# Patient Record
Sex: Female | Born: 1995 | Race: White | Hispanic: No | Marital: Single | Smoking: Current every day smoker
Health system: Southern US, Community
[De-identification: ages and names within clinical notes are randomized; demographics above are authoritative.]

## PROBLEM LIST (undated history)

## (undated) ENCOUNTER — Emergency Department (HOSPITAL_COMMUNITY): Payer: Medicaid Other | Source: Home / Self Care

## (undated) DIAGNOSIS — A749 Chlamydial infection, unspecified: Secondary | ICD-10-CM

## (undated) DIAGNOSIS — B1920 Unspecified viral hepatitis C without hepatic coma: Secondary | ICD-10-CM

## (undated) DIAGNOSIS — F99 Mental disorder, not otherwise specified: Secondary | ICD-10-CM

## (undated) DIAGNOSIS — F419 Anxiety disorder, unspecified: Secondary | ICD-10-CM

## (undated) DIAGNOSIS — M1990 Unspecified osteoarthritis, unspecified site: Secondary | ICD-10-CM

## (undated) HISTORY — PX: BLADDER SURGERY: SHX569

## (undated) HISTORY — DX: Mental disorder, not otherwise specified: F99

## (undated) HISTORY — DX: Unspecified viral hepatitis C without hepatic coma: B19.20

## (undated) HISTORY — PX: HERNIA REPAIR: SHX51

---
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
## 2013-05-06 ENCOUNTER — Encounter (HOSPITAL_COMMUNITY): Payer: Self-pay | Admitting: *Deleted

## 2013-05-06 ENCOUNTER — Emergency Department (HOSPITAL_COMMUNITY)
Admission: EM | Admit: 2013-05-06 | Discharge: 2013-05-06 | Disposition: A | Discharge status: Home / Self Care | Payer: Medicaid Other | Attending: Emergency Medicine | Admitting: Emergency Medicine

## 2013-05-06 DIAGNOSIS — R1013 Epigastric pain: Secondary | ICD-10-CM | POA: Insufficient documentation

## 2013-05-06 DIAGNOSIS — F17200 Nicotine dependence, unspecified, uncomplicated: Secondary | ICD-10-CM | POA: Insufficient documentation

## 2013-05-06 DIAGNOSIS — Z8739 Personal history of other diseases of the musculoskeletal system and connective tissue: Secondary | ICD-10-CM | POA: Insufficient documentation

## 2013-05-06 DIAGNOSIS — Z3202 Encounter for pregnancy test, result negative: Secondary | ICD-10-CM | POA: Insufficient documentation

## 2013-05-06 DIAGNOSIS — R6883 Chills (without fever): Secondary | ICD-10-CM | POA: Insufficient documentation

## 2013-05-06 DIAGNOSIS — Z8639 Personal history of other endocrine, nutritional and metabolic disease: Secondary | ICD-10-CM | POA: Insufficient documentation

## 2013-05-06 DIAGNOSIS — F411 Generalized anxiety disorder: Secondary | ICD-10-CM | POA: Insufficient documentation

## 2013-05-06 DIAGNOSIS — R1110 Vomiting, unspecified: Secondary | ICD-10-CM

## 2013-05-06 DIAGNOSIS — Z862 Personal history of diseases of the blood and blood-forming organs and certain disorders involving the immune mechanism: Secondary | ICD-10-CM | POA: Insufficient documentation

## 2013-05-06 LAB — CBC WITH DIFFERENTIAL/PLATELET
Eosinophils Absolute: 0.2 10*3/uL (ref 0.0–0.7)
Eosinophils Relative: 2 % (ref 0–5)
Hemoglobin: 15.7 g/dL — ABNORMAL HIGH (ref 12.0–15.0)
Lymphs Abs: 1.7 10*3/uL (ref 0.7–4.0)
MCH: 29.6 pg (ref 26.0–34.0)
MCHC: 35 g/dL (ref 30.0–36.0)
MCV: 84.5 fL (ref 78.0–100.0)
Monocytes Relative: 6 % (ref 3–12)
RBC: 5.3 MIL/uL — ABNORMAL HIGH (ref 3.87–5.11)

## 2013-05-06 LAB — URINALYSIS, ROUTINE W REFLEX MICROSCOPIC
Protein, ur: NEGATIVE mg/dL
Urobilinogen, UA: 0.2 mg/dL (ref 0.0–1.0)

## 2013-05-06 LAB — URINE MICROSCOPIC-ADD ON

## 2013-05-06 LAB — COMPREHENSIVE METABOLIC PANEL
BUN: 11 mg/dL (ref 6–23)
Calcium: 9.6 mg/dL (ref 8.4–10.5)
Creatinine, Ser: 0.75 mg/dL (ref 0.50–1.10)
GFR calc Af Amer: >90 mL/min (ref >90)
Glucose, Bld: 84 mg/dL (ref 70–99)
Total Protein: 8 g/dL (ref 6.0–8.3)

## 2013-05-06 LAB — LIPASE, BLOOD: Lipase: 20 U/L (ref 11–59)

## 2013-05-06 LAB — PREGNANCY, URINE: Preg Test, Ur: NEGATIVE

## 2013-05-06 MED ORDER — SODIUM CHLORIDE 0.9 % IV BOLUS (SEPSIS)
1000.0000 mL | Freq: Once | INTRAVENOUS | Status: AC
  Administered 2013-05-06: 1000 mL via INTRAVENOUS

## 2013-05-06 MED ORDER — RANITIDINE HCL 150 MG PO CAPS: 150.0000 mg | ORAL_CAPSULE | Freq: Two times a day (BID) | ORAL | Status: DC

## 2013-05-06 MED ORDER — ONDANSETRON HCL 4 MG/2ML IJ SOLN
4.0000 mg | Freq: Once | INTRAMUSCULAR | Status: AC
  Administered 2013-05-06: 4 mg via INTRAVENOUS
  Filled 2013-05-06: qty 2

## 2013-05-06 MED ORDER — PROMETHAZINE HCL 25 MG PO TABS: 25.0000 mg | ORAL_TABLET | Freq: Four times a day (QID) | ORAL | Status: DC | PRN

## 2013-05-06 MED ORDER — PANTOPRAZOLE SODIUM 40 MG IV SOLR
40.0000 mg | Freq: Once | INTRAVENOUS | Status: AC
  Administered 2013-05-06: 40 mg via INTRAVENOUS
  Filled 2013-05-06: qty 40

## 2013-05-06 NOTE — ED Provider Notes (Signed)
CSN: [id number]     Arrival date & time 05/06/13  1411 History    This chart was scribed for [name], MD by Blanchard Kelch, ED Scribe. The patient was seen in room APA12/APA12. Patient's care was started at 2:30 PM.     Chief Complaint  Patient presents with  . Emesis   Patient is a 17 y.o. female presenting with vomiting.  Emesis Severity:  Moderate Duration:  1 day Quality:  Stomach contents Progression:  Unchanged Chronicity:  New Context comment:  Began this morning after eating steak last night Associated symptoms: abdominal pain and chills   Associated symptoms: no diarrhea and no fever     HPI Comments: Jacqueline Ellison is a 17 y.o. female who presents to the Emergency Department complaining of constant, moderate emesis with associated mild epigastric abdominal pain and chills that began this morning. Patient ate a steak last night and believes that is the cause for emesis. She denies any other contacts eating the same steak. Patient denies diarrhea or fever. Patient has not had a regular menstrual period in four years.    Past Medical History  Diagnosis Date  . Anxiety   . Arthritis   . Thyroid disease    Past Surgical History  Procedure Laterality Date  . Hernia repair     Family History  Problem Relation Age of Onset  . Hypertension Mother   . Diabetes Mother    History  Substance Use Topics  . Smoking status: Current Every Day Smoker  . Smokeless tobacco: Not on file  . Alcohol Use: No   OB History   Grav Para Term Preterm Abortions TAB SAB Ect Mult Living                 Review of Systems  Constitutional: Positive for chills. Negative for fever, appetite change and fatigue.  HENT: Negative for congestion, sinus pressure and ear discharge.   Eyes: Negative for discharge.  Respiratory: Negative for cough.   Cardiovascular: Negative for chest pain.  Gastrointestinal: Positive for vomiting and abdominal pain. Negative for diarrhea.   Genitourinary: Negative for frequency and hematuria.  Musculoskeletal: Negative for back pain.  Skin: Negative for rash.  Neurological: Negative for seizures.  Psychiatric/Behavioral: Negative for hallucinations.    Allergies  Review of patient's allergies indicates no known allergies.  Home Medications   Current Outpatient Rx  Name  Route  Sig  Dispense  Refill  . ALPRAZolam (XANAX) 0.25 MG tablet   Oral   Take 0.25 mg by mouth daily as needed. Nerves/anxiety          Triage Vitals: BP 119/71  Pulse 68  Temp(Src) 97.4 F (36.3 C) (Oral)  Resp 18  Ht 5\' 1"  (1.549 m)  Wt 118 lb (53.524 kg)  BMI 22.31 kg/m2  SpO2 99%  Physical Exam  Nursing note and vitals reviewed. Constitutional: She is oriented to person, place, and time. She appears well-developed.  HENT:  Head: Normocephalic.  Eyes: Conjunctivae and EOM are normal. No scleral icterus.  Neck: Neck supple. No thyromegaly present.  Cardiovascular: Normal rate and regular rhythm.  Exam reveals no gallop and no friction rub.   No murmur heard. Pulmonary/Chest: No stridor. She has no wheezes. She has no rales. She exhibits no tenderness.  Abdominal: She exhibits no distension. There is tenderness. There is no rebound.  Minimal epigastric tenderness  Musculoskeletal: Normal range of motion. She exhibits no edema.  Lymphadenopathy:    She has no  cervical adenopathy.  Neurological: She is oriented to person, place, and time. Coordination normal.  Skin: No rash noted. No erythema.  Psychiatric: She has a normal mood and affect. Her behavior is normal.    ED Course   DIAGNOSTIC STUDIES:  Oxygen Saturation is 99% on room air, normal by my interpretation.    COORDINATION OF CARE:  2:32 PM - Patient verbalizes understanding and agrees with treatment plan.   Procedures (including critical care time)  Labs Reviewed - No data to display No results found. No diagnosis found.  MDM   The chart was scribed for me  under my direct supervision.  I personally performed the history, physical, and medical decision making and all procedures in the evaluation of this patient.[name], MD 05/06/13 [phone]

## 2013-05-06 NOTE — ED Notes (Signed)
Pt's family member at desk stating pt is feeling better and wants to go home. EDP aware.

## 2013-05-06 NOTE — ED Notes (Signed)
Epigastric pain with vomiting starting this morning.  Denies diarrhea.

## 2013-05-08 LAB — URINE CULTURE: Colony Count: 75000

## 2013-05-28 ENCOUNTER — Encounter (HOSPITAL_COMMUNITY): Payer: Self-pay | Admitting: Emergency Medicine

## 2013-05-28 ENCOUNTER — Emergency Department (HOSPITAL_COMMUNITY)
Admission: EM | Admit: 2013-05-28 | Discharge: 2013-05-28 | Disposition: A | Discharge status: Home / Self Care | Payer: Medicaid Other | Attending: Emergency Medicine | Admitting: Emergency Medicine

## 2013-05-28 DIAGNOSIS — Z8639 Personal history of other endocrine, nutritional and metabolic disease: Secondary | ICD-10-CM | POA: Insufficient documentation

## 2013-05-28 DIAGNOSIS — N390 Urinary tract infection, site not specified: Secondary | ICD-10-CM

## 2013-05-28 DIAGNOSIS — N898 Other specified noninflammatory disorders of vagina: Secondary | ICD-10-CM | POA: Insufficient documentation

## 2013-05-28 DIAGNOSIS — R350 Frequency of micturition: Secondary | ICD-10-CM | POA: Insufficient documentation

## 2013-05-28 DIAGNOSIS — F17200 Nicotine dependence, unspecified, uncomplicated: Secondary | ICD-10-CM | POA: Insufficient documentation

## 2013-05-28 DIAGNOSIS — R3915 Urgency of urination: Secondary | ICD-10-CM | POA: Insufficient documentation

## 2013-05-28 DIAGNOSIS — Z8659 Personal history of other mental and behavioral disorders: Secondary | ICD-10-CM | POA: Insufficient documentation

## 2013-05-28 DIAGNOSIS — Z8739 Personal history of other diseases of the musculoskeletal system and connective tissue: Secondary | ICD-10-CM | POA: Insufficient documentation

## 2013-05-28 DIAGNOSIS — Z9889 Other specified postprocedural states: Secondary | ICD-10-CM | POA: Insufficient documentation

## 2013-05-28 DIAGNOSIS — Z79899 Other long term (current) drug therapy: Secondary | ICD-10-CM | POA: Insufficient documentation

## 2013-05-28 DIAGNOSIS — Z3202 Encounter for pregnancy test, result negative: Secondary | ICD-10-CM | POA: Insufficient documentation

## 2013-05-28 DIAGNOSIS — Z862 Personal history of diseases of the blood and blood-forming organs and certain disorders involving the immune mechanism: Secondary | ICD-10-CM | POA: Insufficient documentation

## 2013-05-28 LAB — URINALYSIS, ROUTINE W REFLEX MICROSCOPIC
Glucose, UA: NEGATIVE mg/dL
Specific Gravity, Urine: 1.025 (ref 1.005–1.030)
pH: 7 (ref 5.0–8.0)

## 2013-05-28 LAB — URINE MICROSCOPIC-ADD ON

## 2013-05-28 LAB — POCT PREGNANCY, URINE: Preg Test, Ur: NEGATIVE

## 2013-05-28 MED ORDER — HYDROCODONE-ACETAMINOPHEN 5-325 MG PO TABS
1.0000 | ORAL_TABLET | Freq: Once | ORAL | Status: AC
  Administered 2013-05-28: 1 via ORAL
  Filled 2013-05-28: qty 1

## 2013-05-28 MED ORDER — PHENAZOPYRIDINE HCL 100 MG PO TABS
200.0000 mg | ORAL_TABLET | Freq: Once | ORAL | Status: AC
  Administered 2013-05-28: 200 mg via ORAL
  Filled 2013-05-28: qty 2

## 2013-05-28 MED ORDER — CIPROFLOXACIN HCL 500 MG PO TABS: 500.0000 mg | ORAL_TABLET | Freq: Two times a day (BID) | ORAL | Status: DC

## 2013-05-28 MED ORDER — PHENAZOPYRIDINE HCL 200 MG PO TABS: 200.0000 mg | ORAL_TABLET | Freq: Three times a day (TID) | ORAL | Status: DC

## 2013-05-28 NOTE — ED Provider Notes (Signed)
CSN: [id number]     Arrival date & time 05/28/13  1116 History   First MD Initiated Contact with Patient 05/28/13 1128     Chief Complaint  Patient presents with  . Dysuria   (Consider location/radiation/quality/duration/timing/severity/associated sxs/prior Treatment) Patient is a 17 y.o. female presenting with dysuria. The history is provided by the patient.  Dysuria Pain quality:  Burning Pain severity:  Moderate Onset quality:  Gradual Duration:  6 hours Timing:  Intermittent Progression:  Worsening Chronicity:  New Recent urinary tract infections: no   Relieved by:  Nothing Worsened by:  Nothing tried Ineffective treatments:  None tried Urinary symptoms: frequent urination and hesitancy   Associated symptoms: abdominal pain   Associated symptoms: no fever, no nausea and no vomiting   Risk factors: sexually active   Risk factors: not pregnant, no recurrent urinary tract infections, no renal disease, not single kidney and no sexually transmitted infections    Jacqueline Ellison is a 17 y.o. female who presents to the ED with UTI symptoms that started this morning. Patient with burning, urgency, frequency but when she goes can only do small amount. Patient states she has not been sexually active in over a year.   Past Medical History  Diagnosis Date  . Anxiety   . Arthritis   . Thyroid disease    Past Surgical History  Procedure Laterality Date  . Hernia repair     Family History  Problem Relation Age of Onset  . Hypertension Mother   . Diabetes Mother    History  Substance Use Topics  . Smoking status: Current Every Day Smoker  . Smokeless tobacco: Not on file  . Alcohol Use: No   OB History   Grav Para Term Preterm Abortions TAB SAB Ect Mult Living                 Review of Systems  Constitutional: Negative for fever and chills.  HENT: Negative for congestion and neck pain.   Eyes: Negative for visual disturbance.  Respiratory: Negative for shortness of  breath.   Cardiovascular: Negative for chest pain.  Gastrointestinal: Positive for abdominal pain. Negative for nausea and vomiting.  Genitourinary: Positive for dysuria, urgency, frequency and vaginal bleeding.  Skin: Negative for rash.  Neurological: Negative for dizziness, seizures and headaches.  Psychiatric/Behavioral: The patient is not nervous/anxious.     Allergies  Review of patient's allergies indicates no known allergies.  Home Medications   Current Outpatient Rx  Name  Route  Sig  Dispense  Refill  . medroxyPROGESTERone (DEPO-PROVERA) 150 MG/ML injection   Intramuscular   Inject 150 mg into the muscle every 3 (three) months.         . promethazine (PHENERGAN) 25 MG tablet   Oral   Take 1 tablet (25 mg total) by mouth every 6 (six) hours as needed for nausea.   15 tablet   0   . ranitidine (ZANTAC) 150 MG capsule   Oral   Take 1 capsule (150 mg total) by mouth 2 (two) times daily.   20 capsule   0    BP 110/85  Pulse 90  Temp(Src) 98.6 F (37 C) (Oral)  Resp 19  Ht 5\' 1"  (1.549 m)  Wt 118 lb (53.524 kg)  BMI 22.31 kg/m2  LMP 05/28/2013 Physical Exam  Nursing note and vitals reviewed. Constitutional: She is oriented to person, place, and time. She appears well-developed and well-nourished. No distress.  HENT:  Head: Normocephalic and atraumatic.  Eyes: EOM are normal.  Neck: Neck supple.  Cardiovascular: Normal rate, regular rhythm and normal heart sounds.   Pulmonary/Chest: Effort normal and breath sounds normal.  Abdominal: Soft. Bowel sounds are normal. There is tenderness in the suprapubic area. There is no rigidity, no rebound, no guarding and no CVA tenderness.  Musculoskeletal: Normal range of motion.  Neurological: She is alert and oriented to person, place, and time. No cranial nerve deficit.  Skin: Skin is warm and dry.  Psychiatric: She has a normal mood and affect. Her behavior is normal.    ED Course  Procedures Imaging  Review Results for orders placed during the hospital encounter of 05/28/13 (from the past 24 hour(s))  URINALYSIS, ROUTINE W REFLEX MICROSCOPIC     Status: Abnormal   Collection Time    05/28/13 11:37 AM      Result Value Range   Color, Urine BROWN (*) YELLOW   APPearance CLOUDY (*) CLEAR   Specific Gravity, Urine 1.025  1.005 - 1.030   pH 7.0  5.0 - 8.0   Glucose, UA NEGATIVE  NEGATIVE mg/dL   Hgb urine dipstick LARGE (*) NEGATIVE   Bilirubin Urine SMALL (*) NEGATIVE   Ketones, ur NEGATIVE  NEGATIVE mg/dL   Protein, ur >161 (*) NEGATIVE mg/dL   Urobilinogen, UA 1.0  0.0 - 1.0 mg/dL   Nitrite NEGATIVE  NEGATIVE   Leukocytes, UA MODERATE (*) NEGATIVE  URINE MICROSCOPIC-ADD ON     Status: Abnormal   Collection Time    05/28/13 11:37 AM      Result Value Range   Squamous Epithelial / LPF FEW (*) RARE   WBC, UA TOO NUMEROUS TO COUNT  <3 WBC/hpf   RBC / HPF 21-50  <3 RBC/hpf   Bacteria, UA MANY (*) RARE  POCT PREGNANCY, URINE     Status: None   Collection Time    05/28/13 11:45 AM      Result Value Range   Preg Test, Ur NEGATIVE  NEGATIVE     MDM  17 y.o. female with dysuria, frequency and urgency that started this morning. Will treat UTI with antibiotics and urinary analgesic. Urine sent for culture.  Discussed with the patient clinical and lab findings and all questioned fully answered. She will return if any problems arise or symptoms worsen. Patient stable for discharge home without any immediate complications.      Medication List    TAKE these medications       ciprofloxacin 500 MG tablet  Commonly known as:  CIPRO  Take 1 tablet (500 mg total) by mouth every 12 (twelve) hours.     phenazopyridine 200 MG tablet  Commonly known as:  PYRIDIUM  Take 1 tablet (200 mg total) by mouth 3 (three) times daily.      ASK your doctor about these medications       medroxyPROGESTERone 150 MG/ML injection  Commonly known as:  DEPO-PROVERA  Inject 150 mg into the muscle every  3 (three) months.     promethazine 25 MG tablet  Commonly known as:  PHENERGAN  Take 1 tablet (25 mg total) by mouth every 6 (six) hours as needed for nausea.     ranitidine 150 MG capsule  Commonly known as:  ZANTAC  Take 1 capsule (150 mg total) by mouth 2 (two) times daily.         Janne Napoleon, [place] 05/28/13 1227

## 2013-05-28 NOTE — ED Provider Notes (Signed)
Medical screening examination/treatment/procedure(s) were performed by non-physician practitioner and as supervising physician I was immediately available for consultation/collaboration.   Celene Kras, MD 05/28/13 [phone]

## 2013-05-28 NOTE — ED Notes (Signed)
Sudden suprapubic pain with urinary frequency, small amounts and constantly feeling like she has to urinate started this am. Nad. Pain now and with urination

## 2013-05-31 LAB — URINE CULTURE

## 2013-07-13 ENCOUNTER — Emergency Department (HOSPITAL_COMMUNITY)
Admission: EM | Admit: 2013-07-13 | Discharge: 2013-07-13 | Disposition: A | Discharge status: Home / Self Care | Payer: Medicaid Other | Attending: Emergency Medicine | Admitting: Emergency Medicine

## 2013-07-13 ENCOUNTER — Encounter (HOSPITAL_COMMUNITY): Payer: Self-pay | Admitting: Emergency Medicine

## 2013-07-13 DIAGNOSIS — N949 Unspecified condition associated with female genital organs and menstrual cycle: Secondary | ICD-10-CM | POA: Insufficient documentation

## 2013-07-13 DIAGNOSIS — Z8639 Personal history of other endocrine, nutritional and metabolic disease: Secondary | ICD-10-CM | POA: Insufficient documentation

## 2013-07-13 DIAGNOSIS — Z79899 Other long term (current) drug therapy: Secondary | ICD-10-CM | POA: Insufficient documentation

## 2013-07-13 DIAGNOSIS — F17200 Nicotine dependence, unspecified, uncomplicated: Secondary | ICD-10-CM | POA: Insufficient documentation

## 2013-07-13 DIAGNOSIS — Z8739 Personal history of other diseases of the musculoskeletal system and connective tissue: Secondary | ICD-10-CM | POA: Insufficient documentation

## 2013-07-13 DIAGNOSIS — N938 Other specified abnormal uterine and vaginal bleeding: Secondary | ICD-10-CM | POA: Insufficient documentation

## 2013-07-13 DIAGNOSIS — Z862 Personal history of diseases of the blood and blood-forming organs and certain disorders involving the immune mechanism: Secondary | ICD-10-CM | POA: Insufficient documentation

## 2013-07-13 DIAGNOSIS — N939 Abnormal uterine and vaginal bleeding, unspecified: Secondary | ICD-10-CM

## 2013-07-13 DIAGNOSIS — Z8659 Personal history of other mental and behavioral disorders: Secondary | ICD-10-CM | POA: Insufficient documentation

## 2013-07-13 LAB — CBC WITH DIFFERENTIAL/PLATELET
Basophils Relative: 1 % (ref 0–1)
Eosinophils Absolute: 0.3 10*3/uL (ref 0.0–0.7)
Lymphs Abs: 2.3 10*3/uL (ref 0.7–4.0)
MCH: 31 pg (ref 26.0–34.0)
Neutro Abs: 3.1 10*3/uL (ref 1.7–7.7)
Neutrophils Relative %: 51 % (ref 43–77)
Platelets: 232 10*3/uL (ref 150–400)
RBC: 4.71 MIL/uL (ref 3.87–5.11)
WBC: 6.1 10*3/uL (ref 4.0–10.5)

## 2013-07-13 LAB — HCG, QUANTITATIVE, PREGNANCY: hCG, Beta Chain, Quant, S: <1 m[IU]/mL (ref <5)

## 2013-07-13 LAB — WET PREP, GENITAL: Trich, Wet Prep: NONE SEEN

## 2013-07-13 MED ORDER — MEDROXYPROGESTERONE ACETATE 150 MG/ML IM SUSP
150.0000 mg | Freq: Once | INTRAMUSCULAR | Status: AC
  Administered 2013-07-13: 150 mg via INTRAMUSCULAR
  Filled 2013-07-13: qty 1

## 2013-07-13 NOTE — ED Provider Notes (Signed)
Medical screening examination/treatment/procedure(s) were performed by non-physician practitioner and as supervising physician I was immediately available for consultation/collaboration.  EKG Interpretation   None         [name], MD 07/13/13 1547

## 2013-07-13 NOTE — ED Notes (Signed)
Pt reports missed her birth control shot 4months ago and has vaginal bleeding for the past 2 months.  Pt says can't see her gynecologist until Dec 1.   Denies any abd pain.  Pt says went through 18 tampons in 2 days.

## 2013-07-13 NOTE — ED Provider Notes (Signed)
CSN: [id number]     Arrival date & time 07/13/13  1151 History   First MD Initiated Contact with Patient 07/13/13 1248     Chief Complaint  Patient presents with  . Vaginal Bleeding   (Consider location/radiation/quality/duration/timing/severity/associated sxs/prior Treatment) Patient is a 17 y.o. female presenting with vaginal bleeding. The history is provided by the patient.  Vaginal Bleeding Quality:  Bright red Severity:  Severe Onset quality:  Gradual Duration:  2 months Timing:  Constant Progression:  Unchanged Chronicity:  New Number of pads used:  18 tampons in 2 days Possible pregnancy: no   Relieved by:  Nothing Worsened by:  Nothing tried  Jacqueline Ellison is a 17 y.o. female who presents to the ED with vaginal bleeding. She usually get Depo Provera for birth control but missed her last injection that was due 4 months ago. For the past 2 months she has had vaginal bleeding. She has an appointment with the GYN Dec. 1st but decided the bleeding was to heavy to wait that long. She has been getting Depo Provera x 4 years and during that time has had no bleeding.  Past Medical History  Diagnosis Date  . Anxiety   . Arthritis   . Thyroid disease    Past Surgical History  Procedure Laterality Date  . Hernia repair     Family History  Problem Relation Age of Onset  . Hypertension Mother   . Diabetes Mother    History  Substance Use Topics  . Smoking status: Current Every Day Smoker  . Smokeless tobacco: Not on file  . Alcohol Use: No   OB History   Grav Para Term Preterm Abortions TAB SAB Ect Mult Living                 Review of Systems  Genitourinary: Positive for vaginal bleeding.  10 other systems reviewed and all negative  Allergies  Review of patient's allergies indicates no known allergies.  Home Medications   Current Outpatient Rx  Name  Route  Sig  Dispense  Refill  . medroxyPROGESTERone (DEPO-PROVERA) 150 MG/ML injection   Intramuscular  Inject 150 mg into the muscle every 3 (three) months.          BP 108/79  Pulse 73  Temp(Src) 98.7 F (37.1 C) (Oral)  Resp 18  Ht 5\' 1"  (1.549 m)  Wt 123 lb (55.792 kg)  BMI 23.25 kg/m2  SpO2 100%  LMP 04/15/2013 Physical Exam  Nursing note and vitals reviewed. Constitutional: She is oriented to person, place, and time. She appears well-developed and well-nourished. No distress.  HENT:  Head: Normocephalic.  Eyes: EOM are normal.  Neck: Neck supple.  Cardiovascular: Normal rate.   Pulmonary/Chest: Effort normal.  Abdominal: Soft. There is no tenderness.  Genitourinary:  External genitalia without lesions. Small blood vaginal vault. No CMT, no adnexal tenderness. Uterus without palpable enlargement.   Musculoskeletal: Normal range of motion.  Neurological: She is alert and oriented to person, place, and time. No cranial nerve deficit.  Skin: Skin is warm and dry.  Psychiatric: She has a normal mood and affect. Her behavior is normal.    ED Course  Procedures Results for orders placed during the hospital encounter of 07/13/13 (from the past 24 hour(s))  CBC WITH DIFFERENTIAL     Status: None   Collection Time    07/13/13 12:53 PM      Result Value Range   WBC 6.1  4.0 - 10.5 K/uL  RBC 4.71  3.87 - 5.11 MIL/uL   Hemoglobin 14.6  12.0 - 15.0 g/dL   HCT 16.1  09.6 - 04.5 %   MCV 87.5  78.0 - 100.0 fL   MCH 31.0  26.0 - 34.0 pg   MCHC 35.4  30.0 - 36.0 g/dL   RDW 40.9  81.1 - 91.4 %   Platelets 232  150 - 400 K/uL   Neutrophils Relative % 51  43 - 77 %   Neutro Abs 3.1  1.7 - 7.7 K/uL   Lymphocytes Relative 37  12 - 46 %   Lymphs Abs 2.3  0.7 - 4.0 K/uL   Monocytes Relative 7  3 - 12 %   Monocytes Absolute 0.4  0.1 - 1.0 K/uL   Eosinophils Relative 5  0 - 5 %   Eosinophils Absolute 0.3  0.0 - 0.7 K/uL   Basophils Relative 1  0 - 1 %   Basophils Absolute 0.1  0.0 - 0.1 K/uL  HCG, QUANTITATIVE, PREGNANCY     Status: None   Collection Time    07/13/13 12:58 PM       Result Value Range   hCG, Beta Chain, Quant, S <1  <5 mIU/mL  WET PREP, GENITAL     Status: Abnormal   Collection Time    07/13/13  1:39 PM      Result Value Range   Yeast Wet Prep HPF POC NONE SEEN  NONE SEEN   Trich, Wet Prep NONE SEEN  NONE SEEN   Clue Cells Wet Prep HPF POC RARE (*) NONE SEEN   WBC, Wet Prep HPF POC MODERATE (*) NONE SEEN    MDM   1. Abnormal vaginal bleeding    17 y.o. female with vaginal bleeding x 2 months after missing her last Depo Provera injection. Will give her Depo Provera 150 mg. IM today and she will keep her appointment with her GYN for Dec. 1st. Patient stable for discharge home without any immediate complications. Vital signs normal and Hgb 14.6 I have reviewed this patient's vital signs, nurses notes, appropriate labs and imaging.  I have discussed findings and plan of care with the patient and she voices understanding.     [facility] Orlene Och, NP 07/13/13 1400

## 2013-07-14 LAB — GC/CHLAMYDIA PROBE AMP: CT Probe RNA: NEGATIVE

## 2014-03-22 ENCOUNTER — Emergency Department (HOSPITAL_COMMUNITY)
Admission: EM | Admit: 2014-03-22 | Discharge: 2014-03-22 | Disposition: A | Discharge status: Home / Self Care | Payer: Medicaid Other | Attending: Emergency Medicine | Admitting: Emergency Medicine

## 2014-03-22 ENCOUNTER — Encounter (HOSPITAL_COMMUNITY): Payer: Self-pay | Admitting: Emergency Medicine

## 2014-03-22 DIAGNOSIS — F17200 Nicotine dependence, unspecified, uncomplicated: Secondary | ICD-10-CM | POA: Insufficient documentation

## 2014-03-22 DIAGNOSIS — Z8659 Personal history of other mental and behavioral disorders: Secondary | ICD-10-CM | POA: Insufficient documentation

## 2014-03-22 DIAGNOSIS — Z862 Personal history of diseases of the blood and blood-forming organs and certain disorders involving the immune mechanism: Secondary | ICD-10-CM | POA: Insufficient documentation

## 2014-03-22 DIAGNOSIS — Z8639 Personal history of other endocrine, nutritional and metabolic disease: Secondary | ICD-10-CM | POA: Insufficient documentation

## 2014-03-22 DIAGNOSIS — Z3202 Encounter for pregnancy test, result negative: Secondary | ICD-10-CM | POA: Insufficient documentation

## 2014-03-22 DIAGNOSIS — N390 Urinary tract infection, site not specified: Secondary | ICD-10-CM

## 2014-03-22 DIAGNOSIS — Z8739 Personal history of other diseases of the musculoskeletal system and connective tissue: Secondary | ICD-10-CM | POA: Insufficient documentation

## 2014-03-22 DIAGNOSIS — Z79899 Other long term (current) drug therapy: Secondary | ICD-10-CM | POA: Insufficient documentation

## 2014-03-22 LAB — URINALYSIS, ROUTINE W REFLEX MICROSCOPIC
Glucose, UA: NEGATIVE mg/dL
Leukocytes, UA: NEGATIVE
NITRITE: NEGATIVE
PROTEIN: 30 mg/dL — AB
UROBILINOGEN UA: 0.2 mg/dL (ref 0.0–1.0)
pH: 5.5 (ref 5.0–8.0)

## 2014-03-22 LAB — URINE MICROSCOPIC-ADD ON

## 2014-03-22 LAB — POC URINE PREG, ED: Preg Test, Ur: NEGATIVE

## 2014-03-22 MED ORDER — ONDANSETRON 8 MG PO TBDP: 8.0000 mg | ORAL_TABLET | Freq: Three times a day (TID) | ORAL | Status: DC | PRN

## 2014-03-22 MED ORDER — ONDANSETRON 8 MG PO TBDP
8.0000 mg | ORAL_TABLET | Freq: Once | ORAL | Status: AC
  Administered 2014-03-22: 8 mg via ORAL
  Filled 2014-03-22: qty 1

## 2014-03-22 MED ORDER — CEPHALEXIN 500 MG PO CAPS: 500.0000 mg | ORAL_CAPSULE | Freq: Four times a day (QID) | ORAL | Status: DC

## 2014-03-22 NOTE — ED Notes (Signed)
Pt reports she is was at work and felt very hot, began vomiting. Pt is denying any emesis at present but states she is till very nauseous. States she has cooled off. Denies any pain or urinary symptoms. States she was dx with a UTI x 2 weeks ago but did not get her antibiotic filled.

## 2014-03-22 NOTE — ED Notes (Signed)
Pt reports got very hot a work and started vomiting.  Reports left work early and needs a work excuse.  Pt says still nauseated and dry heaving.  Denies pain.  Denies diarrhea.

## 2014-03-22 NOTE — ED Notes (Signed)
MD at bedside. 

## 2014-03-22 NOTE — ED Provider Notes (Signed)
CSN: [id number]     Arrival date & time 03/22/14  1851 History   First MD Initiated Contact with Patient 03/22/14 1927     Chief Complaint  Patient presents with  . Emesis     (Consider location/radiation/quality/duration/timing/severity/associated sxs/prior Treatment) Patient is a 18 y.o. female presenting with vomiting. The history is provided by the patient.  Emesis  is in here complaining of nausea and vomiting after being exposed to heat at work. Patient states that she has not been drinking adequate liquids and also has not had adequate 2 today. Denies any fever or chills. No abdominal pain. No urinary symptoms. Felt better when she went outside. No treatment used prior to arrival. No prior history of same. Denies any dark urine.  Past Medical History  Diagnosis Date  . Anxiety   . Arthritis   . Thyroid disease    Past Surgical History  Procedure Laterality Date  . Hernia repair     Family History  Problem Relation Age of Onset  . Hypertension Mother   . Diabetes Mother    History  Substance Use Topics  . Smoking status: Current Every Day Smoker  . Smokeless tobacco: Not on file  . Alcohol Use: No   OB History   Grav Para Term Preterm Abortions TAB SAB Ect Mult Living                 Review of Systems  Gastrointestinal: Positive for vomiting.  All other systems reviewed and are negative.     Allergies  Review of patient's allergies indicates no known allergies.  Home Medications   Prior to Admission medications   Medication Sig Start Date End Date Taking? Authorizing Provider  medroxyPROGESTERone (DEPO-PROVERA) 150 MG/ML injection Inject 150 mg into the muscle every 3 (three) months.    Historical Provider, MD   BP 120/84  Pulse 99  Temp(Src) 98.1 F (36.7 C) (Oral)  Resp 24  Ht 5\' 1"  (1.549 m)  Wt 120 lb (54.432 kg)  BMI 22.69 kg/m2  SpO2 98%  LMP 03/08/2014 Physical Exam  Nursing note and vitals reviewed. Constitutional: She is oriented to  person, place, and time. She appears well-developed and well-nourished.  Non-toxic appearance. No distress.  HENT:  Head: Normocephalic and atraumatic.  Eyes: Conjunctivae, EOM and lids are normal. Pupils are equal, round, and reactive to light.  Neck: Normal range of motion. Neck supple. No tracheal deviation present. No mass present.  Cardiovascular: Normal rate, regular rhythm and normal heart sounds.  Exam reveals no gallop.   No murmur heard. Pulmonary/Chest: Effort normal and breath sounds normal. No stridor. No respiratory distress. She has no decreased breath sounds. She has no wheezes. She has no rhonchi. She has no rales.  Abdominal: Soft. Normal appearance and bowel sounds are normal. She exhibits no distension. There is no tenderness. There is no rebound and no CVA tenderness.  Musculoskeletal: Normal range of motion. She exhibits no edema and no tenderness.  Neurological: She is alert and oriented to person, place, and time. She has normal strength. No cranial nerve deficit or sensory deficit. GCS eye subscore is 4. GCS verbal subscore is 5. GCS motor subscore is 6.  Skin: Skin is warm and dry. No abrasion and no rash noted.  Psychiatric: She has a normal mood and affect. Her speech is normal and behavior is normal.    ED Course  Procedures (including critical care time) Labs Review Labs Reviewed  URINALYSIS, ROUTINE W REFLEX MICROSCOPIC  POC  URINE PREG, ED    Imaging Review No results found.   EKG Interpretation None      MDM   Final diagnoses:  None    Patient states that she has no urinary tract infection and that she did not get antibiotics filled. The urinary tract infection remains and we'll treat her for this. She was counseled about going to [facility] to use the $4 prescription plan   Toy BakerAnthony T Terrisa Curfman, MD 03/22/14 2036

## 2014-03-22 NOTE — Discharge Instructions (Signed)

## 2014-05-06 ENCOUNTER — Emergency Department (HOSPITAL_COMMUNITY)
Admission: EM | Admit: 2014-05-06 | Discharge: 2014-05-06 | Disposition: A | Discharge status: Home / Self Care | Payer: Medicaid Other | Attending: Emergency Medicine | Admitting: Emergency Medicine

## 2014-05-06 ENCOUNTER — Encounter (HOSPITAL_COMMUNITY): Payer: Self-pay | Admitting: Emergency Medicine

## 2014-05-06 DIAGNOSIS — N760 Acute vaginitis: Secondary | ICD-10-CM | POA: Insufficient documentation

## 2014-05-06 DIAGNOSIS — N939 Abnormal uterine and vaginal bleeding, unspecified: Secondary | ICD-10-CM

## 2014-05-06 DIAGNOSIS — Z9889 Other specified postprocedural states: Secondary | ICD-10-CM | POA: Insufficient documentation

## 2014-05-06 DIAGNOSIS — Z3202 Encounter for pregnancy test, result negative: Secondary | ICD-10-CM | POA: Insufficient documentation

## 2014-05-06 DIAGNOSIS — Z8659 Personal history of other mental and behavioral disorders: Secondary | ICD-10-CM | POA: Insufficient documentation

## 2014-05-06 DIAGNOSIS — N898 Other specified noninflammatory disorders of vagina: Secondary | ICD-10-CM | POA: Insufficient documentation

## 2014-05-06 DIAGNOSIS — A499 Bacterial infection, unspecified: Secondary | ICD-10-CM | POA: Insufficient documentation

## 2014-05-06 DIAGNOSIS — Z8739 Personal history of other diseases of the musculoskeletal system and connective tissue: Secondary | ICD-10-CM | POA: Insufficient documentation

## 2014-05-06 DIAGNOSIS — Z8639 Personal history of other endocrine, nutritional and metabolic disease: Secondary | ICD-10-CM | POA: Insufficient documentation

## 2014-05-06 DIAGNOSIS — B9689 Other specified bacterial agents as the cause of diseases classified elsewhere: Secondary | ICD-10-CM | POA: Insufficient documentation

## 2014-05-06 DIAGNOSIS — Z862 Personal history of diseases of the blood and blood-forming organs and certain disorders involving the immune mechanism: Secondary | ICD-10-CM | POA: Insufficient documentation

## 2014-05-06 LAB — CBC WITH DIFFERENTIAL/PLATELET
BASOS PCT: 1 % (ref 0–1)
Basophils Absolute: 0 10*3/uL (ref 0.0–0.1)
Eosinophils Absolute: 0.2 10*3/uL (ref 0.0–0.7)
Eosinophils Relative: 4 % (ref 0–5)
HEMATOCRIT: 36.5 % (ref 36.0–46.0)
HEMOGLOBIN: 12.4 g/dL (ref 12.0–15.0)
LYMPHS ABS: 2.2 10*3/uL (ref 0.7–4.0)
LYMPHS PCT: 43 % (ref 12–46)
MCH: 29.9 pg (ref 26.0–34.0)
MCHC: 34 g/dL (ref 30.0–36.0)
MCV: 88 fL (ref 78.0–100.0)
MONO ABS: 0.4 10*3/uL (ref 0.1–1.0)
MONOS PCT: 7 % (ref 3–12)
NEUTROS ABS: 2.3 10*3/uL (ref 1.7–7.7)
NEUTROS PCT: 45 % (ref 43–77)
Platelets: 210 10*3/uL (ref 150–400)
RBC: 4.15 MIL/uL (ref 3.87–5.11)
RDW: 12.4 % (ref 11.5–15.5)
WBC: 5.1 10*3/uL (ref 4.0–10.5)

## 2014-05-06 LAB — WET PREP, GENITAL
Clue Cells Wet Prep HPF POC: NONE SEEN
TRICH WET PREP: NONE SEEN
YEAST WET PREP: NONE SEEN

## 2014-05-06 LAB — PREGNANCY, URINE: PREG TEST UR: NEGATIVE

## 2014-05-06 MED ORDER — ONDANSETRON 4 MG PO TBDP
ORAL_TABLET | ORAL | Status: AC
  Filled 2014-05-06: qty 1

## 2014-05-06 MED ORDER — IBUPROFEN 600 MG PO TABS: 600.0000 mg | ORAL_TABLET | Freq: Four times a day (QID) | ORAL | Status: DC | PRN

## 2014-05-06 MED ORDER — METRONIDAZOLE 500 MG PO TABS: 500.0000 mg | ORAL_TABLET | Freq: Two times a day (BID) | ORAL | Status: DC

## 2014-05-06 MED ORDER — HYDROCODONE-ACETAMINOPHEN 5-325 MG PO TABS
1.0000 | ORAL_TABLET | Freq: Once | ORAL | Status: AC
  Administered 2014-05-06: 1 via ORAL
  Filled 2014-05-06: qty 1

## 2014-05-06 MED ORDER — ONDANSETRON 4 MG PO TBDP
4.0000 mg | ORAL_TABLET | Freq: Once | ORAL | Status: AC
  Administered 2014-05-06: 4 mg via ORAL

## 2014-05-06 NOTE — ED Provider Notes (Signed)
Medical screening examination/treatment/procedure(s) were conducted as a shared visit with non-physician practitioner(s) and myself.  I personally evaluated the patient during the encounter.   EKG Interpretation None       Juliet RudeNathan R. Rubin PayorPickering, MD 05/06/14 [phone]

## 2014-05-06 NOTE — ED Provider Notes (Signed)
CSN: [id number]     Arrival date & time 05/06/14  4098 History  This chart was scribed for [facility] R. Rubin Payor, MD by Jarvis Morgan, ED Scribe. This patient was seen in room APA15/APA15 and the patient's care was started at 10:14 AM.    Chief Complaint  Patient presents with  . Vaginal Bleeding     The history is provided by the patient. No language interpreter was used.   HPI Comments: Jacqueline Ellison is a 18 y.o. female with a h/o arthritis, anxiety and thyroid disease who presents to the Emergency Department complaining of increased vaginal bleeding and cramps that started about 4 days ago. She states this is about 3 days earlier than her menstrual period. She states she is noticing the heavy bleeding when she uses the restroom and states that the blood has been filling up the toilet bowl. She notes she has been wearing pads and having to change them every 15 minutes. She reports associated lightheadedness, dizziness, and abdominal pain. Taking BC pills. She denies any bleeding anywhere else.    Past Medical History  Diagnosis Date  . Anxiety   . Arthritis   . Thyroid disease    Past Surgical History  Procedure Laterality Date  . Hernia repair     Family History  Problem Relation Age of Onset  . Hypertension Mother   . Diabetes Mother    History  Substance Use Topics  . Smoking status: Current Every Day Smoker  . Smokeless tobacco: Not on file  . Alcohol Use: No   OB History   Grav Para Term Preterm Abortions TAB SAB Ect Mult Living                 Review of Systems  Gastrointestinal: Positive for abdominal pain.  Genitourinary: Positive for vaginal bleeding.  Neurological: Positive for light-headedness.  All other systems reviewed and are negative.     Allergies  Review of patient's allergies indicates no known allergies.  Home Medications   Prior to Admission medications   Medication Sig Start Date End Date Taking? Authorizing Provider  guaiFENesin  (MUCINEX) 600 MG 12 hr tablet Take 600 mg by mouth 2 (two) times daily as needed for cough or to loosen phlegm.   Yes Historical Provider, MD  ibuprofen (ADVIL,MOTRIN) 200 MG tablet Take 200 mg by mouth every 6 (six) hours as needed for moderate pain or cramping.   Yes Historical Provider, MD  PRESCRIPTION MEDICATION Take 1 tablet by mouth daily. Birth control.   Yes Historical Provider, MD  ibuprofen (ADVIL,MOTRIN) 600 MG tablet Take 1 tablet (600 mg total) by mouth every 6 (six) hours as needed. 05/06/14   Juliet Rude. Akeria Hedstrom, MD  metroNIDAZOLE (FLAGYL) 500 MG tablet Take 1 tablet (500 mg total) by mouth 2 (two) times daily. 05/06/14   Juliet Rude. Derrion Tritz, MD   Triage Vitals: BP 130/75  Pulse 105  Temp(Src) 98.7 F (37.1 C) (Oral)  Resp 18  Ht 5\' 1"  (1.549 m)  Wt 123 lb (55.792 kg)  BMI 23.25 kg/m2  SpO2 100%  LMP 05/02/2014  Physical Exam  Nursing note and vitals reviewed. Constitutional: She is oriented to person, place, and time. She appears well-developed and well-nourished. No distress.  Alert, appropriate. NAD  HENT:  Head: Normocephalic and atraumatic.  Eyes: Conjunctivae and EOM are normal. Pupils are equal, round, and reactive to light.  Neck: Neck supple. No tracheal deviation present.  Cardiovascular: Regular rhythm and normal heart sounds.  Tachycardia present.  Mild Tachycardia  Pulmonary/Chest: Effort normal and breath sounds normal. No respiratory distress.  Abdominal: Soft. Bowel sounds are normal. She exhibits no distension and no mass. There is no tenderness.  Musculoskeletal: Normal range of motion. She exhibits no tenderness.  Lymphadenopathy:    She has no cervical adenopathy.  Neurological: She is alert and oriented to person, place, and time. She has normal reflexes.  Skin: Skin is warm and dry. No ecchymosis and no petechiae noted.  Psychiatric: She has a normal mood and affect. Her behavior is normal.    ED Course  Procedures (including critical care  time)  COORDINATION OF CARE: 10:22 AM- Will order u-preg and CBC w/ diff. Pt advised of plan for treatment and pt agrees.  Results for orders placed during the hospital encounter of 05/06/14  GC/CHLAMYDIA PROBE AMP      Result Value Ref Range   CT Probe RNA POSITIVE (*) NEGATIVE   GC Probe RNA NEGATIVE  NEGATIVE  WET PREP, GENITAL      Result Value Ref Range   Yeast Wet Prep HPF POC NONE SEEN  NONE SEEN   Trich, Wet Prep NONE SEEN  NONE SEEN   Clue Cells Wet Prep HPF POC NONE SEEN  NONE SEEN   WBC, Wet Prep HPF POC FEW (*) NONE SEEN  PREGNANCY, URINE      Result Value Ref Range   Preg Test, Ur NEGATIVE  NEGATIVE  CBC WITH DIFFERENTIAL      Result Value Ref Range   WBC 5.1  4.0 - 10.5 K/uL   RBC 4.15  3.87 - 5.11 MIL/uL   Hemoglobin 12.4  12.0 - 15.0 g/dL   HCT 81.136.5  91.436.0 - 78.246.0 %   MCV 88.0  78.0 - 100.0 fL   MCH 29.9  26.0 - 34.0 pg   MCHC 34.0  30.0 - 36.0 g/dL   RDW 95.612.4  21.311.5 - 08.615.5 %   Platelets 210  150 - 400 K/uL   Neutrophils Relative % 45  43 - 77 %   Neutro Abs 2.3  1.7 - 7.7 K/uL   Lymphocytes Relative 43  12 - 46 %   Lymphs Abs 2.2  0.7 - 4.0 K/uL   Monocytes Relative 7  3 - 12 %   Monocytes Absolute 0.4  0.1 - 1.0 K/uL   Eosinophils Relative 4  0 - 5 %   Eosinophils Absolute 0.2  0.0 - 0.7 K/uL   Basophils Relative 1  0 - 1 %   Basophils Absolute 0.0  0.0 - 0.1 K/uL     Labs Review Labs Reviewed  GC/CHLAMYDIA PROBE AMP - Abnormal; Notable for the following:    CT Probe RNA POSITIVE (*)    All other components within normal limits  WET PREP, GENITAL - Abnormal; Notable for the following:    WBC, Wet Prep HPF POC FEW (*)    All other components within normal limits  PREGNANCY, URINE  CBC WITH DIFFERENTIAL    Imaging Review No results found.   EKG Interpretation None      MDM   Final diagnoses:  Vaginal bleeding  Bacterial vaginosis    Patient with vaginal bleeding. Mild anemia. Pain. Wet prep some white cells.  I personally performed  the services described in this documentation, which was scribed in my presence. The recorded information has been reviewed and is accurate.  Chlamydia not yet positive at time of visit.    Juliet RudeNathan R. Rubin PayorPickering, MD 05/22/14 [phone]

## 2014-05-06 NOTE — ED Notes (Signed)
Pt c/o heavy vaginal bleeding, abd cramps that started 4 days ago, states that this is the normal time for her cycle although it was 3 days early but has never had problems with heavy bleeding, denies any pregnancy,

## 2014-05-06 NOTE — Discharge Instructions (Signed)
Abnormal Uterine Bleeding °Abnormal uterine bleeding can affect women at various stages in life, including teenagers, women in their reproductive years, pregnant women, and women who have reached menopause. Several kinds of uterine bleeding are considered abnormal, including: °· Bleeding or spotting between periods.   °· Bleeding after sexual intercourse.   °· Bleeding that is heavier or more than normal.   °· Periods that last longer than usual. °· Bleeding after menopause.   °Many cases of abnormal uterine bleeding are minor and simple to treat, while others are more serious. Any type of abnormal bleeding should be evaluated by your health care provider. Treatment will depend on the cause of the bleeding. °HOME CARE INSTRUCTIONS °Monitor your condition for any changes. The following actions may help to alleviate any discomfort you are experiencing: °· Avoid the use of tampons and douches as directed by your health care provider. °· Change your pads frequently. °You should get regular pelvic exams and Pap tests. Keep all follow-up appointments for diagnostic tests as directed by your health care provider.  °SEEK MEDICAL CARE IF:  °· Your bleeding lasts more than 1 week.   °· You feel dizzy at times.   °SEEK IMMEDIATE MEDICAL CARE IF:  °· You pass out.   °· You are changing pads every 15 to 30 minutes.   °· You have abdominal pain. °· You have a fever.   °· You become sweaty or weak.   °· You are passing large blood clots from the vagina.   °· You start to feel nauseous and vomit. °MAKE SURE YOU:  °· Understand these instructions. °· Will watch your condition. °· Will get help right away if you are not doing well or get worse. °Document Released: 09/01/2005 Document Revised: 09/06/2013 Document Reviewed: 03/31/2013 °ExitCare® Patient Information ©2015 [employer]. This information is not intended to replace advice given to you by your health care provider. Make sure you discuss any questions you have with your  health care provider. ° °Bacterial Vaginosis °Bacterial vaginosis is a vaginal infection that occurs when the normal balance of bacteria in the vagina is disrupted. It results from an overgrowth of certain bacteria. This is the most common vaginal infection in women of childbearing age. Treatment is important to prevent complications, especially in pregnant women, as it can cause a premature delivery. °CAUSES  °Bacterial vaginosis is caused by an increase in harmful bacteria that are normally present in smaller amounts in the vagina. Several different kinds of bacteria can cause bacterial vaginosis. However, the reason that the condition develops is not fully understood. °RISK FACTORS °Certain activities or behaviors can put you at an increased risk of developing bacterial vaginosis, including: °· Having a new sex partner or multiple sex partners. °· Douching. °· Using an intrauterine device (IUD) for contraception. °Women do not get bacterial vaginosis from toilet seats, bedding, swimming pools, or contact with objects around them. °SIGNS AND SYMPTOMS  °Some women with bacterial vaginosis have no signs or symptoms. Common symptoms include: °· Grey vaginal discharge. °· A fishlike odor with discharge, especially after sexual intercourse. °· Itching or burning of the vagina and vulva. °· Burning or pain with urination. °DIAGNOSIS  °Your health care provider will take a medical history and examine the vagina for signs of bacterial vaginosis. A sample of vaginal fluid may be taken. Your health care provider will look at this sample under a microscope to check for bacteria and abnormal cells. A vaginal pH test may also be done.  °TREATMENT  °Bacterial vaginosis may be treated with antibiotic medicines. These   may be given in the form of a pill or a vaginal cream. A second round of antibiotics may be prescribed if the condition comes back after treatment.  °HOME CARE INSTRUCTIONS  °· Only take over-the-counter or  prescription medicines as directed by your health care provider. °· If antibiotic medicine was prescribed, take it as directed. Make sure you finish it even if you start to feel better. °· Do not have sex until treatment is completed. °· Tell all sexual partners that you have a vaginal infection. They should see their health care provider and be treated if they have problems, such as a mild rash or itching. °· Practice safe sex by using condoms and only having one sex partner. °SEEK MEDICAL CARE IF:  °· Your symptoms are not improving after 3 days of treatment. °· You have increased discharge or pain. °· You have a fever. °MAKE SURE YOU:  °· Understand these instructions. °· Will watch your condition. °· Will get help right away if you are not doing well or get worse. °FOR MORE INFORMATION  °Centers for Disease Control and Prevention, Division of STD Prevention: www.cdc.gov/std °American Sexual Health Association (ASHA): www.ashastd.org  °Document Released: 09/01/2005 Document Revised: 06/22/2013 Document Reviewed: 04/13/2013 °ExitCare® Patient Information ©2015 [employer]. This information is not intended to replace advice given to you by your health care provider. Make sure you discuss any questions you have with your health care provider. ° °

## 2014-05-06 NOTE — ED Provider Notes (Signed)
THIS IS A SHARED VISIT WITH DR. PICKERING. THE PATIENT REQUESTED A FEMALE PROVIDER DO HER PELVIC EXAM.   Jacqueline ReedyOlivia B Flicker is a G0 with a negative pregnancy test today. She is sexually active and last had intercourse without pain 3 weeks ago. She uses OC's for birth control. She has 2 pills left in this pack. She denies having had STI's and had a normal pap smear at the health department less than one year ago.   Pelvic exam: external genitalia without lesions. Scant blood vaginal vault. Cervix friable, mucoid discharge noted from the os. No CMT, no adnexal tenderness, uterus without palpable enlargement. Collected GC and Chlamydia cultures. Patient tolerated the exam without problems.   McGregorHope M Arvon Schreiner, [place]NP 05/06/14 [phone]

## 2014-05-09 LAB — GC/CHLAMYDIA PROBE AMP
CT Probe RNA: POSITIVE — AB
GC PROBE AMP APTIMA: NEGATIVE

## 2014-05-10 ENCOUNTER — Telehealth (HOSPITAL_BASED_OUTPATIENT_CLINIC_OR_DEPARTMENT_OTHER): Payer: Self-pay | Admitting: Emergency Medicine

## 2014-05-10 NOTE — Telephone Encounter (Signed)
Post ED Visit - Positive Culture Follow-up: Successful Patient Follow-Up  Culture assessed and recommendations reviewed by:  Wes Dulaney, Pharm.D., BCPS  Celedonio Miyamoto, Pharm.D., BCPS  Georgina Pillion, [address].D., BCPS  [place], [address].D., BCPS, AAHIVP  Estella Husk, Pharm.D., BCPS, AAHIVP  Red Christians, Pharm.D.  Tennis Must, [place].D.  Positive chlamydia culture   Patient discharged without antimicrobial prescription and treatment is now indicated  Organism is resistant to prescribed ED discharge antimicrobial  Patient with positive blood cultures  Changes discussed with ED provider: sent to EDP 05/10/14 New antibiotic prescription  Called to   [facility] patient, date   , time    Berle Mull 05/10/2014, 8:56 AM

## 2014-05-12 ENCOUNTER — Telehealth (HOSPITAL_BASED_OUTPATIENT_CLINIC_OR_DEPARTMENT_OTHER): Payer: Self-pay | Admitting: Emergency Medicine

## 2014-05-16 ENCOUNTER — Telehealth (HOSPITAL_COMMUNITY): Payer: Self-pay

## 2015-09-13 ENCOUNTER — Other Ambulatory Visit: Payer: Self-pay | Admitting: Obstetrics and Gynecology

## 2015-09-13 DIAGNOSIS — O3680X0 Pregnancy with inconclusive fetal viability, not applicable or unspecified: Secondary | ICD-10-CM

## 2015-09-14 ENCOUNTER — Ambulatory Visit (INDEPENDENT_AMBULATORY_CARE_PROVIDER_SITE_OTHER): Payer: Medicaid Other

## 2015-09-14 DIAGNOSIS — O3680X0 Pregnancy with inconclusive fetal viability, not applicable or unspecified: Secondary | ICD-10-CM | POA: Diagnosis not present

## 2015-09-14 DIAGNOSIS — Z3A01 Less than 8 weeks gestation of pregnancy: Secondary | ICD-10-CM

## 2015-09-14 NOTE — Progress Notes (Signed)
US 5+6wks,single IUP pos fht 102bpm,normal ov's bilat,crl 3.7cm

## 2015-09-25 ENCOUNTER — Encounter: Payer: Self-pay | Admitting: Advanced Practice Midwife

## 2015-09-29 ENCOUNTER — Encounter (HOSPITAL_COMMUNITY): Payer: Self-pay | Admitting: Emergency Medicine

## 2015-09-29 ENCOUNTER — Emergency Department (HOSPITAL_COMMUNITY)
Admission: EM | Admit: 2015-09-29 | Discharge: 2015-09-29 | Disposition: A | Discharge status: Home / Self Care | Payer: Medicaid Other | Attending: Emergency Medicine | Admitting: Emergency Medicine

## 2015-09-29 DIAGNOSIS — O210 Mild hyperemesis gravidarum: Secondary | ICD-10-CM | POA: Insufficient documentation

## 2015-09-29 DIAGNOSIS — R000 Tachycardia, unspecified: Secondary | ICD-10-CM | POA: Insufficient documentation

## 2015-09-29 DIAGNOSIS — O219 Vomiting of pregnancy, unspecified: Secondary | ICD-10-CM

## 2015-09-29 DIAGNOSIS — Z8659 Personal history of other mental and behavioral disorders: Secondary | ICD-10-CM | POA: Insufficient documentation

## 2015-09-29 DIAGNOSIS — Z3A09 9 weeks gestation of pregnancy: Secondary | ICD-10-CM | POA: Insufficient documentation

## 2015-09-29 DIAGNOSIS — O99331 Smoking (tobacco) complicating pregnancy, first trimester: Secondary | ICD-10-CM | POA: Insufficient documentation

## 2015-09-29 DIAGNOSIS — O9989 Other specified diseases and conditions complicating pregnancy, childbirth and the puerperium: Secondary | ICD-10-CM | POA: Insufficient documentation

## 2015-09-29 DIAGNOSIS — Z8639 Personal history of other endocrine, nutritional and metabolic disease: Secondary | ICD-10-CM | POA: Insufficient documentation

## 2015-09-29 DIAGNOSIS — M1990 Unspecified osteoarthritis, unspecified site: Secondary | ICD-10-CM | POA: Insufficient documentation

## 2015-09-29 DIAGNOSIS — F17210 Nicotine dependence, cigarettes, uncomplicated: Secondary | ICD-10-CM | POA: Insufficient documentation

## 2015-09-29 DIAGNOSIS — Z792 Long term (current) use of antibiotics: Secondary | ICD-10-CM | POA: Insufficient documentation

## 2015-09-29 LAB — URINE MICROSCOPIC-ADD ON

## 2015-09-29 LAB — URINALYSIS, ROUTINE W REFLEX MICROSCOPIC
Bilirubin Urine: NEGATIVE
Glucose, UA: NEGATIVE mg/dL
Hgb urine dipstick: NEGATIVE
Ketones, ur: 15 mg/dL — AB
Nitrite: NEGATIVE
PROTEIN: NEGATIVE mg/dL
Specific Gravity, Urine: 1.01 (ref 1.005–1.030)
pH: >9 — ABNORMAL HIGH (ref 5.0–8.0)

## 2015-09-29 MED ORDER — SODIUM CHLORIDE 0.9 % IV SOLN
Freq: Once | INTRAVENOUS | Status: AC
  Administered 2015-09-29: 16:00:00 via INTRAVENOUS

## 2015-09-29 MED ORDER — VITAMIN B-6 100 MG PO TABS: 100.0000 mg | ORAL_TABLET | Freq: Every day | ORAL | Status: DC

## 2015-09-29 MED ORDER — METOCLOPRAMIDE HCL 5 MG/ML IJ SOLN
10.0000 mg | Freq: Once | INTRAMUSCULAR | Status: AC
  Administered 2015-09-29: 10 mg via INTRAVENOUS
  Filled 2015-09-29: qty 2

## 2015-09-29 MED ORDER — DIPHENHYDRAMINE HCL 50 MG/ML IJ SOLN
12.5000 mg | Freq: Once | INTRAMUSCULAR | Status: AC
  Administered 2015-09-29: 12.5 mg via INTRAVENOUS
  Filled 2015-09-29: qty 1

## 2015-09-29 MED ORDER — DOXYLAMINE SUCCINATE (SLEEP) 25 MG PO TABS: 25.0000 mg | ORAL_TABLET | Freq: Every evening | ORAL | Status: DC | PRN

## 2015-09-29 NOTE — Discharge Instructions (Signed)

## 2015-09-29 NOTE — ED Provider Notes (Signed)
CSN: [id number]     Arrival date & time 09/29/15  1516 History   First MD Initiated Contact with Patient 09/29/15 1521     Chief Complaint  Patient presents with  . Emesis During Pregnancy     (Consider location/radiation/quality/duration/timing/severity/associated sxs/prior Treatment) Patient is a 20 y.o. female presenting with vomiting.  Emesis Severity:  Severe Timing:  Constant Number of daily episodes:  Multiple Able to tolerate:  Liquids Progression:  Worsening Chronicity:  New Relieved by:  Nothing Worsened by:  Nothing tried Ineffective treatments:  None tried Associated symptoms: no abdominal pain   Risk factors: pregnant now   Pt complains of vomiting.  Pt is [redacted] weeks pregnant.  Pt is scheduled to see Dr. Emelda FearFerguson on Wednesday  Past Medical History  Diagnosis Date  . Anxiety   . Arthritis   . Thyroid disease    Past Surgical History  Procedure Laterality Date  . Hernia repair     Family History  Problem Relation Age of Onset  . Hypertension Mother   . Diabetes Mother    Social History  Substance Use Topics  . Smoking status: Current Some Day Smoker -- 0.50 packs/day    Types: Cigarettes  . Smokeless tobacco: None  . Alcohol Use: No   OB History    Gravida Para Term Preterm AB TAB SAB Ectopic Multiple Living   1              Review of Systems  Gastrointestinal: Positive for vomiting. Negative for abdominal pain.  All other systems reviewed and are negative.     Allergies  Tylenol  Home Medications   Prior to Admission medications   Medication Sig Start Date End Date Taking? Authorizing Provider  guaiFENesin (MUCINEX) 600 MG 12 hr tablet Take 600 mg by mouth 2 (two) times daily as needed for cough or to loosen phlegm.    Historical Provider, MD  ibuprofen (ADVIL,MOTRIN) 200 MG tablet Take 200 mg by mouth every 6 (six) hours as needed for moderate pain or cramping.    Historical Provider, MD  ibuprofen (ADVIL,MOTRIN) 600 MG tablet Take 1  tablet (600 mg total) by mouth every 6 (six) hours as needed. 05/06/14   Benjiman CoreNathan Pickering, MD  metroNIDAZOLE (FLAGYL) 500 MG tablet Take 1 tablet (500 mg total) by mouth 2 (two) times daily. 05/06/14   Benjiman CoreNathan Pickering, MD  PRESCRIPTION MEDICATION Take 1 tablet by mouth daily. Birth control.    Historical Provider, MD   BP 131/70 mmHg  Pulse 107  Temp(Src) 98.2 F (36.8 C) (Oral)  Resp 16  Ht 5\' 2"  (1.575 m)  Wt 52.164 kg  BMI 21.03 kg/m2  SpO2 100%  LMP 07/27/2015 (Approximate) Physical Exam  Constitutional: She is oriented to person, place, and time. She appears well-developed and well-nourished.  HENT:  Head: Normocephalic.  Right Ear: External ear normal.  Left Ear: External ear normal.  Nose: Nose normal.  Mouth/Throat: Oropharynx is clear and moist.  Eyes: Pupils are equal, round, and reactive to light.  Neck: Normal range of motion.  Cardiovascular: Normal heart sounds.   tachycardia  Pulmonary/Chest: Effort normal and breath sounds normal.  Abdominal: Soft.  Musculoskeletal: Normal range of motion.  Neurological: She is alert and oriented to person, place, and time. She has normal reflexes.  Skin: Skin is warm.  Psychiatric: She has a normal mood and affect.  Vitals reviewed.   ED Course  Procedures (including critical care time) Labs Review Labs Reviewed  URINALYSIS, ROUTINE W  REFLEX MICROSCOPIC (NOT AT [facility]) - Abnormal; Notable for the following:    APPearance CLOUDY (*)    pH >9.0 (*)    Ketones, ur 15 (*)    Leukocytes, UA MODERATE (*)    All other components within normal limits  URINE MICROSCOPIC-ADD ON - Abnormal; Notable for the following:    Squamous Epithelial / LPF 0-5 (*)    Bacteria, UA FEW (*)    All other components within normal limits     Imaging Review No results found. I have personally reviewed and evaluated these images and lab results as part of my medical decision-making.   EKG Interpretation None      MDM Iv fluids, Urine to  evaluate for ketones,  reglan and benadryl Iv.   Pt has 15 ketones, no vomiting.  Pt advised unisom and B6 Final diagnoses:  Vomiting during pregnancy     See Dr. Emelda Fear for recheck on Wednesday as scheduled   Elson Areas, PA-C 09/29/15 1542  Lonia Skinner [place], PA-C 09/29/15 1604  Lonia Skinner [place], PA-C 09/29/15 1652  Samuel Jester, DO 10/03/15 [phone]

## 2015-09-29 NOTE — ED Notes (Signed)
Pt reports n/v that began last night. Pt states she is unable to tolerate anything PO. Denies diarrhea. Denies vaginal bleeding. Pt approx [redacted] weeks pregnant.

## 2015-10-03 ENCOUNTER — Encounter: Payer: Self-pay | Admitting: Advanced Practice Midwife

## 2015-10-03 ENCOUNTER — Ambulatory Visit (INDEPENDENT_AMBULATORY_CARE_PROVIDER_SITE_OTHER): Payer: Medicaid Other | Admitting: Advanced Practice Midwife

## 2015-10-03 VITALS — BP 120/70 | HR 74 | Wt 115.0 lb

## 2015-10-03 DIAGNOSIS — Z3481 Encounter for supervision of other normal pregnancy, first trimester: Secondary | ICD-10-CM | POA: Diagnosis not present

## 2015-10-03 DIAGNOSIS — O99331 Smoking (tobacco) complicating pregnancy, first trimester: Secondary | ICD-10-CM | POA: Diagnosis not present

## 2015-10-03 DIAGNOSIS — F17200 Nicotine dependence, unspecified, uncomplicated: Secondary | ICD-10-CM | POA: Diagnosis not present

## 2015-10-03 DIAGNOSIS — E039 Hypothyroidism, unspecified: Secondary | ICD-10-CM

## 2015-10-03 DIAGNOSIS — Z369 Encounter for antenatal screening, unspecified: Secondary | ICD-10-CM

## 2015-10-03 DIAGNOSIS — Z3682 Encounter for antenatal screening for nuchal translucency: Secondary | ICD-10-CM

## 2015-10-03 DIAGNOSIS — M1990 Unspecified osteoarthritis, unspecified site: Secondary | ICD-10-CM | POA: Insufficient documentation

## 2015-10-03 DIAGNOSIS — Z331 Pregnant state, incidental: Secondary | ICD-10-CM

## 2015-10-03 DIAGNOSIS — Z3400 Encounter for supervision of normal first pregnancy, unspecified trimester: Secondary | ICD-10-CM | POA: Insufficient documentation

## 2015-10-03 DIAGNOSIS — F419 Anxiety disorder, unspecified: Secondary | ICD-10-CM | POA: Insufficient documentation

## 2015-10-03 DIAGNOSIS — E079 Disorder of thyroid, unspecified: Secondary | ICD-10-CM | POA: Insufficient documentation

## 2015-10-03 DIAGNOSIS — Z1389 Encounter for screening for other disorder: Secondary | ICD-10-CM

## 2015-10-03 DIAGNOSIS — Z0283 Encounter for blood-alcohol and blood-drug test: Secondary | ICD-10-CM

## 2015-10-03 LAB — POCT URINALYSIS DIPSTICK
Blood, UA: NEGATIVE
Glucose, UA: NEGATIVE
Ketones, UA: NEGATIVE
LEUKOCYTES UA: NEGATIVE
NITRITE UA: NEGATIVE
PROTEIN UA: NEGATIVE

## 2015-10-03 MED ORDER — PROMETHAZINE HCL 12.5 MG PO TABS: 12.5000 mg | ORAL_TABLET | Freq: Four times a day (QID) | ORAL | Status: DC | PRN

## 2015-10-03 MED ORDER — DOXYLAMINE-PYRIDOXINE 10-10 MG PO TBEC: DELAYED_RELEASE_TABLET | ORAL | Status: DC

## 2015-10-03 NOTE — Patient Instructions (Signed)
Safe Medications in Pregnancy   Acne: Benzoyl Peroxide Salicylic Acid  Backache/Headache: Tylenol: 2 regular strength every 4 hours OR              2 Extra strength every 6 hours  Colds/Coughs/Allergies: Benadryl (alcohol free) 25 mg every 6 hours as needed Breath right strips Claritin Cepacol throat lozenges Chloraseptic throat spray Cold-Eeze- up to three times per day Cough drops, alcohol free Flonase (by prescription only) Guaifenesin Mucinex Robitussin DM (plain only, alcohol free) Saline nasal spray/drops Sudafed (pseudoephedrine) & Actifed ** use only after [redacted] weeks gestation and if you do not have high blood pressure Tylenol Vicks Vaporub Zinc lozenges Zyrtec   Constipation: Colace Ducolax suppositories Fleet enema Glycerin suppositories Metamucil Milk of magnesia Miralax Senokot Smooth move tea  Diarrhea: Kaopectate Imodium A-D  *NO pepto Bismol  Hemorrhoids: Anusol Anusol HC Preparation H Tucks  Indigestion: Tums Maalox Mylanta Zantac  Pepcid  Insomnia: Benadryl (alcohol free) 25mg every 6 hours as needed Tylenol PM Unisom, no Gelcaps  Leg Cramps: Tums MagGel  Nausea/Vomiting:  Bonine Dramamine Emetrol Ginger extract Sea bands Meclizine  Nausea medication to take during pregnancy:  Unisom (doxylamine succinate 25 mg tablets) Take one tablet daily at bedtime. If symptoms are not adequately controlled, the dose can be increased to a maximum recommended dose of two tablets daily (1/2 tablet in the morning, 1/2 tablet mid-afternoon and one at bedtime). Vitamin B6 100mg tablets. Take one tablet twice a day (up to 200 mg per day).  Skin Rashes: Aveeno products Benadryl cream or 25mg every 6 hours as needed Calamine Lotion 1% cortisone cream  Yeast infection: Gyne-lotrimin 7 Monistat 7   **If taking multiple medications, please check labels to avoid duplicating the same active ingredients **take medication as directed on  the label ** Do not exceed 4000 mg of tylenol in 24 hours **Do not take medications that contain aspirin or ibuprofen    First Trimester of Pregnancy The first trimester of pregnancy is from week 1 until the end of week 12 (months 1 through 3). A week after a sperm fertilizes an egg, the egg will implant on the wall of the uterus. This embryo will begin to develop into a baby. Genes from you and your partner are forming the baby. The female genes determine whether the baby is a boy or a girl. At 6-8 weeks, the eyes and face are formed, and the heartbeat can be seen on ultrasound. At the end of 12 weeks, all the baby's organs are formed.  Now that you are pregnant, you will want to do everything you can to have a healthy baby. Two of the most important things are to get good prenatal care and to follow your health care provider's instructions. Prenatal care is all the medical care you receive before the baby's birth. This care will help prevent, find, and treat any problems during the pregnancy and childbirth. BODY CHANGES Your body goes through many changes during pregnancy. The changes vary from woman to woman.   You may gain or lose a couple of pounds at first.  You may feel sick to your stomach (nauseous) and throw up (vomit). If the vomiting is uncontrollable, call your health care provider.  You may tire easily.  You may develop headaches that can be relieved by medicines approved by your health care provider.  You may urinate more often. Painful urination may mean you have a bladder infection.  You may develop heartburn as a result of your   pregnancy.  You may develop constipation because certain hormones are causing the muscles that push waste through your intestines to slow down.  You may develop hemorrhoids or swollen, bulging veins (varicose veins).  Your breasts may begin to grow larger and become tender. Your nipples may stick out more, and the tissue that surrounds them (areola)  may become darker.  Your gums may bleed and may be sensitive to brushing and flossing.  Dark spots or blotches (chloasma, mask of pregnancy) may develop on your face. This will likely fade after the baby is born.  Your menstrual periods will stop.  You may have a loss of appetite.  You may develop cravings for certain kinds of food.  You may have changes in your emotions from day to day, such as being excited to be pregnant or being concerned that something may go wrong with the pregnancy and baby.  You may have more vivid and strange dreams.  You may have changes in your hair. These can include thickening of your hair, rapid growth, and changes in texture. Some women also have hair loss during or after pregnancy, or hair that feels dry or thin. Your hair will most likely return to normal after your baby is born. WHAT TO EXPECT AT YOUR PRENATAL VISITS During a routine prenatal visit:  You will be weighed to make sure you and the baby are growing normally.  Your blood pressure will be taken.  Your abdomen will be measured to track your baby's growth.  The fetal heartbeat will be listened to starting around week 10 or 12 of your pregnancy.  Test results from any previous visits will be discussed. Your health care provider may ask you:  How you are feeling.  If you are feeling the baby move.  If you have had any abnormal symptoms, such as leaking fluid, bleeding, severe headaches, or abdominal cramping.  If you are using any tobacco products, including cigarettes, chewing tobacco, and electronic cigarettes.  If you have any questions. Other tests that may be performed during your first trimester include:  Blood tests to find your blood type and to check for the presence of any previous infections. They will also be used to check for low iron levels (anemia) and Rh antibodies. Later in the pregnancy, blood tests for diabetes will be done along with other tests if problems  develop.  Urine tests to check for infections, diabetes, or protein in the urine.  An ultrasound to confirm the proper growth and development of the baby.  An amniocentesis to check for possible genetic problems.  Fetal screens for spina bifida and Down syndrome.  You may need other tests to make sure you and the baby are doing well.  HIV (human immunodeficiency virus) testing. Routine prenatal testing includes screening for HIV, unless you choose not to have this test. HOME CARE INSTRUCTIONS  Medicines  Follow your health care provider's instructions regarding medicine use. Specific medicines may be either safe or unsafe to take during pregnancy.  Take your prenatal vitamins as directed.  If you develop constipation, try taking a stool softener if your health care provider approves. Diet  Eat regular, well-balanced meals. Choose a variety of foods, such as meat or vegetable-based protein, fish, milk and low-fat dairy products, vegetables, fruits, and whole grain breads and cereals. Your health care provider will help you determine the amount of weight gain that is right for you.  Avoid raw meat and uncooked cheese. These carry germs that can cause   birth defects in the baby.  Eating four or five small meals rather than three large meals a day may help relieve nausea and vomiting. If you start to feel nauseous, eating a few soda crackers can be helpful. Drinking liquids between meals instead of during meals also seems to help nausea and vomiting.  If you develop constipation, eat more high-fiber foods, such as fresh vegetables or fruit and whole grains. Drink enough fluids to keep your urine clear or pale yellow. Activity and Exercise  Exercise only as directed by your health care provider. Exercising will help you:  Control your weight.  Stay in shape.  Be prepared for labor and delivery.  Experiencing pain or cramping in the lower abdomen or low back is a good sign that you  should stop exercising. Check with your health care provider before continuing normal exercises.  Try to avoid standing for long periods of time. Move your legs often if you must stand in one place for a long time.  Avoid heavy lifting.  Wear low-heeled shoes, and practice good posture.  You may continue to have sex unless your health care provider directs you otherwise. Relief of Pain or Discomfort  Wear a good support bra for breast tenderness.   Take warm sitz baths to soothe any pain or discomfort caused by hemorrhoids. Use hemorrhoid cream if your health care provider approves.   Rest with your legs elevated if you have leg cramps or low back pain.  If you develop varicose veins in your legs, wear support hose. Elevate your feet for 15 minutes, 3-4 times a day. Limit salt in your diet. Prenatal Care  Schedule your prenatal visits by the twelfth week of pregnancy. They are usually scheduled monthly at first, then more often in the last 2 months before delivery.  Write down your questions. Take them to your prenatal visits.  Keep all your prenatal visits as directed by your health care provider. Safety  Wear your seat belt at all times when driving.  Make a list of emergency phone numbers, including numbers for family, friends, the hospital, and police and fire departments. General Tips  Ask your health care provider for a referral to a local prenatal education class. Begin classes no later than at the beginning of month 6 of your pregnancy.  Ask for help if you have counseling or nutritional needs during pregnancy. Your health care provider can offer advice or refer you to specialists for help with various needs.  Do not use hot tubs, steam rooms, or saunas.  Do not douche or use tampons or scented sanitary pads.  Do not cross your legs for long periods of time.  Avoid cat litter boxes and soil used by cats. These carry germs that can cause birth defects in the baby  and possibly loss of the fetus by miscarriage or stillbirth.  Avoid all smoking, herbs, alcohol, and medicines not prescribed by your health care provider. Chemicals in these affect the formation and growth of the baby.  Do not use any tobacco products, including cigarettes, chewing tobacco, and electronic cigarettes. If you need help quitting, ask your health care provider. You may receive counseling support and other resources to help you quit.  Schedule a dentist appointment. At home, brush your teeth with a soft toothbrush and be gentle when you floss. SEEK MEDICAL CARE IF:   You have dizziness.  You have mild pelvic cramps, pelvic pressure, or nagging pain in the abdominal area.  You have persistent   nausea, vomiting, or diarrhea.  You have a bad smelling vaginal discharge.  You have pain with urination.  You notice increased swelling in your face, hands, legs, or ankles. SEEK IMMEDIATE MEDICAL CARE IF:   You have a fever.  You are leaking fluid from your vagina.  You have spotting or bleeding from your vagina.  You have severe abdominal cramping or pain.  You have rapid weight gain or loss.  You vomit blood or material that looks like coffee grounds.  You are exposed to German measles and have never had them.  You are exposed to fifth disease or chickenpox.  You develop a severe headache.  You have shortness of breath.  You have any kind of trauma, such as from a fall or a car accident.   This information is not intended to replace advice given to you by your health care provider. Make sure you discuss any questions you have with your health care provider.   Document Released: 08/26/2001 Document Revised: 09/22/2014 Document Reviewed: 07/12/2013 Elsevier Interactive Patient Education 2016 [employer] Inc.  

## 2015-10-03 NOTE — Progress Notes (Signed)
  Subjective:    Jacqueline Ellison is a G1P0 [redacted]w[redacted]d being seen today for her first obstetrical visit.  Her obstetrical history is significant for smoker.  She is so sick, it's hard to smoke. Wants to quit completely Pregnancy history fully reviewed.  Shetchy details regarding "thyroid disease".  Can't remember why or when she was given that dx. Never took meds.  Thinks is was underactive.   Smoking cessation strategies:  [place] quitline, gum, patches, e cigs, etc discussed.  Patient reports nausea.  Unisom makes her too sleepy to take. Out of diclegis samples.  Medicaid number on card won't go through with prior auth attempt.   Filed Vitals:   10/03/15 0850  BP: 120/70  Pulse: 74  Weight: 115 lb (52.164 kg)    HISTORY: OB History  Gravida Para Term Preterm AB SAB TAB Ectopic Multiple Living  1             # Outcome Date GA Lbr Len/2nd Weight Sex Delivery Anes PTL Lv  1 Current              Past Medical History  Diagnosis Date  . Thyroid disease   . Arthritis   . Anxiety    Past Surgical History  Procedure Laterality Date  . Hernia repair     Family History  Problem Relation Age of Onset  . Hypertension Mother   . Diabetes Mother      Exam                                      System:     Skin: normal coloration and turgor, no rashes    Neurologic: oriented, normal, normal mood   Extremities: normal strength, tone, and muscle mass   HEENT PERRLA   Mouth/Teeth mucous membranes moist, normal dentition   Neck supple and no masses   Cardiovascular: regular rate and rhythm   Respiratory:  appears well, vitals normal, no respiratory distress, acyanotic   Abdomen: soft, non-tender;  FHR: 160US          Assessment:    Pregnancy: G1P0 Patient Active Problem List   Diagnosis Date Noted  . Supervision of normal first pregnancy 10/03/2015  . Thyroid disease   . Arthritis   . Anxiety         Plan:     Initial labs drawn. Continue prenatal vitamins   Problem list reviewed and updated  Call back next week and ask [place] to try prior auth again.  In the meantime, Rx phenergan CCNC done Reviewed n/v relief measures and warning s/s to report  Reviewed recommended weight gain based on pre-gravid BMI  Encouraged well-balanced diet Genetic Screening discussed Integrated Screen: requested.  Ultrasound discussed; fetal survey: requested.  Return in about 4 weeks (around 10/31/2015) for LROB, US:NT+1st IT.  CRESENZO-DISHMAN,Maxmilian Trostel 10/03/2015

## 2015-10-04 LAB — URINE CULTURE

## 2015-10-05 LAB — GC/CHLAMYDIA PROBE AMP
Chlamydia trachomatis, NAA: POSITIVE — AB
NEISSERIA GONORRHOEAE BY PCR: NEGATIVE

## 2015-10-05 LAB — SPECIMEN STATUS REPORT

## 2015-10-11 LAB — CBC
HEMATOCRIT: 39.6 % (ref 34.0–46.6)
Hemoglobin: 13.3 g/dL (ref 11.1–15.9)
MCH: 29.2 pg (ref 26.6–33.0)
MCHC: 33.6 g/dL (ref 31.5–35.7)
MCV: 87 fL (ref 79–97)
Platelets: 240 10*3/uL (ref 150–379)
RBC: 4.56 x10E6/uL (ref 3.77–5.28)
RDW: 13 % (ref 12.3–15.4)
WBC: 7.5 10*3/uL (ref 3.4–10.8)

## 2015-10-11 LAB — PMP SCREEN PROFILE (10S), URINE
Amphetamine Screen, Ur: NEGATIVE ng/mL
Barbiturate Screen, Ur: NEGATIVE ng/mL
Benzodiazepine Screen, Urine: NEGATIVE ng/mL
CANNABINOIDS UR QL SCN: POSITIVE ng/mL
Cocaine(Metab.)Screen, Urine: NEGATIVE ng/mL
Creatinine(Crt), U: 125.9 mg/dL (ref 20.0–300.0)
Methadone Scn, Ur: NEGATIVE ng/mL
Opiate Scrn, Ur: NEGATIVE ng/mL
Oxycodone+Oxymorphone Ur Ql Scn: NEGATIVE ng/mL
PCP SCRN UR: NEGATIVE ng/mL
Ph of Urine: 7.1 (ref 4.5–8.9)
Propoxyphene, Screen: NEGATIVE ng/mL

## 2015-10-11 LAB — URINALYSIS, ROUTINE W REFLEX MICROSCOPIC
BILIRUBIN UA: NEGATIVE
Glucose, UA: NEGATIVE
KETONES UA: NEGATIVE
Leukocytes, UA: NEGATIVE
NITRITE UA: NEGATIVE
Protein, UA: NEGATIVE
RBC, UA: NEGATIVE
SPEC GRAV UA: 1.014 (ref 1.005–1.030)
Urobilinogen, Ur: 0.2 mg/dL (ref 0.2–1.0)
pH, UA: 7 (ref 5.0–7.5)

## 2015-10-11 LAB — ANTIBODY SCREEN: Antibody Screen: NEGATIVE

## 2015-10-11 LAB — ABO/RH: Rh Factor: POSITIVE

## 2015-10-11 LAB — VARICELLA ZOSTER ANTIBODY, IGG: Varicella zoster IgG: 770 index (ref >165)

## 2015-10-11 LAB — TSH: TSH: 0.351 u[IU]/mL — ABNORMAL LOW (ref 0.450–4.500)

## 2015-10-11 LAB — RUBELLA SCREEN: Rubella Antibodies, IGG: 1 index (ref >0.99)

## 2015-10-11 LAB — CYSTIC FIBROSIS MUTATION 97: Interpretation: NOT DETECTED

## 2015-10-11 LAB — HIV ANTIBODY (ROUTINE TESTING W REFLEX): HIV Screen 4th Generation wRfx: NONREACTIVE

## 2015-10-11 LAB — SPECIMEN STATUS REPORT

## 2015-10-11 LAB — HEPATITIS B SURFACE ANTIGEN: Hepatitis B Surface Ag: NEGATIVE

## 2015-10-11 LAB — RPR: RPR Ser Ql: NONREACTIVE

## 2015-10-19 ENCOUNTER — Telehealth: Payer: Self-pay | Admitting: Advanced Practice Midwife

## 2015-10-19 MED ORDER — DOXYLAMINE-PYRIDOXINE 10-10 MG PO TBEC: 10.0000 mg | DELAYED_RELEASE_TABLET | ORAL | Status: DC

## 2015-10-19 NOTE — Telephone Encounter (Signed)
Diclegis samples left at front desk for pt to pick up.

## 2015-10-22 ENCOUNTER — Other Ambulatory Visit: Payer: Self-pay | Admitting: Women's Health

## 2015-10-22 ENCOUNTER — Encounter: Payer: Self-pay | Admitting: Women's Health

## 2015-10-22 ENCOUNTER — Telehealth: Payer: Self-pay | Admitting: *Deleted

## 2015-10-22 DIAGNOSIS — A749 Chlamydial infection, unspecified: Secondary | ICD-10-CM | POA: Insufficient documentation

## 2015-10-22 DIAGNOSIS — O98811 Other maternal infectious and parasitic diseases complicating pregnancy, first trimester: Secondary | ICD-10-CM

## 2015-10-22 MED ORDER — AZITHROMYCIN 500 MG PO TABS: 1000.0000 mg | ORAL_TABLET | Freq: Once | ORAL | Status: DC

## 2015-10-31 ENCOUNTER — Ambulatory Visit (INDEPENDENT_AMBULATORY_CARE_PROVIDER_SITE_OTHER): Payer: Medicaid Other

## 2015-10-31 ENCOUNTER — Encounter: Payer: Self-pay | Admitting: Advanced Practice Midwife

## 2015-10-31 ENCOUNTER — Ambulatory Visit (INDEPENDENT_AMBULATORY_CARE_PROVIDER_SITE_OTHER): Payer: Self-pay | Admitting: Advanced Practice Midwife

## 2015-10-31 VITALS — BP 108/68 | HR 80 | Wt 114.0 lb

## 2015-10-31 DIAGNOSIS — Z36 Encounter for antenatal screening of mother: Secondary | ICD-10-CM | POA: Diagnosis not present

## 2015-10-31 DIAGNOSIS — Z3682 Encounter for antenatal screening for nuchal translucency: Secondary | ICD-10-CM

## 2015-10-31 DIAGNOSIS — Z1389 Encounter for screening for other disorder: Secondary | ICD-10-CM

## 2015-10-31 DIAGNOSIS — Z3401 Encounter for supervision of normal first pregnancy, first trimester: Secondary | ICD-10-CM

## 2015-10-31 DIAGNOSIS — Z331 Pregnant state, incidental: Secondary | ICD-10-CM

## 2015-10-31 DIAGNOSIS — Z3A13 13 weeks gestation of pregnancy: Secondary | ICD-10-CM

## 2015-10-31 LAB — POCT URINALYSIS DIPSTICK
Glucose, UA: NEGATIVE
Ketones, UA: NEGATIVE
Nitrite, UA: NEGATIVE
PROTEIN UA: NEGATIVE
RBC UA: NEGATIVE

## 2015-10-31 NOTE — Progress Notes (Signed)
G1P0 [redacted]w[redacted]d Estimated Date of Delivery: 05/10/16  Blood pressure 108/68, pulse 80, weight 114 lb (51.71 kg), last menstrual period 07/27/2015.   BP weight and urine results all reviewed and noted.  Please refer to the obstetrical flow sheet for the fundal height and fetal heart rate documentation: [place] 12+4WKS,measurements c/w dates,normal ov's bilat,NB present,NT 2.32mm,crl 64.8mm,fhr 159 bpm   Patient denies any bleeding and no rupture of membranes symptoms or regular contractions. Patient had a hernia repair in her left groin when she was 12.  After vomiting, felt like she strained in that area.    Pt informed of + CHL.  Boyfriend in jail (pt will make sure he was treated before they have sex again)  All questions were answered.  Orders Placed This Encounter  Procedures  . Maternal Screen, Integrated #1  . POCT urinalysis dipstick    Plan:  Continued routine obstetrical care, NT/IT today.  Plan to get a maternity belt to add some support. Return in about 4 weeks (around 11/28/2015) for LROB, 2nd IT.

## 2015-10-31 NOTE — Progress Notes (Signed)
[place] 12+4WKS,measurements c/w dates,normal ov's bilat,NB present,NT 2.53mm,crl 64.20mm,fhr 159 bpm

## 2015-10-31 NOTE — Telephone Encounter (Signed)
LMOM

## 2015-11-28 ENCOUNTER — Ambulatory Visit (INDEPENDENT_AMBULATORY_CARE_PROVIDER_SITE_OTHER): Payer: Self-pay | Admitting: Advanced Practice Midwife

## 2015-11-28 VITALS — BP 112/60 | HR 96 | Wt 116.0 lb

## 2015-11-28 DIAGNOSIS — Z331 Pregnant state, incidental: Secondary | ICD-10-CM

## 2015-11-28 DIAGNOSIS — Z1389 Encounter for screening for other disorder: Secondary | ICD-10-CM

## 2015-11-28 DIAGNOSIS — Z363 Encounter for antenatal screening for malformations: Secondary | ICD-10-CM

## 2015-11-28 DIAGNOSIS — Z3682 Encounter for antenatal screening for nuchal translucency: Secondary | ICD-10-CM

## 2015-11-28 DIAGNOSIS — A749 Chlamydial infection, unspecified: Secondary | ICD-10-CM

## 2015-11-28 LAB — POCT URINALYSIS DIPSTICK
Blood, UA: NEGATIVE
GLUCOSE UA: NEGATIVE
Ketones, UA: NEGATIVE
LEUKOCYTES UA: NEGATIVE
NITRITE UA: NEGATIVE

## 2015-11-28 NOTE — Patient Instructions (Signed)

## 2015-11-28 NOTE — Progress Notes (Signed)
G1P0 826w4d Estimated Date of Delivery: 05/10/16  Blood pressure 112/60, pulse 96, weight 116 lb (52.617 kg), last menstrual period 07/27/2015.   BP weight and urine results all reviewed and noted.  Please refer to the obstetrical flow sheet for the fundal height and fetal heart rate documentation:  Patient reports good fetal movement, denies any bleeding and no rupture of membranes symptoms or regular contractions. Patient is without complaints. All questions were answered.  Orders Placed This Encounter  Procedures  . GC/Chlamydia Probe Amp  . US OB Comp + 14 Wk  . Maternal Screen, Integrated #2  . POCT urinalysis dipstick    Plan:  Continued routine obstetrical care, POC chlamydia, 2nd IT                Return in about 2 weeks (around 12/14/2015) for ZO:XWRUEAVS:Anatomy, LROB.

## 2015-11-29 ENCOUNTER — Other Ambulatory Visit: Payer: Self-pay | Admitting: Advanced Practice Midwife

## 2015-11-29 LAB — GC/CHLAMYDIA PROBE AMP
CHLAMYDIA, DNA PROBE: POSITIVE — AB
NEISSERIA GONORRHOEAE BY PCR: NEGATIVE

## 2015-11-29 MED ORDER — AZITHROMYCIN 500 MG PO TABS: 1000.0000 mg | ORAL_TABLET | Freq: Once | ORAL | Status: DC

## 2015-11-29 NOTE — Progress Notes (Signed)
CHL still +.  FOB in jail (?), so tx failure?  Rx azithromycin 1gm.  Pt left message to call

## 2015-12-05 LAB — MATERNAL SCREEN, INTEGRATED #1
CROWN RUMP LENGTH MAT SCREEN: 64.6 mm
Nuchal Translucency (NT): 2.2 mm
Number of Fetuses: 1
PAPP-A Value: 2336.4 ng/mL
WEIGHT: 114 [lb_av]

## 2015-12-05 LAB — MATERNAL SCREEN, INTEGRATED #2
ADSF: 1.63
AFP MoM: 1.41
Alpha-Fetoprotein: 55.5 ng/mL
Crown Rump Length: 64.6 mm
DIA MOM: 1.4
DIA VALUE: 287.8 pg/mL
Estriol, Unconjugated: 1.63 ng/mL
Gest. Age on Collection Date: 12.7 weeks
Gestational Age: 16.7 weeks
HCG VALUE: 17.6 [IU]/mL
MATERNAL AGE AT EDD: 22.3 a
NUCHAL TRANSLUCENCY (NT): 2.2 mm
NUCHAL TRANSLUCENCY MOM: 1.36
NUMBER OF FETUSES: 1
PAPP-A MOM: 1.57
PAPP-A Value: 2336.4 ng/mL
Test Results:: NEGATIVE
Weight: 114 [lb_av]
Weight: 116 [lb_av]
hCG MoM: 0.5

## 2015-12-12 ENCOUNTER — Encounter: Payer: Self-pay | Admitting: Women's Health

## 2015-12-12 ENCOUNTER — Ambulatory Visit (INDEPENDENT_AMBULATORY_CARE_PROVIDER_SITE_OTHER): Payer: Self-pay | Admitting: Women's Health

## 2015-12-12 ENCOUNTER — Ambulatory Visit (INDEPENDENT_AMBULATORY_CARE_PROVIDER_SITE_OTHER): Payer: Medicaid Other

## 2015-12-12 VITALS — BP 110/60 | HR 72 | Wt 117.0 lb

## 2015-12-12 DIAGNOSIS — Z3A19 19 weeks gestation of pregnancy: Secondary | ICD-10-CM | POA: Diagnosis not present

## 2015-12-12 DIAGNOSIS — Z363 Encounter for antenatal screening for malformations: Secondary | ICD-10-CM

## 2015-12-12 DIAGNOSIS — O98311 Other infections with a predominantly sexual mode of transmission complicating pregnancy, first trimester: Secondary | ICD-10-CM

## 2015-12-12 DIAGNOSIS — Z36 Encounter for antenatal screening of mother: Secondary | ICD-10-CM

## 2015-12-12 DIAGNOSIS — Z3402 Encounter for supervision of normal first pregnancy, second trimester: Secondary | ICD-10-CM

## 2015-12-12 DIAGNOSIS — O98811 Other maternal infectious and parasitic diseases complicating pregnancy, first trimester: Secondary | ICD-10-CM

## 2015-12-12 DIAGNOSIS — Z331 Pregnant state, incidental: Secondary | ICD-10-CM

## 2015-12-12 DIAGNOSIS — A749 Chlamydial infection, unspecified: Secondary | ICD-10-CM

## 2015-12-12 DIAGNOSIS — Z1389 Encounter for screening for other disorder: Secondary | ICD-10-CM

## 2015-12-12 LAB — POCT URINALYSIS DIPSTICK
Glucose, UA: NEGATIVE
Ketones, UA: NEGATIVE
Nitrite, UA: NEGATIVE
PROTEIN UA: NEGATIVE
RBC UA: NEGATIVE

## 2015-12-12 NOTE — Progress Notes (Signed)
Anatomy US at 18+[redacted] weeks GA. Single, active female fetus in a cephalic presentation. Cervix is closed and measures 3.1 cm. Posterior Gr 0 placenta. FHR 144 bpm. Fluid is normal with SVP 4.5 cm. Bilateral ovaries appear normal. Anatomy complete with no abnormalities visualized. EFW today of 248 g which is consistent with dating.

## 2015-12-12 NOTE — Patient Instructions (Signed)

## 2015-12-12 NOTE — Progress Notes (Signed)
Low-risk OB appointment G1P0 6765w4d Estimated Date of Delivery: 05/10/16 BP 110/60 mmHg  Pulse 72  Wt 117 lb (53.071 kg)  LMP 07/27/2015 (Approximate)  BP, weight, and urine reviewed.  Refer to obstetrical flow sheet for FH & FHR.  Reports good fm.  Denies regular uc's, lof, vb, or uti s/s. No complaints. Says she never got message on 3/16 to return call, unaware CT still +, to pick up rx today that fran sent. FOB in jail, hasn't had sex w/ him since 1st treatment.  Reviewed today's normal anatomy u/s, warning s/s to report. Plan:  Continue routine obstetrical care  F/U in 4wks for OB appointment  Declined flu shot

## 2015-12-26 ENCOUNTER — Ambulatory Visit (INDEPENDENT_AMBULATORY_CARE_PROVIDER_SITE_OTHER): Payer: Self-pay | Admitting: Obstetrics and Gynecology

## 2015-12-26 ENCOUNTER — Encounter: Payer: Self-pay | Admitting: Obstetrics and Gynecology

## 2015-12-26 VITALS — BP 116/60 | HR 80 | Wt 115.0 lb

## 2015-12-26 DIAGNOSIS — Z3402 Encounter for supervision of normal first pregnancy, second trimester: Secondary | ICD-10-CM

## 2015-12-26 DIAGNOSIS — Z1389 Encounter for screening for other disorder: Secondary | ICD-10-CM

## 2015-12-26 DIAGNOSIS — Z331 Pregnant state, incidental: Secondary | ICD-10-CM

## 2015-12-26 LAB — POCT URINALYSIS DIPSTICK
GLUCOSE UA: NEGATIVE
Ketones, UA: NEGATIVE
Nitrite, UA: NEGATIVE
RBC UA: NEGATIVE

## 2015-12-26 MED ORDER — FLEET ENEMA 7-19 GM/118ML RE ENEM: 1.0000 | ENEMA | Freq: Once | RECTAL | Status: DC

## 2015-12-26 MED ORDER — TERCONAZOLE 0.4 % VA CREA: 1.0000 | TOPICAL_CREAM | Freq: Every day | VAGINAL | Status: DC

## 2015-12-26 NOTE — Progress Notes (Signed)
Patient ID: Jacqueline Ellison, female   DOB: 07/09/1996, 20 y.o.   MRN: [id number]  G1P0 5941w4d Estimated Date of Delivery: 05/10/16  Blood pressure 116/60, pulse 80, weight 115 lb (52.164 kg), last menstrual period 07/27/2015.   refer to the ob flow sheet for FH and FHR, also BP, Wt, Urine results:notable for trace protein, 2+ leukocytes  Patient reports  + good fetal movement, denies any bleeding and no rupture of membranes symptoms or regular contractions. Patient complaints: Patient WORKED IN for intermittent 10-minute episodes of left groin pain every 45 minutes that began this morning, about 4-6 hours ago. Patient states she last had a BM this morning although it did not affect her pain. She denies fever, chills, dysuria, hematuria, or vaginal bleeding.  Patient states she does feel fetal movement.   Physical Exam Pelvic - Normal external genitalia Vagina: heavy, thick, yellow discharge consistent with yeast  FHR: 142 bpm  Wet Prep: Positive for yeast , wbc, neg for trich  Questions were answered. Assessment: LROB G1P0 @ 5041w4d    Monilial vaginitis.   LLQ, Left groin pain secondary to constipation.   Plan:  Continued routine obstetrical care,  Rx terazol 7 Advised Fleets enema.   F/u as scheduled for pnx care    By signing my name below, I, Ronney LionSuzanne Le, attest that this documentation has been prepared under the direction and in the presence of Tilda BurrowJohn Bernardina Cacho V, MD. Electronically Signed: Ronney LionSuzanne Le, ED Scribe. 12/26/2015. 3:36 PM.  I personally performed the services described in this documentation, which was SCRIBED in my presence. The recorded information has been reviewed and considered accurate. It has been edited as necessary during review. Tilda BurrowFERGUSON,Dustee Bottenfield V, MD

## 2016-01-09 ENCOUNTER — Encounter: Payer: Self-pay | Admitting: Women's Health

## 2016-01-14 ENCOUNTER — Encounter: Payer: Self-pay | Admitting: Women's Health

## 2016-01-14 ENCOUNTER — Ambulatory Visit (INDEPENDENT_AMBULATORY_CARE_PROVIDER_SITE_OTHER): Payer: Self-pay | Admitting: Women's Health

## 2016-01-14 VITALS — BP 104/54 | HR 64 | Wt 115.0 lb

## 2016-01-14 DIAGNOSIS — Z3402 Encounter for supervision of normal first pregnancy, second trimester: Secondary | ICD-10-CM

## 2016-01-14 DIAGNOSIS — F1290 Cannabis use, unspecified, uncomplicated: Secondary | ICD-10-CM | POA: Insufficient documentation

## 2016-01-14 DIAGNOSIS — Z331 Pregnant state, incidental: Secondary | ICD-10-CM

## 2016-01-14 DIAGNOSIS — F1210 Cannabis abuse, uncomplicated: Secondary | ICD-10-CM

## 2016-01-14 DIAGNOSIS — F329 Major depressive disorder, single episode, unspecified: Secondary | ICD-10-CM | POA: Insufficient documentation

## 2016-01-14 DIAGNOSIS — O98312 Other infections with a predominantly sexual mode of transmission complicating pregnancy, second trimester: Secondary | ICD-10-CM

## 2016-01-14 DIAGNOSIS — F32A Depression, unspecified: Secondary | ICD-10-CM | POA: Insufficient documentation

## 2016-01-14 DIAGNOSIS — O98812 Other maternal infectious and parasitic diseases complicating pregnancy, second trimester: Secondary | ICD-10-CM

## 2016-01-14 DIAGNOSIS — A749 Chlamydial infection, unspecified: Secondary | ICD-10-CM

## 2016-01-14 DIAGNOSIS — Z1389 Encounter for screening for other disorder: Secondary | ICD-10-CM

## 2016-01-14 HISTORY — DX: Cannabis use, unspecified, uncomplicated: F12.90

## 2016-01-14 LAB — POCT URINALYSIS DIPSTICK
Blood, UA: NEGATIVE
GLUCOSE UA: NEGATIVE
KETONES UA: NEGATIVE
Nitrite, UA: NEGATIVE
Protein, UA: NEGATIVE

## 2016-01-14 NOTE — Patient Instructions (Signed)
You will have your sugar test next visit.  Please do not eat or drink anything after midnight the night before you come, not even water.  You will be here for at least two hours.     Call the office ([phone]) or go to [facility] if:  You begin to have strong, frequent contractions  Your water breaks.  Sometimes it is a big gush of fluid, sometimes it is just a trickle that keeps getting your panties wet or running down your legs  You have vaginal bleeding.  It is normal to have a small amount of spotting if your cervix was checked.   You don't feel your baby moving like normal.  If you don't, get you something to eat and drink and lay down and focus on feeling your baby move.   If your baby is still not moving like normal, you should call the office or go to [facility].  Second Trimester of Pregnancy The second trimester is from week 13 through week 28, months 4 through 6. The second trimester is often a time when you feel your best. Your body has also adjusted to being pregnant, and you begin to feel better physically. Usually, morning sickness has lessened or quit completely, you may have more energy, and you may have an increase in appetite. The second trimester is also a time when the fetus is growing rapidly. At the end of the sixth month, the fetus is about 9 inches long and weighs about 1 pounds. You will likely begin to feel the baby move (quickening) between 18 and 20 weeks of the pregnancy. BODY CHANGES Your body goes through many changes during pregnancy. The changes vary from woman to woman.   Your weight will continue to increase. You will notice your lower abdomen bulging out.  You may begin to get stretch marks on your hips, abdomen, and breasts.  You may develop headaches that can be relieved by medicines approved by your health care provider.  You may urinate more often because the fetus is pressing on your bladder.  You may develop or continue to have  heartburn as a result of your pregnancy.  You may develop constipation because certain hormones are causing the muscles that push waste through your intestines to slow down.  You may develop hemorrhoids or swollen, bulging veins (varicose veins).  You may have back pain because of the weight gain and pregnancy hormones relaxing your joints between the bones in your pelvis and as a result of a shift in weight and the muscles that support your balance.  Your breasts will continue to grow and be tender.  Your gums may bleed and may be sensitive to brushing and flossing.  Dark spots or blotches (chloasma, mask of pregnancy) may develop on your face. This will likely fade after the baby is born.  A dark line from your belly button to the pubic area (linea nigra) may appear. This will likely fade after the baby is born.  You may have changes in your hair. These can include thickening of your hair, rapid growth, and changes in texture. Some women also have hair loss during or after pregnancy, or hair that feels dry or thin. Your hair will most likely return to normal after your baby is born. WHAT TO EXPECT AT YOUR PRENATAL VISITS During a routine prenatal visit:  You will be weighed to make sure you and the fetus are growing normally.  Your blood pressure will be taken.    Your abdomen will be measured to track your baby's growth.  The fetal heartbeat will be listened to.  Any test results from the previous visit will be discussed. Your health care provider may ask you:  How you are feeling.  If you are feeling the baby move.  If you have had any abnormal symptoms, such as leaking fluid, bleeding, severe headaches, or abdominal cramping.  If you have any questions. Other tests that may be performed during your second trimester include:  Blood tests that check for:  Low iron levels (anemia).  Gestational diabetes (between 24 and 28 weeks).  Rh antibodies.  Urine tests to check  for infections, diabetes, or protein in the urine.  An ultrasound to confirm the proper growth and development of the baby.  An amniocentesis to check for possible genetic problems.  Fetal screens for spina bifida and Down syndrome. HOME CARE INSTRUCTIONS   Avoid all smoking, herbs, alcohol, and unprescribed drugs. These chemicals affect the formation and growth of the baby.  Follow your health care provider's instructions regarding medicine use. There are medicines that are either safe or unsafe to take during pregnancy.  Exercise only as directed by your health care provider. Experiencing uterine cramps is a good sign to stop exercising.  Continue to eat regular, healthy meals.  Wear a good support bra for breast tenderness.  Do not use hot tubs, steam rooms, or saunas.  Wear your seat belt at all times when driving.  Avoid raw meat, uncooked cheese, cat litter boxes, and soil used by cats. These carry germs that can cause birth defects in the baby.  Take your prenatal vitamins.  Try taking a stool softener (if your health care provider approves) if you develop constipation. Eat more high-fiber foods, such as fresh vegetables or fruit and whole grains. Drink plenty of fluids to keep your urine clear or pale yellow.  Take warm sitz baths to soothe any pain or discomfort caused by hemorrhoids. Use hemorrhoid cream if your health care provider approves.  If you develop varicose veins, wear support hose. Elevate your feet for 15 minutes, 3-4 times a day. Limit salt in your diet.  Avoid heavy lifting, wear low heel shoes, and practice good posture.  Rest with your legs elevated if you have leg cramps or low back pain.  Visit your dentist if you have not gone yet during your pregnancy. Use a soft toothbrush to brush your teeth and be gentle when you floss.  A sexual relationship may be continued unless your health care provider directs you otherwise.  Continue to go to all your  prenatal visits as directed by your health care provider. SEEK MEDICAL CARE IF:   You have dizziness.  You have mild pelvic cramps, pelvic pressure, or nagging pain in the abdominal area.  You have persistent nausea, vomiting, or diarrhea.  You have a bad smelling vaginal discharge.  You have pain with urination. SEEK IMMEDIATE MEDICAL CARE IF:   You have a fever.  You are leaking fluid from your vagina.  You have spotting or bleeding from your vagina.  You have severe abdominal cramping or pain.  You have rapid weight gain or loss.  You have shortness of breath with chest pain.  You notice sudden or extreme swelling of your face, hands, ankles, feet, or legs.  You have not felt your baby move in over an hour.  You have severe headaches that do not go away with medicine.  You have vision changes.   Document Released: 08/26/2001 Document Revised: 09/06/2013 Document Reviewed: 11/02/2012 Exit[employer] Patient Information 2015 [employer]. This information is not intended to replace advice given to you by your health care provider. Make sure you discuss any questions you have with your health care provider.     

## 2016-01-14 NOTE — Progress Notes (Signed)
Low-risk OB appointment G1P0 2057w2d Estimated Date of Delivery: 05/10/16 BP 104/54 mmHg  Pulse 64  Wt 115 lb (52.164 kg)  LMP 07/27/2015 (Approximate)  BP, weight, and urine reviewed.  Refer to obstetrical flow sheet for FH & FHR.  Reports good fm.  Denies regular uc's, lof, vb, or uti s/s. Started crying, states she feels depressed all the time. Eating ok, not sleeping, not finding joy in things she used to, denies SI/HI. Discussed options of counseling +/- meds- declines meds at this time, [facility] to let us know if she hasn't heard from them w/in 1wk. To call back if changes mind about meds. Currently on probation x 1469yr- doesn't want to disclose reason. FOB is in jail. THC last <30d ago- advised complete cessation +CT in Feb, poc was +, states she took meds again ~4wks ago and has not had sex since, will recheck poc today Reviewed ptl s/s, fm. Plan:  Continue routine obstetrical care  F/U in 4wks for OB appointment and pn2

## 2016-01-15 LAB — GC/CHLAMYDIA PROBE AMP
Chlamydia trachomatis, NAA: POSITIVE — AB
NEISSERIA GONORRHOEAE BY PCR: NEGATIVE

## 2016-01-16 ENCOUNTER — Telehealth: Payer: Self-pay | Admitting: Women's Health

## 2016-01-16 DIAGNOSIS — O98811 Other maternal infectious and parasitic diseases complicating pregnancy, first trimester: Principal | ICD-10-CM

## 2016-01-16 DIAGNOSIS — A749 Chlamydial infection, unspecified: Secondary | ICD-10-CM

## 2016-01-16 MED ORDER — AMOXICILLIN 500 MG PO CAPS: 500.0000 mg | ORAL_CAPSULE | Freq: Three times a day (TID) | ORAL | Status: DC

## 2016-01-16 NOTE — Telephone Encounter (Signed)
Called to notify pt CT still + after tx x 2 w/ azithromycin. Pt not available, left message w/ woman who answered phone to have her return my call. Rx amoxicillin 500mg  TID x 7d sent to pharmacy, NO sex until poc.  Cheral MarkerKimberly R. Tanesha Arambula, CNM, [facility] 01/16/2016 4:02 PM

## 2016-01-17 ENCOUNTER — Telehealth: Payer: Self-pay | Admitting: *Deleted

## 2016-01-17 NOTE — Telephone Encounter (Signed)
Pt informed + Chlamydia results, Amoxicillin e-scribed, Take all the pills as directed, no sex until POC. Appt scheduled for Feb 12, 2016 with Cathie BeamsFran Cresenzo-Dishmon, CNM. Pt verbalized understanding.

## 2016-02-12 ENCOUNTER — Encounter: Payer: Self-pay | Admitting: Advanced Practice Midwife

## 2016-02-12 ENCOUNTER — Other Ambulatory Visit: Payer: Self-pay

## 2016-02-14 ENCOUNTER — Other Ambulatory Visit: Payer: Medicaid Other

## 2016-02-14 ENCOUNTER — Encounter: Payer: Self-pay | Admitting: Advanced Practice Midwife

## 2016-02-14 ENCOUNTER — Ambulatory Visit (INDEPENDENT_AMBULATORY_CARE_PROVIDER_SITE_OTHER): Payer: Medicaid Other | Admitting: Advanced Practice Midwife

## 2016-02-14 VITALS — BP 102/60 | HR 76 | Wt 118.0 lb

## 2016-02-14 DIAGNOSIS — Z3403 Encounter for supervision of normal first pregnancy, third trimester: Secondary | ICD-10-CM

## 2016-02-14 DIAGNOSIS — O98811 Other maternal infectious and parasitic diseases complicating pregnancy, first trimester: Secondary | ICD-10-CM

## 2016-02-14 DIAGNOSIS — A749 Chlamydial infection, unspecified: Secondary | ICD-10-CM

## 2016-02-14 DIAGNOSIS — O98313 Other infections with a predominantly sexual mode of transmission complicating pregnancy, third trimester: Secondary | ICD-10-CM

## 2016-02-14 DIAGNOSIS — Z3A28 28 weeks gestation of pregnancy: Secondary | ICD-10-CM

## 2016-02-14 DIAGNOSIS — Z331 Pregnant state, incidental: Secondary | ICD-10-CM

## 2016-02-14 DIAGNOSIS — Z1389 Encounter for screening for other disorder: Secondary | ICD-10-CM

## 2016-02-14 DIAGNOSIS — Z3402 Encounter for supervision of normal first pregnancy, second trimester: Secondary | ICD-10-CM

## 2016-02-14 LAB — POCT URINALYSIS DIPSTICK
GLUCOSE UA: NEGATIVE
Ketones, UA: NEGATIVE
Nitrite, UA: NEGATIVE
RBC UA: NEGATIVE

## 2016-02-14 MED ORDER — PROMETHAZINE HCL 12.5 MG PO TABS: 12.5000 mg | ORAL_TABLET | Freq: Four times a day (QID) | ORAL | Status: DC | PRN

## 2016-02-14 NOTE — Progress Notes (Signed)
G1P0 7033w5d Estimated Date of Delivery: 05/10/16  Blood pressure 102/60, pulse 76, weight 118 lb (53.524 kg), last menstrual period 07/27/2015.   BP weight and urine results all reviewed and noted.  Please refer to the obstetrical flow sheet for the fundal height and fetal heart rate documentation:  Patient reports good fetal movement, denies any bleeding and no rupture of membranes symptoms or regular contractions. Patient is not enjoying pregnancy at all.  Uses maternity belt.   All questions were answered.  Orders Placed This Encounter  Procedures  . GC/Chlamydia Probe Amp  . POCT urinalysis dipstick    Plan:  Continued routine obstetrical care, PN2 today; POC CHL  Return in about 3 weeks (around 03/06/2016) for LROB.

## 2016-02-14 NOTE — Patient Instructions (Signed)

## 2016-02-15 LAB — CBC
Hematocrit: 34 % (ref 34.0–46.6)
Hemoglobin: 11.8 g/dL (ref 11.1–15.9)
MCH: 30.8 pg (ref 26.6–33.0)
MCHC: 34.7 g/dL (ref 31.5–35.7)
MCV: 89 fL (ref 79–97)
Platelets: 188 10*3/uL (ref 150–379)
RBC: 3.83 x10E6/uL (ref 3.77–5.28)
RDW: 13.1 % (ref 12.3–15.4)
WBC: 7.7 10*3/uL (ref 3.4–10.8)

## 2016-02-15 LAB — GLUCOSE TOLERANCE, 2 HOURS W/ 1HR
GLUCOSE, 1 HOUR: 144 mg/dL (ref 65–179)
GLUCOSE, 2 HOUR: 95 mg/dL (ref 65–152)
Glucose, Fasting: 81 mg/dL (ref 65–91)

## 2016-02-15 LAB — HIV ANTIBODY (ROUTINE TESTING W REFLEX): HIV Screen 4th Generation wRfx: NONREACTIVE

## 2016-02-15 LAB — RPR: RPR: NONREACTIVE

## 2016-02-15 LAB — ANTIBODY SCREEN: ANTIBODY SCREEN: NEGATIVE

## 2016-02-16 LAB — GC/CHLAMYDIA PROBE AMP
Chlamydia trachomatis, NAA: POSITIVE — AB
Neisseria gonorrhoeae by PCR: NEGATIVE

## 2016-02-19 ENCOUNTER — Encounter (HOSPITAL_COMMUNITY): Payer: Self-pay | Admitting: Emergency Medicine

## 2016-02-19 ENCOUNTER — Emergency Department (HOSPITAL_COMMUNITY)
Admission: EM | Admit: 2016-02-19 | Discharge: 2016-02-19 | Disposition: A | Discharge status: Home / Self Care | Payer: Medicaid Other | Attending: Dermatology | Admitting: Dermatology

## 2016-02-19 DIAGNOSIS — O219 Vomiting of pregnancy, unspecified: Secondary | ICD-10-CM | POA: Diagnosis not present

## 2016-02-19 DIAGNOSIS — R109 Unspecified abdominal pain: Secondary | ICD-10-CM | POA: Diagnosis not present

## 2016-02-19 DIAGNOSIS — O26892 Other specified pregnancy related conditions, second trimester: Secondary | ICD-10-CM | POA: Diagnosis present

## 2016-02-19 DIAGNOSIS — O9989 Other specified diseases and conditions complicating pregnancy, childbirth and the puerperium: Secondary | ICD-10-CM | POA: Diagnosis not present

## 2016-02-19 DIAGNOSIS — M1990 Unspecified osteoarthritis, unspecified site: Secondary | ICD-10-CM | POA: Insufficient documentation

## 2016-02-19 DIAGNOSIS — Z791 Long term (current) use of non-steroidal anti-inflammatories (NSAID): Secondary | ICD-10-CM | POA: Insufficient documentation

## 2016-02-19 DIAGNOSIS — M549 Dorsalgia, unspecified: Secondary | ICD-10-CM | POA: Diagnosis not present

## 2016-02-19 DIAGNOSIS — Z5321 Procedure and treatment not carried out due to patient leaving prior to being seen by health care provider: Secondary | ICD-10-CM | POA: Insufficient documentation

## 2016-02-19 DIAGNOSIS — Z3A28 28 weeks gestation of pregnancy: Secondary | ICD-10-CM | POA: Insufficient documentation

## 2016-02-19 DIAGNOSIS — Z87891 Personal history of nicotine dependence: Secondary | ICD-10-CM | POA: Insufficient documentation

## 2016-02-19 LAB — CBC
HCT: 34.1 % — ABNORMAL LOW (ref 36.0–46.0)
Hemoglobin: 11.6 g/dL — ABNORMAL LOW (ref 12.0–15.0)
MCH: 30.1 pg (ref 26.0–34.0)
MCHC: 34 g/dL (ref 30.0–36.0)
MCV: 88.3 fL (ref 78.0–100.0)
PLATELETS: 190 10*3/uL (ref 150–400)
RBC: 3.86 MIL/uL — AB (ref 3.87–5.11)
RDW: 12.6 % (ref 11.5–15.5)
WBC: 7.2 10*3/uL (ref 4.0–10.5)

## 2016-02-19 LAB — COMPREHENSIVE METABOLIC PANEL
ALT: 17 U/L (ref 14–54)
ANION GAP: 8 (ref 5–15)
AST: 19 U/L (ref 15–41)
Albumin: 3.2 g/dL — ABNORMAL LOW (ref 3.5–5.0)
Alkaline Phosphatase: 77 U/L (ref 38–126)
BILIRUBIN TOTAL: 0.2 mg/dL — AB (ref 0.3–1.2)
BUN: 5 mg/dL — ABNORMAL LOW (ref 6–20)
CO2: 23 mmol/L (ref 22–32)
Calcium: 8.2 mg/dL — ABNORMAL LOW (ref 8.9–10.3)
Chloride: 103 mmol/L (ref 101–111)
Creatinine, Ser: 0.6 mg/dL (ref 0.44–1.00)
Glucose, Bld: 95 mg/dL (ref 65–99)
POTASSIUM: 3.9 mmol/L (ref 3.5–5.1)
Sodium: 134 mmol/L — ABNORMAL LOW (ref 135–145)
TOTAL PROTEIN: 6.4 g/dL — AB (ref 6.5–8.1)

## 2016-02-19 LAB — URINALYSIS, ROUTINE W REFLEX MICROSCOPIC
BILIRUBIN URINE: NEGATIVE
GLUCOSE, UA: NEGATIVE mg/dL
HGB URINE DIPSTICK: NEGATIVE
Ketones, ur: NEGATIVE mg/dL
Nitrite: NEGATIVE
PROTEIN: NEGATIVE mg/dL
Specific Gravity, Urine: <1.005 — ABNORMAL LOW (ref 1.005–1.030)
pH: 6 (ref 5.0–8.0)

## 2016-02-19 LAB — URINE MICROSCOPIC-ADD ON: RBC / HPF: NONE SEEN RBC/hpf (ref 0–5)

## 2016-02-19 NOTE — ED Notes (Addendum)
Pt reports is 7 months pregnant and reports abd pain today, vomiting since last night, intermittent back pain today. Pt reports has felt fetal movement.   FHR in triage 150.

## 2016-02-19 NOTE — ED Notes (Signed)
PT called x3 from triage with no answer. 

## 2016-02-19 NOTE — ED Notes (Addendum)
Pt brought urine sample to triage. Pt reported, " [facility]". Pt explained that could not tell her to go to another hospital but reported if anything was related to the pregnancy today that patient could be [facility] to [facility] and that patient was next in line to be seen in this ER. Pt reported would talk with sister and decide whether or not to stay.

## 2016-02-20 ENCOUNTER — Telehealth: Payer: Self-pay | Admitting: Women's Health

## 2016-02-20 DIAGNOSIS — A749 Chlamydial infection, unspecified: Secondary | ICD-10-CM

## 2016-02-20 DIAGNOSIS — O98811 Other maternal infectious and parasitic diseases complicating pregnancy, first trimester: Principal | ICD-10-CM

## 2016-02-20 NOTE — Telephone Encounter (Signed)
Calling to notify pt CT still + after 3 treatments. Need to make sure both she and partner are taking med when rx'd. No answer/no voicemail. Will continue calling.  Cheral MarkerKimberly R. Jia Dottavio, CNM, WHNP-BC 02/20/2016 1:13 PM

## 2016-02-26 ENCOUNTER — Telehealth: Payer: Self-pay | Admitting: Women's Health

## 2016-02-26 NOTE — Telephone Encounter (Signed)
Zollie ScaleOlivia called back and she states she has not been sexually active since she found out she was pregnant.  She states she gets sick when she takes the abx and wants to know if there is an alternative such as an injection.  Her cell phone is turned off right now so she will call us back about 3pm, there is no # we can call her at.

## 2016-02-26 NOTE — Telephone Encounter (Signed)
Spoke w/ someone at listed number who stated pt not available, took message for pt to call us back. Need to notify CT still + after tx x 3.  Cheral MarkerKimberly R. San Lohmeyer, CNM, [facility] 02/26/2016 2:15 PM

## 2016-02-28 ENCOUNTER — Ambulatory Visit (INDEPENDENT_AMBULATORY_CARE_PROVIDER_SITE_OTHER): Payer: Medicaid Other | Admitting: Obstetrics and Gynecology

## 2016-02-28 ENCOUNTER — Encounter: Payer: Self-pay | Admitting: Obstetrics and Gynecology

## 2016-02-28 VITALS — BP 110/62 | HR 78 | Wt 121.0 lb

## 2016-02-28 DIAGNOSIS — A749 Chlamydial infection, unspecified: Secondary | ICD-10-CM

## 2016-02-28 DIAGNOSIS — Z1389 Encounter for screening for other disorder: Secondary | ICD-10-CM

## 2016-02-28 DIAGNOSIS — Z331 Pregnant state, incidental: Secondary | ICD-10-CM

## 2016-02-28 DIAGNOSIS — O98311 Other infections with a predominantly sexual mode of transmission complicating pregnancy, first trimester: Secondary | ICD-10-CM | POA: Diagnosis not present

## 2016-02-28 DIAGNOSIS — Z3403 Encounter for supervision of normal first pregnancy, third trimester: Secondary | ICD-10-CM

## 2016-02-28 DIAGNOSIS — O98811 Other maternal infectious and parasitic diseases complicating pregnancy, first trimester: Secondary | ICD-10-CM

## 2016-02-28 LAB — POCT URINALYSIS DIPSTICK
Blood, UA: NEGATIVE
GLUCOSE UA: NEGATIVE
KETONES UA: NEGATIVE
LEUKOCYTES UA: NEGATIVE
NITRITE UA: NEGATIVE
Protein, UA: NEGATIVE

## 2016-02-28 MED ORDER — MICONAZOLE NITRATE 2 % VA CREA: 1.0000 | TOPICAL_CREAM | Freq: Every day | VAGINAL | Status: DC

## 2016-02-28 MED ORDER — FLUCONAZOLE 150 MG PO TABS: 150.0000 mg | ORAL_TABLET | Freq: Once | ORAL | Status: DC

## 2016-02-28 MED ORDER — AZITHROMYCIN 500 MG PO TABS: 1000.0000 mg | ORAL_TABLET | ORAL | Status: DC

## 2016-02-28 NOTE — Progress Notes (Signed)
Patient ID: Jacqueline Ellison, female   DOB: [birth year], 20 y.o.   MRN: [id number] WORK-IN APPOINTMENT G1P0 3410w5d [facility]: [facility]  Patient reports  + good fetal movement, denies any bleeding and no rupture of membranes symptoms or regular contractions. Patient complaints: Patient is WORKED IN today for 2 intermittent episodes of vaginal bleeding that began 1 week ago. Per chart review, patient has had a positive chlamydia test the last 5 times she has been tested, since 04/2014. Patient states she has been treated twice for chlamydia, with no cure. She states she has not had sexual activity since she became pregnant about 8 months ago.  Blood pressure 110/62, pulse 78, weight 121 lb (54.885 kg), last menstrual period 07/27/2015.  refer to the ob flow sheet for FH and FHR, also BP, Wt, Urine results:notable for negative                     FHR 146 bpm  Physical Exam Vagina: moderate yeast per vagina Cervix: closed  Wet Prep: Positive for yeast and WBC's; otherwise negative   Questions were answered. Assessment: LROB G1P0 @ 3610w5d    Monila cervicitis vaginitis   Positive chlamydia testing will RE-tx again , 1000 mg Azitromycin q wk x 2.    Plan:  Continued routine obstetrical care,   Rx Diflucan, monistat  Rx azithromycin  F/u as scheduled for pnx care     By signing my name below, I, Jacqueline Ellison, attest that this documentation has been prepared under the direction and in the presence of Tilda BurrowJohn V [name], MD. Electronically Signed: Ronney LionSuzanne Ellison, ED Scribe. 02/28/2016. 2:53 PM.  I personally performed the services described in this documentation, which was SCRIBED in my presence. The recorded information has been reviewed and considered accurate. It has been edited as necessary during review. Tilda BurrowFERGUSON,[name], MD

## 2016-02-28 NOTE — Progress Notes (Signed)
Pt worked in today for vaginal bleeding

## 2016-03-06 ENCOUNTER — Encounter: Payer: Medicaid Other | Admitting: Advanced Practice Midwife

## 2016-03-13 ENCOUNTER — Ambulatory Visit (INDEPENDENT_AMBULATORY_CARE_PROVIDER_SITE_OTHER): Payer: Medicaid Other | Admitting: Advanced Practice Midwife

## 2016-03-13 ENCOUNTER — Encounter: Payer: Self-pay | Admitting: Advanced Practice Midwife

## 2016-03-13 VITALS — BP 118/80 | HR 90 | Wt 117.0 lb

## 2016-03-13 DIAGNOSIS — Z331 Pregnant state, incidental: Secondary | ICD-10-CM

## 2016-03-13 DIAGNOSIS — Z3A32 32 weeks gestation of pregnancy: Secondary | ICD-10-CM

## 2016-03-13 DIAGNOSIS — Z3403 Encounter for supervision of normal first pregnancy, third trimester: Secondary | ICD-10-CM

## 2016-03-13 DIAGNOSIS — Z1389 Encounter for screening for other disorder: Secondary | ICD-10-CM

## 2016-03-13 LAB — POCT URINALYSIS DIPSTICK
Blood, UA: NEGATIVE
Glucose, UA: NEGATIVE
Ketones, UA: NEGATIVE
LEUKOCYTES UA: NEGATIVE
NITRITE UA: NEGATIVE
Protein, UA: NEGATIVE

## 2016-03-13 MED ORDER — OMEPRAZOLE 20 MG PO CPDR: 20.0000 mg | DELAYED_RELEASE_CAPSULE | Freq: Every day | ORAL | Status: DC

## 2016-03-13 NOTE — Progress Notes (Signed)
Pt states that she is having a lot of pelvic pain.

## 2016-03-13 NOTE — Progress Notes (Signed)
G1P0 3779w5d Estimated Date of Delivery: 05/10/16  Blood pressure 118/80, pulse 90, weight 117 lb (53.071 kg), last menstrual period 07/27/2015.   BP weight and urine results all reviewed and noted.  Please refer to the obstetrical flow sheet for the fundal height and fetal heart rate documentation:  Patient reports good fetal movement, denies any bleeding and no rupture of membranes symptoms or regular contractions. Patient is without complaints other than normal pregnancy complaints.   All questions were answered.  Orders Placed This Encounter  Procedures  . POCT urinalysis dipstick    Plan:  Continued routine obstetrical care,   Return in about 2 weeks (around 03/27/2016) for LROB and POC CHL.

## 2016-03-27 ENCOUNTER — Encounter: Payer: Medicaid Other | Admitting: Advanced Practice Midwife

## 2016-03-31 ENCOUNTER — Encounter: Payer: Self-pay | Admitting: Obstetrics and Gynecology

## 2016-03-31 ENCOUNTER — Encounter: Payer: Medicaid Other | Admitting: Obstetrics and Gynecology

## 2016-04-11 ENCOUNTER — Encounter: Payer: Self-pay | Admitting: Obstetrics & Gynecology

## 2016-04-11 ENCOUNTER — Ambulatory Visit (INDEPENDENT_AMBULATORY_CARE_PROVIDER_SITE_OTHER): Payer: Medicaid Other | Admitting: Obstetrics & Gynecology

## 2016-04-11 VITALS — BP 120/80 | HR 76 | Wt 124.0 lb

## 2016-04-11 DIAGNOSIS — Z3A36 36 weeks gestation of pregnancy: Secondary | ICD-10-CM

## 2016-04-11 DIAGNOSIS — Z331 Pregnant state, incidental: Secondary | ICD-10-CM

## 2016-04-11 DIAGNOSIS — O365931 Maternal care for other known or suspected poor fetal growth, third trimester, fetus 1: Secondary | ICD-10-CM

## 2016-04-11 DIAGNOSIS — Z1389 Encounter for screening for other disorder: Secondary | ICD-10-CM

## 2016-04-11 DIAGNOSIS — Z3403 Encounter for supervision of normal first pregnancy, third trimester: Secondary | ICD-10-CM

## 2016-04-11 LAB — POCT URINALYSIS DIPSTICK
Glucose, UA: NEGATIVE
Ketones, UA: NEGATIVE
Leukocytes, UA: NEGATIVE
NITRITE UA: NEGATIVE
PROTEIN UA: NEGATIVE
RBC UA: NEGATIVE

## 2016-04-11 NOTE — Progress Notes (Signed)
G1P0 [redacted]w[redacted]d Estimated Date of Delivery: 05/10/16  Blood pressure 120/80, pulse 76, weight 124 lb (56.2 kg), last menstrual period 07/27/2015.   BP weight and urine results all reviewed and noted.  Please refer to the obstetrical flow sheet for the fundal height and fetal heart rate documentation:  Patient reports good fetal movement, denies any bleeding and no rupture of membranes symptoms or regular contractions. Patient is without complaints. All questions were answered.  Orders Placed This Encounter  Procedures  . US OB Follow Up  . POCT urinalysis dipstick    Plan:  Continued routine obstetrical care, FH progression a bit sluggish, will order sonogram for next week to check EFW  Return in about 1 week (around 04/18/2016) for Sonogram for fetal weight, , LROB, with Dr Despina Hidden.

## 2016-04-18 ENCOUNTER — Ambulatory Visit (INDEPENDENT_AMBULATORY_CARE_PROVIDER_SITE_OTHER): Payer: Medicaid Other | Admitting: Obstetrics & Gynecology

## 2016-04-18 ENCOUNTER — Ambulatory Visit (INDEPENDENT_AMBULATORY_CARE_PROVIDER_SITE_OTHER): Payer: Medicaid Other

## 2016-04-18 VITALS — BP 120/70 | HR 84 | Wt 125.0 lb

## 2016-04-18 DIAGNOSIS — O365931 Maternal care for other known or suspected poor fetal growth, third trimester, fetus 1: Secondary | ICD-10-CM | POA: Diagnosis not present

## 2016-04-18 DIAGNOSIS — O321XX1 Maternal care for breech presentation, fetus 1: Secondary | ICD-10-CM

## 2016-04-18 DIAGNOSIS — Z3403 Encounter for supervision of normal first pregnancy, third trimester: Secondary | ICD-10-CM

## 2016-04-18 DIAGNOSIS — Z1389 Encounter for screening for other disorder: Secondary | ICD-10-CM

## 2016-04-18 DIAGNOSIS — Z331 Pregnant state, incidental: Secondary | ICD-10-CM

## 2016-04-18 DIAGNOSIS — Z3A37 37 weeks gestation of pregnancy: Secondary | ICD-10-CM | POA: Diagnosis not present

## 2016-04-18 DIAGNOSIS — Z369 Encounter for antenatal screening, unspecified: Secondary | ICD-10-CM

## 2016-04-18 LAB — POCT URINALYSIS DIPSTICK
Glucose, UA: NEGATIVE
Ketones, UA: NEGATIVE
Leukocytes, UA: NEGATIVE
NITRITE UA: NEGATIVE
Protein, UA: NEGATIVE
RBC UA: NEGATIVE

## 2016-04-18 MED ORDER — FLUCONAZOLE 150 MG PO TABS: 150.0000 mg | ORAL_TABLET | Freq: Once | ORAL | 0 refills | Status: AC

## 2016-04-18 NOTE — Progress Notes (Signed)
[place] 36+6 wks,Frank breech,post pl gr 3,FHR 144 bpm,bilat adnexa's wnl,afi 14 cm,EFW 2878 38%

## 2016-04-18 NOTE — Progress Notes (Signed)
G1P0 [redacted]w[redacted]d Estimated Date of Delivery: 05/10/16  Blood pressure 120/70, pulse 84, weight 125 lb (56.7 kg), last menstrual period 07/27/2015.   BP weight and urine results all reviewed and noted.  Please refer to the obstetrical flow sheet for the fundal height and fetal heart rate documentation:  Patient reports good fetal movement, denies any bleeding and no rupture of membranes symptoms or regular contractions. Patient is without complaints. All questions were answered.  Orders Placed This Encounter  Procedures  . Strep Gp B NAA  . GC/Chlamydia Probe Amp  . POCT urinalysis dipstick    Plan:  Continued routine obstetrical care, breech presentation declines ECV attempt, breech is engaged low in the pelvis Primary Caesarean section 05/05/2016 @0730   LHE  Return in about 1 week (around 04/25/2016) for LROB, with Dr Despina Hidden.

## 2016-04-18 NOTE — Addendum Note (Signed)
Addended by: Lazaro Arms on: 04/18/2016 02:03 PM   Modules accepted: Orders

## 2016-04-20 LAB — STREP GP B NAA: Strep Gp B NAA: NEGATIVE

## 2016-04-22 LAB — GC/CHLAMYDIA PROBE AMP
CHLAMYDIA, DNA PROBE: NEGATIVE
NEISSERIA GONORRHOEAE BY PCR: NEGATIVE

## 2016-04-23 ENCOUNTER — Encounter: Payer: Self-pay | Admitting: Obstetrics & Gynecology

## 2016-04-23 ENCOUNTER — Ambulatory Visit (INDEPENDENT_AMBULATORY_CARE_PROVIDER_SITE_OTHER): Payer: Medicaid Other | Admitting: Obstetrics & Gynecology

## 2016-04-23 VITALS — BP 110/80 | HR 92 | Wt 127.3 lb

## 2016-04-23 DIAGNOSIS — Z3A38 38 weeks gestation of pregnancy: Secondary | ICD-10-CM

## 2016-04-23 DIAGNOSIS — O321XX1 Maternal care for breech presentation, fetus 1: Secondary | ICD-10-CM

## 2016-04-23 DIAGNOSIS — Z3403 Encounter for supervision of normal first pregnancy, third trimester: Secondary | ICD-10-CM

## 2016-04-23 DIAGNOSIS — Z331 Pregnant state, incidental: Secondary | ICD-10-CM

## 2016-04-23 DIAGNOSIS — Z1389 Encounter for screening for other disorder: Secondary | ICD-10-CM

## 2016-04-23 LAB — POCT URINALYSIS DIPSTICK
Blood, UA: NEGATIVE
Glucose, UA: NEGATIVE
Ketones, UA: NEGATIVE
LEUKOCYTES UA: NEGATIVE
Nitrite, UA: NEGATIVE

## 2016-04-23 NOTE — Progress Notes (Signed)
G1P0 5175w4d Estimated Date of Delivery: 05/10/16  Blood pressure 110/80, pulse 92, weight 127 lb 4.8 oz (57.7 kg), last menstrual period 07/27/2015.   BP weight and urine results all reviewed and noted.  Please refer to the obstetrical flow sheet for the fundal height and fetal heart rate documentation:  Patient reports good fetal movement, denies any bleeding and no rupture of membranes symptoms or regular contractions. Patient is without complaints. All questions were answered.  Orders Placed This Encounter  Procedures  . POCT urinalysis dipstick    Plan:  Continued routine obstetrical care, Breech, Caesarean section 8/21@0730   Return in about 9 days (around 05/02/2016) for LROB, with Dr Despina HiddenEure.

## 2016-04-25 ENCOUNTER — Inpatient Hospital Stay (HOSPITAL_COMMUNITY): Payer: Medicaid Other | Admitting: Anesthesiology

## 2016-04-25 ENCOUNTER — Inpatient Hospital Stay (HOSPITAL_COMMUNITY)
Admission: AD | Admit: 2016-04-25 | Discharge: 2016-04-28 | DRG: 766 | Disposition: A | Discharge status: Home / Self Care | Payer: Medicaid Other | Source: Ambulatory Visit | Attending: Obstetrics and Gynecology | Admitting: Obstetrics and Gynecology

## 2016-04-25 ENCOUNTER — Telehealth (HOSPITAL_COMMUNITY): Payer: Self-pay | Admitting: *Deleted

## 2016-04-25 ENCOUNTER — Encounter (HOSPITAL_COMMUNITY)
Admission: AD | Disposition: A | Discharge status: Home / Self Care | Payer: Self-pay | Source: Ambulatory Visit | Attending: Obstetrics and Gynecology

## 2016-04-25 ENCOUNTER — Telehealth: Payer: Self-pay | Admitting: *Deleted

## 2016-04-25 ENCOUNTER — Encounter (HOSPITAL_COMMUNITY): Payer: Self-pay | Admitting: *Deleted

## 2016-04-25 DIAGNOSIS — Z833 Family history of diabetes mellitus: Secondary | ICD-10-CM

## 2016-04-25 DIAGNOSIS — O99334 Smoking (tobacco) complicating childbirth: Secondary | ICD-10-CM | POA: Diagnosis present

## 2016-04-25 DIAGNOSIS — Z3A37 37 weeks gestation of pregnancy: Secondary | ICD-10-CM

## 2016-04-25 DIAGNOSIS — F17210 Nicotine dependence, cigarettes, uncomplicated: Secondary | ICD-10-CM | POA: Diagnosis present

## 2016-04-25 DIAGNOSIS — Z8249 Family history of ischemic heart disease and other diseases of the circulatory system: Secondary | ICD-10-CM

## 2016-04-25 DIAGNOSIS — M549 Dorsalgia, unspecified: Secondary | ICD-10-CM | POA: Diagnosis present

## 2016-04-25 DIAGNOSIS — IMO0001 Reserved for inherently not codable concepts without codable children: Secondary | ICD-10-CM

## 2016-04-25 DIAGNOSIS — Z3403 Encounter for supervision of normal first pregnancy, third trimester: Secondary | ICD-10-CM

## 2016-04-25 DIAGNOSIS — O321XX0 Maternal care for breech presentation, not applicable or unspecified: Secondary | ICD-10-CM | POA: Diagnosis present

## 2016-04-25 HISTORY — DX: Chlamydial infection, unspecified: A74.9

## 2016-04-25 LAB — TYPE AND SCREEN
ABO/RH(D): A POS
ANTIBODY SCREEN: NEGATIVE

## 2016-04-25 LAB — URINALYSIS, ROUTINE W REFLEX MICROSCOPIC
BILIRUBIN URINE: NEGATIVE
Glucose, UA: NEGATIVE mg/dL
Hgb urine dipstick: NEGATIVE
KETONES UR: NEGATIVE mg/dL
LEUKOCYTES UA: NEGATIVE
NITRITE: NEGATIVE
Protein, ur: NEGATIVE mg/dL
SPECIFIC GRAVITY, URINE: 1.015 (ref 1.005–1.030)
pH: 8 (ref 5.0–8.0)

## 2016-04-25 LAB — CBC
HEMATOCRIT: 36.6 % (ref 36.0–46.0)
HEMOGLOBIN: 12.7 g/dL (ref 12.0–15.0)
MCH: 29.4 pg (ref 26.0–34.0)
MCHC: 34.7 g/dL (ref 30.0–36.0)
MCV: 84.7 fL (ref 78.0–100.0)
Platelets: 205 10*3/uL (ref 150–400)
RBC: 4.32 MIL/uL (ref 3.87–5.11)
RDW: 13.1 % (ref 11.5–15.5)
WBC: 12.6 10*3/uL — AB (ref 4.0–10.5)

## 2016-04-25 LAB — ABO/RH: ABO/RH(D): A POS

## 2016-04-25 SURGERY — Surgical Case
Anesthesia: Spinal

## 2016-04-25 MED ORDER — PHENYLEPHRINE 8 MG IN D5W 100 ML (0.08MG/ML) PREMIX OPTIME
INJECTION | INTRAVENOUS | Status: AC
  Filled 2016-04-25: qty 100

## 2016-04-25 MED ORDER — TERBUTALINE SULFATE 1 MG/ML IJ SOLN
INTRAMUSCULAR | Status: AC
  Filled 2016-04-25: qty 1

## 2016-04-25 MED ORDER — FAMOTIDINE IN NACL 20-0.9 MG/50ML-% IV SOLN: 20.0000 mg | Freq: Once | INTRAVENOUS | Status: DC

## 2016-04-25 MED ORDER — ZOLPIDEM TARTRATE 5 MG PO TABS: 5.0000 mg | ORAL_TABLET | Freq: Every evening | ORAL | Status: DC | PRN

## 2016-04-25 MED ORDER — LACTATED RINGERS IV SOLN
INTRAVENOUS | Status: DC | PRN
  Administered 2016-04-25: 16:00:00 via INTRAVENOUS

## 2016-04-25 MED ORDER — NALBUPHINE HCL 10 MG/ML IJ SOLN: 5.0000 mg | Freq: Once | INTRAMUSCULAR | Status: DC | PRN

## 2016-04-25 MED ORDER — DIPHENHYDRAMINE HCL 25 MG PO CAPS: 25.0000 mg | ORAL_CAPSULE | ORAL | Status: DC | PRN

## 2016-04-25 MED ORDER — LACTATED RINGERS IV SOLN
INTRAVENOUS | Status: DC
  Administered 2016-04-26: 01:00:00 via INTRAVENOUS

## 2016-04-25 MED ORDER — FENTANYL CITRATE (PF) 100 MCG/2ML IJ SOLN
INTRAMUSCULAR | Status: AC
  Filled 2016-04-25: qty 2

## 2016-04-25 MED ORDER — SIMETHICONE 80 MG PO CHEW
80.0000 mg | CHEWABLE_TABLET | ORAL | Status: DC
  Administered 2016-04-26 – 2016-04-27 (×2): 80 mg via ORAL
  Filled 2016-04-25 (×3): qty 1

## 2016-04-25 MED ORDER — FLEET ENEMA 7-19 GM/118ML RE ENEM: 1.0000 | ENEMA | Freq: Once | RECTAL | Status: DC

## 2016-04-25 MED ORDER — SIMETHICONE 80 MG PO CHEW
80.0000 mg | CHEWABLE_TABLET | ORAL | Status: DC | PRN
  Filled 2016-04-25: qty 1

## 2016-04-25 MED ORDER — METOCLOPRAMIDE HCL 5 MG/ML IJ SOLN
INTRAMUSCULAR | Status: DC | PRN
  Administered 2016-04-25: 10 mg via INTRAVENOUS

## 2016-04-25 MED ORDER — MORPHINE SULFATE-NACL 0.5-0.9 MG/ML-% IV SOSY
PREFILLED_SYRINGE | INTRAVENOUS | Status: DC | PRN
  Administered 2016-04-25: .2 mg via INTRATHECAL

## 2016-04-25 MED ORDER — LACTATED RINGERS IV SOLN
INTRAVENOUS | Status: DC | PRN
  Administered 2016-04-25 (×2): via INTRAVENOUS

## 2016-04-25 MED ORDER — OXYTOCIN 40 UNITS IN LACTATED RINGERS INFUSION - SIMPLE MED: 2.5000 [IU]/h | INTRAVENOUS | Status: AC

## 2016-04-25 MED ORDER — KETOROLAC TROMETHAMINE 30 MG/ML IJ SOLN
INTRAMUSCULAR | Status: AC
  Filled 2016-04-25: qty 1

## 2016-04-25 MED ORDER — DEXAMETHASONE SODIUM PHOSPHATE 4 MG/ML IJ SOLN
INTRAMUSCULAR | Status: AC
  Filled 2016-04-25: qty 1

## 2016-04-25 MED ORDER — OXYCODONE HCL 5 MG PO TABS
5.0000 mg | ORAL_TABLET | ORAL | Status: DC | PRN
  Administered 2016-04-26: 5 mg via ORAL
  Filled 2016-04-25: qty 1

## 2016-04-25 MED ORDER — PHENYLEPHRINE 40 MCG/ML (10ML) SYRINGE FOR IV PUSH (FOR BLOOD PRESSURE SUPPORT)
PREFILLED_SYRINGE | INTRAVENOUS | Status: AC
  Filled 2016-04-25: qty 10

## 2016-04-25 MED ORDER — LACTATED RINGERS IV SOLN: INTRAVENOUS | Status: DC

## 2016-04-25 MED ORDER — ONDANSETRON HCL 4 MG/2ML IJ SOLN
INTRAMUSCULAR | Status: AC
  Filled 2016-04-25: qty 2

## 2016-04-25 MED ORDER — DIBUCAINE 1 % RE OINT: 1.0000 "application " | TOPICAL_OINTMENT | RECTAL | Status: DC | PRN

## 2016-04-25 MED ORDER — OXYTOCIN 40 UNITS IN LACTATED RINGERS INFUSION - SIMPLE MED
INTRAVENOUS | Status: DC | PRN
  Administered 2016-04-25: 40 [IU] via INTRAVENOUS

## 2016-04-25 MED ORDER — MEPERIDINE HCL 25 MG/ML IJ SOLN: 6.2500 mg | INTRAMUSCULAR | Status: DC | PRN

## 2016-04-25 MED ORDER — SOD CITRATE-CITRIC ACID 500-334 MG/5ML PO SOLN
30.0000 mL | Freq: Once | ORAL | Status: AC
  Administered 2016-04-25: 30 mL via ORAL
  Filled 2016-04-25: qty 15

## 2016-04-25 MED ORDER — TERBUTALINE SULFATE 1 MG/ML IJ SOLN
0.2500 mg | Freq: Once | INTRAMUSCULAR | Status: AC
  Administered 2016-04-25: 0.25 mg via SUBCUTANEOUS

## 2016-04-25 MED ORDER — DEXAMETHASONE SODIUM PHOSPHATE 4 MG/ML IJ SOLN
INTRAMUSCULAR | Status: DC | PRN
  Administered 2016-04-25: 4 mg via INTRAVENOUS

## 2016-04-25 MED ORDER — SENNOSIDES-DOCUSATE SODIUM 8.6-50 MG PO TABS
2.0000 | ORAL_TABLET | ORAL | Status: DC
  Administered 2016-04-26 – 2016-04-27 (×3): 2 via ORAL
  Filled 2016-04-25 (×3): qty 2

## 2016-04-25 MED ORDER — NALOXONE HCL 2 MG/2ML IJ SOSY: 1.0000 ug/kg/h | PREFILLED_SYRINGE | INTRAVENOUS | Status: DC | PRN

## 2016-04-25 MED ORDER — DIPHENHYDRAMINE HCL 25 MG PO CAPS: 25.0000 mg | ORAL_CAPSULE | Freq: Four times a day (QID) | ORAL | Status: DC | PRN

## 2016-04-25 MED ORDER — PHENYLEPHRINE 8 MG IN D5W 100 ML (0.08MG/ML) PREMIX OPTIME
INJECTION | INTRAVENOUS | Status: AC
Start: 2016-04-25 — End: 2016-04-25
  Filled 2016-04-25: qty 100

## 2016-04-25 MED ORDER — SCOPOLAMINE 1 MG/3DAYS TD PT72
MEDICATED_PATCH | TRANSDERMAL | Status: AC
  Filled 2016-04-25: qty 1

## 2016-04-25 MED ORDER — PROMETHAZINE HCL 25 MG/ML IJ SOLN: 6.2500 mg | INTRAMUSCULAR | Status: DC | PRN

## 2016-04-25 MED ORDER — OXYTOCIN BOLUS FROM INFUSION: 500.0000 mL | Freq: Once | INTRAVENOUS | Status: DC

## 2016-04-25 MED ORDER — NALOXONE HCL 0.4 MG/ML IJ SOLN: 0.4000 mg | INTRAMUSCULAR | Status: DC | PRN

## 2016-04-25 MED ORDER — KETOROLAC TROMETHAMINE 30 MG/ML IJ SOLN
30.0000 mg | Freq: Four times a day (QID) | INTRAMUSCULAR | Status: AC | PRN
  Administered 2016-04-25: 30 mg via INTRAMUSCULAR

## 2016-04-25 MED ORDER — LACTATED RINGERS IV SOLN: 500.0000 mL | INTRAVENOUS | Status: DC | PRN

## 2016-04-25 MED ORDER — TETANUS-DIPHTH-ACELL PERTUSSIS 5-2.5-18.5 LF-MCG/0.5 IM SUSP: 0.5000 mL | Freq: Once | INTRAMUSCULAR | Status: DC

## 2016-04-25 MED ORDER — PHENYLEPHRINE 8 MG IN D5W 100 ML (0.08MG/ML) PREMIX OPTIME
INJECTION | INTRAVENOUS | Status: DC | PRN
  Administered 2016-04-25: 60 ug/min via INTRAVENOUS

## 2016-04-25 MED ORDER — FENTANYL CITRATE (PF) 100 MCG/2ML IJ SOLN
25.0000 ug | INTRAMUSCULAR | Status: DC | PRN
  Administered 2016-04-25 (×2): 25 ug via INTRAVENOUS

## 2016-04-25 MED ORDER — FAMOTIDINE IN NACL 20-0.9 MG/50ML-% IV SOLN
20.0000 mg | Freq: Once | INTRAVENOUS | Status: AC
  Administered 2016-04-25: 20 mg via INTRAVENOUS
  Filled 2016-04-25: qty 50

## 2016-04-25 MED ORDER — BUPIVACAINE HCL (PF) 0.25 % IJ SOLN
INTRAMUSCULAR | Status: AC
  Filled 2016-04-25: qty 40

## 2016-04-25 MED ORDER — DIPHENHYDRAMINE HCL 50 MG/ML IJ SOLN: 12.5000 mg | INTRAMUSCULAR | Status: DC | PRN

## 2016-04-25 MED ORDER — WITCH HAZEL-GLYCERIN EX PADS: 1.0000 "application " | MEDICATED_PAD | CUTANEOUS | Status: DC | PRN

## 2016-04-25 MED ORDER — OXYCODONE HCL 5 MG PO TABS
10.0000 mg | ORAL_TABLET | ORAL | Status: DC | PRN
  Administered 2016-04-26 – 2016-04-28 (×12): 10 mg via ORAL
  Filled 2016-04-25 (×12): qty 2

## 2016-04-25 MED ORDER — LACTATED RINGERS IV BOLUS (SEPSIS): 1000.0000 mL | Freq: Once | INTRAVENOUS | Status: DC

## 2016-04-25 MED ORDER — MENTHOL 3 MG MT LOZG: 1.0000 | LOZENGE | OROMUCOSAL | Status: DC | PRN

## 2016-04-25 MED ORDER — OXYTOCIN 10 UNIT/ML IJ SOLN
INTRAMUSCULAR | Status: AC
Start: 2016-04-25 — End: 2016-04-25
  Filled 2016-04-25: qty 4

## 2016-04-25 MED ORDER — LIDOCAINE HCL (PF) 1 % IJ SOLN: 30.0000 mL | INTRAMUSCULAR | Status: DC | PRN

## 2016-04-25 MED ORDER — MORPHINE SULFATE-NACL 0.5-0.9 MG/ML-% IV SOSY
PREFILLED_SYRINGE | INTRAVENOUS | Status: AC
  Filled 2016-04-25: qty 1

## 2016-04-25 MED ORDER — PHENYLEPHRINE HCL 10 MG/ML IJ SOLN
INTRAMUSCULAR | Status: DC | PRN
Start: 2016-04-25 — End: 2016-04-25
  Administered 2016-04-25: 80 ug via INTRAVENOUS

## 2016-04-25 MED ORDER — CEFAZOLIN SODIUM-DEXTROSE 2-4 GM/100ML-% IV SOLN
2.0000 g | INTRAVENOUS | Status: AC
  Administered 2016-04-25: 2 g via INTRAVENOUS
  Filled 2016-04-25: qty 100

## 2016-04-25 MED ORDER — ONDANSETRON HCL 4 MG/2ML IJ SOLN: 4.0000 mg | Freq: Three times a day (TID) | INTRAMUSCULAR | Status: DC | PRN

## 2016-04-25 MED ORDER — OXYTOCIN 40 UNITS IN LACTATED RINGERS INFUSION - SIMPLE MED: 2.5000 [IU]/h | INTRAVENOUS | Status: DC

## 2016-04-25 MED ORDER — SOD CITRATE-CITRIC ACID 500-334 MG/5ML PO SOLN: 30.0000 mL | ORAL | Status: DC | PRN

## 2016-04-25 MED ORDER — SODIUM CHLORIDE 0.9% FLUSH: 3.0000 mL | INTRAVENOUS | Status: DC | PRN

## 2016-04-25 MED ORDER — PRENATAL MULTIVITAMIN CH
1.0000 | ORAL_TABLET | Freq: Every day | ORAL | Status: DC
  Administered 2016-04-26 – 2016-04-27 (×2): 1 via ORAL
  Filled 2016-04-25 (×2): qty 1

## 2016-04-25 MED ORDER — IBUPROFEN 600 MG PO TABS
600.0000 mg | ORAL_TABLET | Freq: Four times a day (QID) | ORAL | Status: DC
Start: 2016-04-26 — End: 2016-04-28
  Administered 2016-04-26 – 2016-04-28 (×9): 600 mg via ORAL
  Filled 2016-04-25 (×10): qty 1

## 2016-04-25 MED ORDER — NALBUPHINE HCL 10 MG/ML IJ SOLN: 5.0000 mg | INTRAMUSCULAR | Status: DC | PRN

## 2016-04-25 MED ORDER — FENTANYL CITRATE (PF) 100 MCG/2ML IJ SOLN
INTRAMUSCULAR | Status: DC | PRN
  Administered 2016-04-25: 10 ug via INTRATHECAL

## 2016-04-25 MED ORDER — ONDANSETRON HCL 4 MG/2ML IJ SOLN
INTRAMUSCULAR | Status: DC | PRN
  Administered 2016-04-25: 4 mg via INTRAVENOUS

## 2016-04-25 MED ORDER — COCONUT OIL OIL: 1.0000 "application " | TOPICAL_OIL | Status: DC | PRN

## 2016-04-25 MED ORDER — METOCLOPRAMIDE HCL 5 MG/ML IJ SOLN
INTRAMUSCULAR | Status: AC
Start: 2016-04-25 — End: 2016-04-25
  Filled 2016-04-25: qty 2

## 2016-04-25 MED ORDER — ONDANSETRON HCL 4 MG/2ML IJ SOLN
4.0000 mg | Freq: Four times a day (QID) | INTRAMUSCULAR | Status: DC | PRN
Start: 2016-04-25 — End: 2016-04-25

## 2016-04-25 MED ORDER — BUPIVACAINE IN DEXTROSE 0.75-8.25 % IT SOLN
INTRATHECAL | Status: DC | PRN
  Administered 2016-04-25: 1.6 mL via INTRATHECAL

## 2016-04-25 MED ORDER — SCOPOLAMINE 1 MG/3DAYS TD PT72
MEDICATED_PATCH | TRANSDERMAL | Status: DC | PRN
  Administered 2016-04-25: 1 via TRANSDERMAL

## 2016-04-25 MED ORDER — MORPHINE SULFATE (PF) 0.5 MG/ML IJ SOLN: INTRAMUSCULAR | Status: DC | PRN

## 2016-04-25 MED ORDER — KETOROLAC TROMETHAMINE 30 MG/ML IJ SOLN: 30.0000 mg | Freq: Four times a day (QID) | INTRAMUSCULAR | Status: AC | PRN

## 2016-04-25 MED ORDER — SIMETHICONE 80 MG PO CHEW
80.0000 mg | CHEWABLE_TABLET | Freq: Three times a day (TID) | ORAL | Status: DC
  Administered 2016-04-26 – 2016-04-28 (×4): 80 mg via ORAL
  Filled 2016-04-25 (×6): qty 1

## 2016-04-25 MED ORDER — SODIUM CHLORIDE 0.9 % IR SOLN
Status: DC | PRN
  Administered 2016-04-25: 1000 mL

## 2016-04-25 SURGICAL SUPPLY — 32 items
BENZOIN TINCTURE PRP APPL 2/3 (GAUZE/BANDAGES/DRESSINGS) ×3 IMPLANT
CHLORAPREP W/TINT 26ML (MISCELLANEOUS) ×3 IMPLANT
CLAMP CORD UMBIL (MISCELLANEOUS) IMPLANT
CLOSURE WOUND 1/2 X4 (GAUZE/BANDAGES/DRESSINGS) ×1
CLOTH BEACON ORANGE TIMEOUT ST (SAFETY) ×3 IMPLANT
DRSG OPSITE POSTOP 4X10 (GAUZE/BANDAGES/DRESSINGS) ×3 IMPLANT
ELECT REM PT RETURN 9FT ADLT (ELECTROSURGICAL) ×3
ELECTRODE REM PT RTRN 9FT ADLT (ELECTROSURGICAL) ×1 IMPLANT
EXTRACTOR VACUUM M CUP 4 TUBE (SUCTIONS) IMPLANT
EXTRACTOR VACUUM M CUP 4' TUBE (SUCTIONS)
GLOVE BIOGEL PI IND STRL 7.0 (GLOVE) ×2 IMPLANT
GLOVE BIOGEL PI INDICATOR 7.0 (GLOVE) ×4
GLOVE ECLIPSE 7.0 STRL STRAW (GLOVE) ×6 IMPLANT
GOWN STRL REUS W/TWL LRG LVL3 (GOWN DISPOSABLE) ×6 IMPLANT
KIT ABG SYR 3ML LUER SLIP (SYRINGE) IMPLANT
NEEDLE HYPO 22GX1.5 SAFETY (NEEDLE) ×3 IMPLANT
NEEDLE HYPO 25X5/8 SAFETYGLIDE (NEEDLE) IMPLANT
NS IRRIG 1000ML POUR BTL (IV SOLUTION) ×3 IMPLANT
PACK C SECTION WH (CUSTOM PROCEDURE TRAY) ×3 IMPLANT
PAD ABD 8X7 1/2 STERILE (GAUZE/BANDAGES/DRESSINGS) ×3 IMPLANT
PAD OB MATERNITY 4.3X12.25 (PERSONAL CARE ITEMS) ×3 IMPLANT
PENCIL SMOKE EVAC W/HOLSTER (ELECTROSURGICAL) ×3 IMPLANT
RTRCTR C-SECT PINK 25CM LRG (MISCELLANEOUS) ×3 IMPLANT
SPONGE GAUZE 4X4 12PLY (GAUZE/BANDAGES/DRESSINGS) ×6 IMPLANT
STRIP CLOSURE SKIN 1/2X4 (GAUZE/BANDAGES/DRESSINGS) ×2 IMPLANT
SUT VIC AB 0 CTX 36 (SUTURE) ×8
SUT VIC AB 0 CTX36XBRD ANBCTRL (SUTURE) ×4 IMPLANT
SUT VIC AB 2-0 CT1 (SUTURE) ×6 IMPLANT
SUT VIC AB 4-0 KS 27 (SUTURE) ×3 IMPLANT
SYR 30ML LL (SYRINGE) ×3 IMPLANT
TOWEL OR 17X24 6PK STRL BLUE (TOWEL DISPOSABLE) ×3 IMPLANT
TRAY FOLEY CATH SILVER 14FR (SET/KITS/TRAYS/PACK) ×3 IMPLANT

## 2016-04-25 NOTE — Anesthesia Procedure Notes (Signed)
Spinal  Patient location during procedure: OR Staffing Anesthesiologist: TURK, STEPHEN EDWARD Performed: anesthesiologist  Preanesthetic Checklist Completed: patient identified, surgical consent, pre-op evaluation, timeout performed, IV checked, risks and benefits discussed and monitors and equipment checked Spinal Block Patient position: sitting Prep: site prepped and draped and DuraPrep Patient monitoring: continuous pulse ox and blood pressure Approach: midline Location: L3-4 Injection technique: single-shot Needle Needle type: Pencan  Needle gauge: 24 G Needle length: 9 cm Assessment Sensory level: T4 Additional Notes Functioning IV was confirmed and monitors were applied. Sterile prep and drape, including hand hygiene, mask and sterile gloves were used. The patient was positioned and the spine was prepped. The skin was anesthetized with lidocaine.  Free flow of clear CSF was obtained prior to injecting local anesthetic into the CSF.  The spinal needle aspirated freely following injection.  The needle was carefully withdrawn.  The patient tolerated the procedure well. Consent was obtained prior to procedure with all questions answered and concerns addressed. Risks including but not limited to bleeding, infection, nerve damage, paralysis, failed block, inadequate analgesia, allergic reaction, high spinal, itching and headache were discussed and the patient wished to proceed.   Stephen Turk, MD     

## 2016-04-25 NOTE — MAU Note (Addendum)
Patient presents with back pain and lost mucus plug since yesterday. Denies vaginal bleeding. Fetus is breech scheduled C/S 05/05/16. PNC Dr. Despina HiddenEure.

## 2016-04-25 NOTE — Telephone Encounter (Signed)
Preadmission screen  

## 2016-04-25 NOTE — H&P (Signed)
Jacqueline ReedyOlivia B Ellison is a 20 y.o. female G1 @ 37.6wks by 6wk scan presenting for eval of intermittent back pain. Denies leaking or bldg. Has had pain since last night. [facility] and has been remarkable for 1) multiple + chlam; neg on 04/18/16 2) GBS neg. She has had a known breech presentation.  OB History    Gravida Para Term Preterm AB Living   1         0   SAB TAB Ectopic Multiple Live Births                 Past Medical History:  Diagnosis Date  . Anxiety   . Arthritis   . Chlamydia infection    Past Surgical History:  Procedure Laterality Date  . HERNIA REPAIR     Family History: family history includes Aneurysm in her maternal grandfather; Diabetes in her maternal grandmother and mother; Heart disease in her paternal grandfather; Hypertension in her father, maternal grandfather, maternal grandmother, mother, paternal grandfather, paternal grandmother, and sister. Social History:  reports that she has been smoking Cigarettes.  She has been smoking about 0.25 packs per day. She has never used smokeless tobacco. She reports that she does not drink alcohol or use drugs.     Maternal Diabetes: No Genetic Screening: Normal Maternal Ultrasounds/Referrals: Normal Fetal Ultrasounds or other Referrals:  None Maternal Substance Abuse:  Yes:  Type: Marijuana Significant Maternal Medications:  None Significant Maternal Lab Results:  Lab values include: Group B Strep negative, Other: multiple +CT results in preg; neg on 04/18/16 Other Comments:  None  ROS History Dilation: 8 (bulging membranes) Effacement (%): 80 Exam by:: L. Paschal, RN Blood pressure 124/82, pulse 94, temperature 98.5 F (36.9 C), temperature source Oral, resp. rate 16, height 5\' 1"  (1.549 m), weight 57.2 kg (126 lb), last menstrual period 07/27/2015. Exam Physical Exam  Constitutional: She is oriented to person, place, and time. She appears well-developed.  HENT:  Head:  Normocephalic.  Neck: Normal range of motion.  Cardiovascular: Normal rate.   Respiratory: Effort normal.  GI:  FHR 150s, +accels, no decels Ctx q 2-3 mins (feels as back pain)  Genitourinary: Vagina normal.  Musculoskeletal: Normal range of motion.  Neurological: She is alert and oriented to person, place, and time.  Skin: Skin is warm and dry.  Psychiatric: She has a normal mood and affect. Her behavior is normal. Thought content normal.    Prenatal labs: ABO, Rh: A/Positive/-- (01/18 1002) Antibody: Negative (06/01 0909) Rubella: 1.00 (01/18 1002) RPR: Non Reactive (06/01 0909)  HBsAg: Negative (01/18 1002)  HIV: Non Reactive (06/01 0909)  GBS: Negative (08/04 1400)   Assessment/Plan: IUP@37 .6wks Active labor Breech presentation  Admit and plan for pLTCS   Cam HaiSHAW, Jaidin Ugarte CNM 04/25/2016, 3:06 PM

## 2016-04-25 NOTE — Transfer of Care (Signed)
Immediate Anesthesia Transfer of Care Note  Patient: Jacqueline ReedyOlivia B Ellison  Procedure(s) Performed: Procedure(s): CESAREAN SECTION (N/A)  Patient Location: PACU  Anesthesia Type:Spinal  Level of Consciousness: awake, alert  and oriented  Airway & Oxygen Therapy: Patient Spontanous Breathing  Post-op Assessment: Report given to RN and Post -op Vital signs reviewed and stable  Post vital signs: Reviewed and stable  Last Vitals:  Vitals:   04/25/16 1410  BP: 124/82  Pulse: 94  Resp: 16  Temp: 36.9 C    Last Pain:  Vitals:   04/25/16 1410  TempSrc: Oral  PainSc: 8       Patients Stated Pain Goal: 2 (04/25/16 1410)  Complications: No apparent anesthesia complications

## 2016-04-25 NOTE — Anesthesia Preprocedure Evaluation (Signed)
Anesthesia Evaluation  Patient identified by MRN, date of birth, ID band Patient awake    Reviewed: Allergy & Precautions, NPO status , Patient's Chart, lab work & pertinent test results  History of Anesthesia Complications Negative for: history of anesthetic complications  Airway Mallampati: II  TM Distance: >3 FB Neck ROM: Full    Dental  (+) Teeth Intact, Dental Advisory Given   Pulmonary neg pulmonary ROS, Current Smoker,    Pulmonary exam normal breath sounds clear to auscultation       Cardiovascular Exercise Tolerance: Good negative cardio ROS Normal cardiovascular exam Rhythm:Regular Rate:Normal     Neuro/Psych PSYCHIATRIC DISORDERS Anxiety Depression negative neurological ROS     GI/Hepatic negative GI ROS, Neg liver ROS,   Endo/Other  negative endocrine ROS  Renal/GU negative Renal ROS     Musculoskeletal negative musculoskeletal ROS (+)   Abdominal   Peds  Hematology negative hematology ROS (+)   Anesthesia Other Findings Day of surgery medications reviewed with the patient.  Reproductive/Obstetrics (+) Pregnancy                             Anesthesia Physical Anesthesia Plan  ASA: II  Anesthesia Plan: Spinal   Post-op Pain Management:    Induction:   Airway Management Planned:   Additional Equipment:   Intra-op Plan:   Post-operative Plan:   Informed Consent: I have reviewed the patients History and Physical, chart, labs and discussed the procedure including the risks, benefits and alternatives for the proposed anesthesia with the patient or authorized representative who has indicated his/her understanding and acceptance.   Dental advisory given  Plan Discussed with: CRNA, Anesthesiologist and Surgeon  Anesthesia Plan Comments: (Discussed risks and benefits of and differences between spinal and general. Discussed risks of spinal including headache, backache,  failure, bleeding, infection, and nerve damage. Patient consents to spinal. Questions answered. Coagulation studies and platelet count acceptable.)        Anesthesia Quick Evaluation

## 2016-04-25 NOTE — Op Note (Signed)
Jacqueline Ellison PROCEDURE DATE: 04/25/2016  PREOPERATIVE DIAGNOSES: Intrauterine pregnancy at 3248w6d weeks gestation; malpresentation: breech  POSTOPERATIVE DIAGNOSES: The same  PROCEDURE: Primary Low Transverse Cesarean Section  SURGEON:  Hermina StaggersMichael L Massiel Stipp   ASSISTANT:  Tinnie Gensanya Pratt  ANESTHESIOLOGIST: Dr. Leilani AbleFranklin Hatchett, MD  INDICATIONS: Jacqueline Ellison is a 20 y.o. G1P1000 at 5648w6d here for cesarean section secondary to the indications listed under preoperative diagnoses; please see preoperative note for further details.  The risks of cesarean section were discussed with the patient including but were not limited to: bleeding which may require transfusion or reoperation; infection which may require antibiotics; injury to bowel, bladder, ureters or other surrounding organs; injury to the fetus; need for additional procedures including hysterectomy in the event of a life-threatening hemorrhage; placental abnormalities wth subsequent pregnancies, incisional problems, thromboembolic phenomenon and other postoperative/anesthesia complications.   The patient concurred with the proposed plan, giving informed written consent for the procedure.    FINDINGS:  Viable female infant in breech presentation.  Apgars pending and pending2  Clear amniotic fluid.  Intact placenta, three vessel cord.  Normal uterus, fallopian tubes and ovaries bilaterally.  ANESTHESIA: Spinal INTRAVENOUS FLUIDS: ESTIMATED BLOOD LOSS: 500 cc URINE OUTPUT:   SPECIMENS: Placenta sent to L&D COMPLICATIONS: None immediate  PROCEDURE IN DETAIL:  The patient preoperatively received intravenous antibiotics and had sequential compression devices applied to her lower extremities.  She was then taken to the operating room where spinal anesthesia was administered and dosed up adequate level. She was then placed in a dorsal supine position with a leftward tilt, and prepped and draped in a sterile manner.  A foley catheter was placed  into her bladder and attached to constant gravity.  After an adequate timeout was performed, a Pfannenstiel skin incision was made with scalpel and carried through to the underlying layer of fascia. The fascia was incised in the midline, and this incision was extended bilaterally using the Mayo scissors.  Kocher clamps were applied to the superior aspect of the fascial incision and the underlying rectus muscles were dissected off bluntly.  A similar process was carried out on the inferior aspect of the fascial incision. The rectus muscles were separated in the midline bluntly and the peritoneum was entered bluntly.Alex self retaining retractor was placed. Bladder flap was created. Attention was turned to the lower uterine segment where a low transverse hysterotomy was made with a scalpel and extended cephalic and cephlead bluntly.  The infant was successfully delivered, the cord was clamped and cut after one minute and the infant was handed over to the awaiting neonatology team. Uterine massage was then administered, and the placenta delivered intact with a three-vessel cord. The uterus was then cleared of clot and debris.  The hysterotomy was closed with 0 Vicryl in a running locked fashion, and an imbricating layer was also placed with 0 Vicryl.   The pelvis was cleared of all clot and debris. Hemostasis was confirmed on all surfaces. The bladder flap was reapproximated with 2/0 Vicryl.  The peritoneum and the muscles were reapproximated using 0 Vicryl. The fascia was then closed using 0 Vicryl in a running fashion.  The subcutaneous layer was irrigated, andthen reapproximated with 2-0  Vicryl interrupted stitches, and 30 ml of 0.5% Marcaine was injected subcutaneously around the incision.  The skin was closed with a 4-0 Vicryl subcuticular stitch.Steri strips and honey comb dressing was applied The patient tolerated the procedure well. Sponge, lap, instrument and needle counts were correct x 3.  She was taken to  the recovery room in stable condition.    Jacqueline Ellison L. Jacqueline Penna, MD Attending Obstetrician & Gynecologist Faculty Practice, [facility]

## 2016-04-25 NOTE — Telephone Encounter (Signed)
Pt c/o severe back pain,up all night, loss mucus plus, +FM, no gush fluids, no vaginal bleeding.  Pt states "really really bad back pain through out the night till this am."  [facility] Pt verbalized understanding.

## 2016-04-25 NOTE — Anesthesia Postprocedure Evaluation (Signed)
Anesthesia Post Note  Patient: Jacqueline Ellison  Procedure(s) Performed: Procedure(s) (LRB): CESAREAN SECTION (N/A)  Patient location during evaluation: PACU Anesthesia Type: Spinal Level of consciousness: awake Pain management: pain level controlled Vital Signs Assessment: post-procedure vital signs reviewed and stable Respiratory status: spontaneous breathing Cardiovascular status: stable Postop Assessment: no headache, no backache and spinal receding Anesthetic complications: no     Last Vitals:  Vitals:   04/25/16 1410 04/25/16 1647  BP: 124/82 128/65  Pulse: 94 99  Resp: 16 15  Temp: 36.9 C 36.6 C    Last Pain:  Vitals:   04/25/16 1647  TempSrc: Oral  PainSc:    Pain Goal: Patients Stated Pain Goal: 2 (04/25/16 1410)               Audrina Marten JR,JOHN Susann GivensFRANKLIN

## 2016-04-26 ENCOUNTER — Encounter (HOSPITAL_COMMUNITY): Payer: Self-pay | Admitting: Certified Registered Nurse Anesthetist

## 2016-04-26 LAB — RPR: RPR: NONREACTIVE

## 2016-04-26 LAB — CBC
HEMATOCRIT: 25.7 % — AB (ref 36.0–46.0)
Hemoglobin: 9.1 g/dL — ABNORMAL LOW (ref 12.0–15.0)
MCH: 29.8 pg (ref 26.0–34.0)
MCHC: 35.4 g/dL (ref 30.0–36.0)
MCV: 84.3 fL (ref 78.0–100.0)
PLATELETS: 155 10*3/uL (ref 150–400)
RBC: 3.05 MIL/uL — AB (ref 3.87–5.11)
RDW: 13.2 % (ref 11.5–15.5)
WBC: 17.1 10*3/uL — AB (ref 4.0–10.5)

## 2016-04-26 NOTE — Clinical Social Work Maternal (Signed)
  CLINICAL SOCIAL WORK MATERNAL/CHILD NOTE  Patient Details  Name: Jacqueline Ellison MRN: [id number] Date of Birth: Feb 12, 1996  Date:  04/26/2016  Clinical Social Worker Initiating Note:   (Zanaria Morell lcsw) Date/ Time Initiated:  04/26/16/1616     Child's Name:    Kylie  Legal Guardian:  Mother   Need for Interpreter:  None   Date of Referral:  04/25/16     Reason for Referral:  Behavioral Health Issues, including SI , Current Substance Use/Substance Use During Pregnancy    Referral Source:  [employer]   Address:   ([address] [place] [phone])  Phone number:  [phone]   Household Members:  Parents   Natural Supports (not living in the home):  Extended Family, Friends   Chiropodist: None   Employment: Unemployed (pt intends to look for a new job soon)   Type of Work:   unknown  Education:  Other (comment) (1.5 yrs of college for nursing)   Financial Resources:  Medicaid   Other Resources:  [facility]   Cultural/Religious Considerations Which May Impact Care:  none noted  Strengths:  Ability to meet basic needs , Home prepared for child , Compliance with medical plan    Risk Factors/Current Problems:  Substance Use  (pt is on probabtion)   Cognitive State:  Alert , Able to Concentrate , Insightful    Mood/Affect:  Flat , Comfortable , Interested    CSW Assessment: CSW met with pt and MGM to discuss consult for maternal THC use, as well as history of anxiety/depression.  Permission granted to speak with pt in the presence of her mother.  Pt admits to smoking marijuana in January, but none since.  Pt adds that she is on "probation" and drug use is against the terms of her probation.  Hospital drug testing policy explained and pt expressed her understanding of such.  Pt states that "she is not worried" and believes all testing will be negative.  CSW further explained that while her baby's UDS was negative, cord testing would be performed  with any positive results being reported to CPS.  Again, pt understanding of need for testing and is cooperative with plan.  Pt identifies a supportive network of family and friends.  FOB is currently jailed and provides limited support.  Pt has all needed supplies and equipment for the baby.  She has had difficulty with breast feeding and plans on bottle feeding, exclusively. She expressed her frustration with the lactation RN and CSW offered emotional support.   Pt admits to having panic attacks in the past, with the most recent one being a year ago. She denies any current psychotropic medications and psychotherapy.  Pt and her OB have discussed PPD already, and pt is agreeable to f/u with MD for any changes in mood/behavior.  Pt/mom appreciative of social work visit.  No other CSW needs identified.  CSW will continue to follow for cord test results and f/u accordingly.   CSW Plan/Description:  Psychosocial Support and Ongoing Assessment of Needs    Roanna Raider, LCSW 04/26/2016, 4:21 PM

## 2016-04-26 NOTE — Progress Notes (Signed)
Subjective: Postpartum Day 1: Cesarean Delivery Patient reports soreness in her abdomen, she does not think it has changed in severity overnight. She also reports light vaginal bleeding. Patient was walking this morning at 0500 to the bathroom, but has not yet voided/stooled. She will contact the nurse before getting up to try and void. She denies nausea or vomiting, has been tolerating PO.  Objective: Vital signs in last 24 hours: Temp:  [97.3 F (36.3 C)-98.7 F (37.1 C)] 98.2 F (36.8 C) (08/12 0453) Pulse Rate:  [51-99] 64 (08/12 0455) Resp:  [10-20] 18 (08/12 0455) BP: (98-139)/(56-87) 98/64 (08/12 0455) SpO2:  [97 %-100 %] 97 % (08/12 0455) Weight:  [57.2 kg (126 lb)] 57.2 kg (126 lb) (08/11 1410)  Physical Exam:  General: alert, cooperative and no distress Uterine Fundus: Palpated firmness on patient's lower left abdomen. Patient was very uncomfortable and exhibited guarding during abdominal exam. Incision: healing well DVT Evaluation: No evidence of DVT seen on physical exam. Negative Homan's sign, no swelling or erythema.   Recent Labs  04/25/16 1510 04/26/16 0541  HGB 12.7 9.1*  HCT 36.6 25.7*    Assessment/Plan: Status post Cesarean section. Doing well postoperatively.  Continue current care. Discussed breast feeding/bottle feeding, patient prefers bottle feeding and was not interested in seeing lactation. Has spoken with her Ob/Gyn in outpatient about contraception but does not remember what they decided on. Will follow up as outpatient in clinic.  Jacqueline MoralesSharon Ellison 04/26/2016, 7:31 AM  Attestation of Attending Supervision of Resident: Evaluation and management procedures were performed by the [facility] under my supervision.  I have seen and examined the patient, reviewed the resident's note and chart, and I agree with the management and plan.  Anibal Hendersonarolyn L Harraway-Smith, M.D. 04/26/2016 6:26 PM

## 2016-04-26 NOTE — Anesthesia Postprocedure Evaluation (Signed)
Anesthesia Post Note  Patient: Jacqueline ReedyOlivia B Ellison  Procedure(s) Performed: Procedure(s) (LRB): CESAREAN SECTION (N/A)  Patient location during evaluation: Mother Baby Anesthesia Type: Spinal Level of consciousness: awake, awake and alert, oriented and patient cooperative Pain management: pain level controlled Vital Signs Assessment: post-procedure vital signs reviewed and stable Respiratory status: spontaneous breathing, nonlabored ventilation and respiratory function stable Cardiovascular status: stable Postop Assessment: no headache, patient able to bend at knees, no backache and no signs of nausea or vomiting Anesthetic complications: no     Last Vitals:  Vitals:   04/26/16 0454 04/26/16 0455  BP: (!) 105/56 98/64  Pulse: (!) 58 64  Resp: 18 18  Temp:      Last Pain:  Vitals:   04/26/16 0454  TempSrc:   PainSc: 5    Pain Goal: Patients Stated Pain Goal: 2 (04/25/16 1410)               Aston Lawhorn L

## 2016-04-26 NOTE — Addendum Note (Signed)
Addendum  created 04/26/16 [id number] by Yolonda KidaAlison L Jacaden Forbush, CRNA   Sign clinical note

## 2016-04-27 MED ORDER — OXYCODONE HCL 10 MG PO TABS: 10.0000 mg | ORAL_TABLET | ORAL | 0 refills | Status: DC | PRN

## 2016-04-28 MED ORDER — IBUPROFEN 600 MG PO TABS: 600.0000 mg | ORAL_TABLET | Freq: Four times a day (QID) | ORAL | 0 refills | Status: DC | PRN

## 2016-04-28 MED ORDER — DOCUSATE SODIUM 100 MG PO CAPS: 100.0000 mg | ORAL_CAPSULE | Freq: Two times a day (BID) | ORAL | 0 refills | Status: DC

## 2016-04-28 MED ORDER — OXYCODONE HCL 5 MG PO TABS: 5.0000 mg | ORAL_TABLET | ORAL | 0 refills | Status: DC | PRN

## 2016-04-28 NOTE — Progress Notes (Signed)
UR chart review completed.  

## 2016-04-28 NOTE — Discharge Summary (Signed)
OB Discharge Summary     Patient Name: Jacqueline Ellison DOB: 1996/04/25 MRN: [id number]  Date of admission: 04/25/2016 Delivering MD: Hermina StaggersERVIN, MICHAEL L   Date of discharge: 04/28/2016  Admitting diagnosis: 38wks, back pain, lost mucus plug Intrauterine pregnancy: 7865w6d     Secondary diagnosis:  Principal Problem:   Breech presentation delivered Active Problems:   Active labor at term   Postpartum care following cesarean delivery  Additional problems: history of substance abuse     Discharge diagnosis: Term Pregnancy Delivered                                                                                                Post partum procedures:none  Augmentation: none  Complications: None  Hospital course:  Onset of Labor With Unplanned C/S  20 y.o. yo G1P1000 at 5865w6d was admitted in Active Labor on 04/25/2016. Patient had a labor course significant for active labor with breech presentation. Membrane Rupture Time/Date: 3:55 PM ,04/25/2016   The patient went for cesarean section due to Malpresentation, and delivered a Viable infant,04/25/2016  Details of operation can be found in separate operative note. Patient had an uncomplicated postpartum course.  She is ambulating,tolerating a regular diet, passing flatus, and urinating well.  Patient is discharged home in stable condition 04/28/16.   Physical exam Vitals:   04/26/16 1803 04/27/16 0600 04/27/16 1742 04/28/16 0553  BP: 114/62 (!) 117/51 (!) 117/58 127/71  Pulse: 64 75 75 91  Resp: 18 18 18 18   Temp: 98.2 F (36.8 C) 98.7 F (37.1 C) 98.1 F (36.7 C) 100.1 F (37.8 C)  TempSrc: Oral  Oral Axillary  SpO2:      Weight:      Height:       General: alert, cooperative and no distress Lochia: appropriate Uterine Fundus: firm Incision: Dressing is clean, dry, and intact DVT Evaluation: No evidence of DVT seen on physical exam. Labs: Lab Results  Component Value Date   WBC 17.1 (H) 04/26/2016   HGB 9.1 (L) 04/26/2016    HCT 25.7 (L) 04/26/2016   MCV 84.3 04/26/2016   PLT 155 04/26/2016   CMP Latest Ref Rng & Units 02/19/2016  Glucose 65 - 99 mg/dL 95  BUN 6 - 20 mg/dL 5(L)  Creatinine 4.090.44 - 1.00 mg/dL 8.110.60  Sodium 914135 - 782145 mmol/L 134(L)  Potassium 3.5 - 5.1 mmol/L 3.9  Chloride 101 - 111 mmol/L 103  CO2 22 - 32 mmol/L 23  Calcium 8.9 - 10.3 mg/dL 8.2(L)  Total Protein 6.5 - 8.1 g/dL 6.4(L)  Total Bilirubin 0.3 - 1.2 mg/dL 9.5(A0.2(L)  Alkaline Phos 38 - 126 U/L 77  AST 15 - 41 U/L 19  ALT 14 - 54 U/L 17    Discharge instruction: per After Visit Summary and "Baby and Me Booklet".  After visit meds:    Medication List    STOP taking these medications   BENADRYL PO   Doxylamine-Pyridoxine 10-10 MG Tbec Commonly known as:  DICLEGIS   promethazine 12.5 MG tablet Commonly known as:  PHENERGAN     TAKE these medications  docusate sodium 100 MG capsule Commonly known as:  COLACE Take 1 capsule (100 mg total) by mouth 2 (two) times daily.   ibuprofen 600 MG tablet Commonly known as:  ADVIL,MOTRIN Take 1 tablet (600 mg total) by mouth every 6 (six) hours as needed for mild pain or moderate pain.   multivitamin-prenatal 27-0.8 MG Tabs tablet Take 1 tablet by mouth daily at 12 noon.   omeprazole 20 MG capsule Commonly known as:  PRILOSEC Take 1 capsule (20 mg total) by mouth daily.   Oxycodone HCl 10 MG Tabs Take 1 tablet (10 mg total) by mouth every 4 (four) hours as needed for moderate pain or severe pain.   oxyCODONE 5 MG immediate release tablet Commonly known as:  ROXICODONE Take 1 tablet (5 mg total) by mouth every 4 (four) hours as needed for severe pain.       Diet: routine diet  Activity: Advance as tolerated. Pelvic rest for 6 weeks.   Outpatient follow up:6 weeks Follow up Appt:Future Appointments Date Time Provider Department Center  05/02/2016 10:15 AM Lazaro ArmsLuther H Eure, MD [facility]  05/02/2016 11:15 AM [facility] PAT 5 [facility] None   Follow up Visit:No  Follow-up on file.  Postpartum contraception: Depo Provera  Newborn Data: Live born female  Birth Weight: 6 lb 6.1 oz (2895 g) APGAR: 7, 9  Baby Feeding: Bottle Disposition:home with mother, ok per SW   04/28/2016 Loni MuseKate Timberlake, MD  I was present for the exam and agree with above.  [place] Lalanya Rufener, CNM 04/28/2016 10:56 AM

## 2016-05-02 ENCOUNTER — Encounter (HOSPITAL_COMMUNITY): Admission: RE | Admit: 2016-05-02 | Payer: Medicaid Other | Source: Ambulatory Visit

## 2016-05-02 ENCOUNTER — Encounter: Payer: Medicaid Other | Admitting: Obstetrics & Gynecology

## 2016-05-02 ENCOUNTER — Encounter: Payer: Self-pay | Admitting: Obstetrics and Gynecology

## 2016-05-02 ENCOUNTER — Ambulatory Visit (INDEPENDENT_AMBULATORY_CARE_PROVIDER_SITE_OTHER): Payer: Medicaid Other | Admitting: Obstetrics and Gynecology

## 2016-05-02 VITALS — BP 112/70 | HR 79 | Ht 61.0 in | Wt 112.8 lb

## 2016-05-02 DIAGNOSIS — Z5189 Encounter for other specified aftercare: Secondary | ICD-10-CM

## 2016-05-02 DIAGNOSIS — O321XX0 Maternal care for breech presentation, not applicable or unspecified: Secondary | ICD-10-CM

## 2016-05-02 NOTE — Progress Notes (Signed)
   Subjective:  Jacqueline Ellison is a 20 y.o. female now 1 week status post primary low transverse cesarean section. C/s by Dr Alysia PennaErvin in labor    Review of Systems Negative    Diet:   normal   Bowel movements : normal.  The patient is not having any pain.  Objective:  BP 112/70   Pulse 79   Ht 5\' 1"  (1.549 m)   Wt 112 lb 12.8 oz (51.2 kg)   LMP 07/27/2015 (Approximate)   Breastfeeding? No   BMI 21.31 kg/m  General:Well developed, well nourished.  No acute distress. Abdomen: Bowel sounds normal, soft, non-tender. Pelvic Exam: not indicated   Incision(s):   Healing well, no drainage, no erythema, no hernia, no swelling, no dehiscence,     Assessment:  Post-Op 1 weeks s/p primary low transverse cesarean section    Doing well postoperatively.   Plan:  1.Wound care discussed   2. Current medications none at this time 3. Activity restrictions: routine post surgical instructions 4. return to work: not applicable. 5. Follow up in 6 week for post op visit   By signing my name below, I, Sonum Patel, attest that this documentation has been prepared under the direction and in the presence of Tilda BurrowJohn V Jayani Rozman, MD. Electronically Signed: Sonum Patel, Scribe. 05/02/16. 12:57 PM.  I personally performed the services described in this documentation, which was SCRIBED in my presence. The recorded information has been reviewed and considered accurate. It has been edited as necessary during review. Tilda BurrowFERGUSON,Fanny Agan V, MD

## 2016-05-05 ENCOUNTER — Inpatient Hospital Stay (HOSPITAL_COMMUNITY)
Admission: RE | Admit: 2016-05-05 | Payer: Medicaid Other | Source: Ambulatory Visit | Admitting: Obstetrics & Gynecology

## 2016-05-05 ENCOUNTER — Encounter (HOSPITAL_COMMUNITY): Admission: RE | Payer: Self-pay | Source: Ambulatory Visit

## 2016-05-05 SURGERY — Surgical Case
Anesthesia: Regional

## 2016-05-08 ENCOUNTER — Telehealth: Payer: Self-pay | Admitting: Obstetrics & Gynecology

## 2016-05-08 ENCOUNTER — Other Ambulatory Visit: Payer: Self-pay | Admitting: Obstetrics & Gynecology

## 2016-05-08 MED ORDER — IBUPROFEN 600 MG PO TABS: 600.0000 mg | ORAL_TABLET | Freq: Four times a day (QID) | ORAL | 0 refills | Status: DC | PRN

## 2016-05-08 MED ORDER — OXYCODONE HCL 5 MG PO TABS: 5.0000 mg | ORAL_TABLET | ORAL | 0 refills | Status: DC | PRN

## 2016-05-08 NOTE — Telephone Encounter (Signed)
Left message letting pt know Ibuprofen was sent to pharmacy and pt needs to pick up Oxycodone prescription. Can pick up tomorrow after 8:30 am. [facility]

## 2016-05-08 NOTE — Telephone Encounter (Signed)
Pt c/o back pain, c-section on 04/25/2016. Pt requesting refill on Oxycodone and Ibuprofen. Pt states she is completely out of both meds.

## 2016-05-20 ENCOUNTER — Telehealth: Payer: Self-pay | Admitting: *Deleted

## 2016-05-20 ENCOUNTER — Other Ambulatory Visit: Payer: Self-pay | Admitting: Obstetrics & Gynecology

## 2016-05-20 MED ORDER — IBUPROFEN 600 MG PO TABS: 600.0000 mg | ORAL_TABLET | Freq: Four times a day (QID) | ORAL | 0 refills | Status: DC | PRN

## 2016-05-20 MED ORDER — OXYCODONE HCL 5 MG PO TABS: 5.0000 mg | ORAL_TABLET | ORAL | 0 refills | Status: DC | PRN

## 2016-05-20 NOTE — Telephone Encounter (Signed)
Pt c/o back pain since her "emergency c-section" 04/25/2016. Pt requesting refill on Oxycodone and Ibuprofen.  Pt states she has taken all the Oxycodone and Ibuprofen from 05/08/2016.

## 2016-06-04 ENCOUNTER — Encounter: Payer: Self-pay | Admitting: Women's Health

## 2016-06-04 ENCOUNTER — Ambulatory Visit: Payer: Medicaid Other | Admitting: Women's Health

## 2016-06-19 ENCOUNTER — Ambulatory Visit: Payer: Medicaid Other | Admitting: Women's Health

## 2016-06-21 ENCOUNTER — Emergency Department (HOSPITAL_COMMUNITY)
Admission: EM | Admit: 2016-06-21 | Discharge: 2016-06-21 | Disposition: A | Discharge status: Home / Self Care | Payer: Medicaid Other | Attending: Emergency Medicine | Admitting: Emergency Medicine

## 2016-06-21 ENCOUNTER — Encounter (HOSPITAL_COMMUNITY): Payer: Self-pay | Admitting: Emergency Medicine

## 2016-06-21 DIAGNOSIS — H1032 Unspecified acute conjunctivitis, left eye: Secondary | ICD-10-CM | POA: Diagnosis not present

## 2016-06-21 DIAGNOSIS — F17210 Nicotine dependence, cigarettes, uncomplicated: Secondary | ICD-10-CM | POA: Diagnosis not present

## 2016-06-21 DIAGNOSIS — H5712 Ocular pain, left eye: Secondary | ICD-10-CM | POA: Diagnosis present

## 2016-06-21 MED ORDER — ERYTHROMYCIN 5 MG/GM OP OINT
TOPICAL_OINTMENT | Freq: Four times a day (QID) | OPHTHALMIC | Status: DC
  Administered 2016-06-21: 1 via OPHTHALMIC
  Filled 2016-06-21: qty 3.5

## 2016-06-21 MED ORDER — FLUORESCEIN SODIUM 1 MG OP STRP
1.0000 | ORAL_STRIP | Freq: Once | OPHTHALMIC | Status: AC
  Administered 2016-06-21: 1 via OPHTHALMIC
  Filled 2016-06-21: qty 1

## 2016-06-21 MED ORDER — TETRACAINE HCL 0.5 % OP SOLN
2.0000 [drp] | Freq: Once | OPHTHALMIC | Status: DC
  Filled 2016-06-21: qty 4

## 2016-06-21 MED ORDER — ERYTHROMYCIN 5 MG/GM OP OINT: TOPICAL_OINTMENT | OPHTHALMIC | 0 refills | Status: DC

## 2016-06-21 NOTE — Discharge Instructions (Addendum)
Read the information below.  I suspect you may have conjunctivitis. You are being prescribed an antibiotic ointment. Please use as directed.  You can apply cool compresses for relief. You are contagious for the first 48 hours of symptom onset. Practice good hand hygiene. Do not share towels or pillows.  Use the prescribed medication as directed.  Please discuss all new medications with your pharmacist.   If your symptoms persist, follow up with eye doctor, I have provided the contact information above.  You may return to the Emergency Department at any time for worsening condition or any new symptoms that concern you. Return to ED if your symptoms do not improve in the next 24-48 hours or you develop pain with eye movement, changes in vision, difficulty moving your eye.

## 2016-06-21 NOTE — ED Triage Notes (Signed)
Pt reports eye irritation, redness and drainage to her L eye that started today.

## 2016-06-21 NOTE — ED Notes (Signed)
Pt reports she normally wears glasses but does not have them with her.

## 2016-06-22 NOTE — ED Provider Notes (Signed)
[facility] DEPT Provider Note   CSN: [id number] Arrival date & time: 06/21/16  1926     History   Chief Complaint Chief Complaint  Patient presents with  . Eye Pain    HPI Jacqueline Ellison is a 20 y.o. female.  Jacqueline Ellison is a 20 y.o. female with h/o anxiety, arthritis presents to ED with complaint of eye redness and irritation. Patient reports onset of left eye irritation today. Notes pruritis and a gritty sensation in her left eye. She took a nap and woke up with her eyelashes matted, yellow discharge, eyelid swelling, and eye redness. She also notes redness in right eye now.  Endorses slight blurriness that she attributes to her eye wateingy and the discharge. She also notes mild sensitivity to light. Denies pain with EOMs. She tried washing her eye with water with minimal relief, no other treatments tried. No known exposure, although has an infant at home. No trauma to eye. No chemical exposure. Denies fever, URI sxs, abdominal pain, N/V, headache or neck pain. She wears glasses. She does not wear contacts.       Past Medical History:  Diagnosis Date  . Anxiety   . Arthritis   . Chlamydia infection     Patient Active Problem List   Diagnosis Date Noted  . Breech presentation delivered 04/25/2016    Class: Acute  . Active labor at term 04/25/2016  . Postpartum care following cesarean delivery 04/25/2016  . Depression 01/14/2016  . Marijuana use 01/14/2016  . Chlamydia infection affecting pregnancy in first trimester, antepartum 10/22/2015  . Supervision of normal first pregnancy 10/03/2015  . possible history of hypothyroidism as a child   . Arthritis   . Anxiety     Past Surgical History:  Procedure Laterality Date  . CESAREAN SECTION N/A 04/25/2016   Procedure: CESAREAN SECTION;  Surgeon: Reva Boresanya S Pratt, MD;  [facility];  Service: Obstetrics;  Laterality: N/A;  . HERNIA REPAIR      OB History    Gravida Para Term Preterm AB Living     1 1 1     1    SAB TAB Ectopic Multiple Live Births         0         Home Medications    Prior to Admission medications   Medication Sig Start Date End Date Taking? Authorizing Provider  docusate sodium (COLACE) 100 MG capsule Take 1 capsule (100 mg total) by mouth 2 (two) times daily. 04/28/16   Garth BignessKathryn Timberlake, MD  erythromycin ophthalmic ointment Place a 1/2 inch ribbon of ointment into the lower eyelid four times a day for 5 days 06/21/16   Lona KettleAshley Laurel Meyer, PA-C  ibuprofen (ADVIL,MOTRIN) 600 MG tablet Take 1 tablet (600 mg total) by mouth every 6 (six) hours as needed for mild pain or moderate pain. 05/20/16   Lazaro ArmsLuther H Eure, MD  omeprazole (PRILOSEC) 20 MG capsule Take 1 capsule (20 mg total) by mouth daily. 03/13/16 03/13/17  Jacklyn ShellFrances Cresenzo-Dishmon, CNM  oxyCODONE (ROXICODONE) 5 MG immediate release tablet Take 1 tablet (5 mg total) by mouth every 4 (four) hours as needed for severe pain. 05/20/16   Lazaro ArmsLuther H Eure, MD  Prenatal Vit-Fe Fumarate-FA (MULTIVITAMIN-PRENATAL) 27-0.8 MG TABS tablet Take 1 tablet by mouth daily at 12 noon.    Historical Provider, MD    Family History Family History  Problem Relation Age of Onset  . Hypertension Mother   . Diabetes Mother   .  Hypertension Father   . Hypertension Sister   . Hypertension Maternal Grandmother   . Diabetes Maternal Grandmother   . Hypertension Maternal Grandfather   . Aneurysm Maternal Grandfather   . Hypertension Paternal Grandmother   . Hypertension Paternal Grandfather   . Heart disease Paternal Grandfather     Social History Social History  Substance Use Topics  . Smoking status: Current Some Day Smoker    Packs/day: 0.25    Types: Cigarettes  . Smokeless tobacco: Never Used  . Alcohol use No     Allergies   Tylenol [acetaminophen]   Review of Systems Review of Systems  Constitutional: Negative for fever.  HENT: Negative for congestion, rhinorrhea and sinus pressure.   Eyes: Positive for  photophobia, pain ( "irritation, gritty sensation"), discharge, redness, itching and visual disturbance ( "slight blurry from eyes watering").  Gastrointestinal: Negative for abdominal pain, nausea and vomiting.  Skin: Negative for rash.  Neurological: Negative for headaches.     Physical Exam Updated Vital Signs BP 107/70   Pulse 78   Temp 97.8 F (36.6 C) (Oral)   Resp 20   Ht 5\' 1"  (1.549 m)   Wt 48.5 kg   LMP 06/18/2016   SpO2 98%   Breastfeeding? No   BMI 20.22 kg/m   Physical Exam  Constitutional: She appears well-developed and well-nourished. No distress.  HENT:  Head: Normocephalic and atraumatic.  Eyes: EOM are normal. Pupils are equal, round, and reactive to light. Right conjunctiva is injected ( mild). Right conjunctiva has no hemorrhage. Left conjunctiva is injected. Left conjunctiva has no hemorrhage. No scleral icterus.  Slit lamp exam:      The left eye shows no corneal abrasion and no fluorescein uptake.  No proptosis. Lids are soft. No TTP or bony defects of orbital rim. Left conjunctival injection with eyelid redness and mild eyelid swelling; no ciliary flush; cornea clear. Mild right conjunctival injection. PERRL. EOMs intact without pain. On fluorescein stain - no corneal foreign bodies, abrasions, dendritic lesions, or ulcerations.   Neck: Normal range of motion.  Pulmonary/Chest: Effort normal. No respiratory distress.  Lymphadenopathy:    She has no cervical adenopathy.  Neurological: She is alert.  Skin: Skin is warm and dry. She is not diaphoretic.  Psychiatric: She has a normal mood and affect. Her behavior is normal.     ED Treatments / Results  Labs (all labs ordered are listed, but only abnormal results are displayed) Labs Reviewed - No data to display  EKG  EKG Interpretation None       Radiology No results found.  Procedures Procedures (including critical care time)  Medications Ordered in ED Medications  fluorescein  ophthalmic strip 1 strip (1 strip Left Eye Given 06/21/16 2343)     Initial Impression / Assessment and Plan / ED Course  I have reviewed the triage vital signs and the nursing notes.  Pertinent labs & imaging results that were available during my care of the patient were reviewed by me and considered in my medical decision making (see chart for details).  Clinical Course    Patient presents to ED with complaint of left eye redness, discharge, and irritation onset today. Patient is afebrile and non-toxic appearing in NAD. VSS. Patient sitting in room with lights on. V/A 20/40 b/l, 20/70 L, and 20/50 R uncorrected, patient wears glasses that she does not have with her. Left conjunctival injection with mucoid discharge and mild crusting noted. Eyelid redness with mild eyelid swelling. PERRL.  EOMs intact without pain. No ciliary flush. Cornea clear. No corneal abrasions or dendritic staining with fluorescein study; no entrapment or consensual photophobia. Presentation consistent with conjunctivitis  - ?viral vs. ?bacterial. Low suspicion for iritis, corneal abrasions, or HSV. Will treat with ophthalmic ABX ointment. Personal hygiene and frequent handwashing discussed.  Patient advised to followup with ophthalmologist if symptoms persist or worsen in any way including vision change or entrapment.  Return precautions given. Patient verbalizes understanding and is agreeable with discharge.    Final Clinical Impressions(s) / ED Diagnoses   Final diagnoses:  Acute conjunctivitis of left eye, unspecified acute conjunctivitis type    New Prescriptions Discharge Medication List as of 06/21/2016 11:30 PM    START taking these medications   Details  erythromycin ophthalmic ointment Place a 1/2 inch ribbon of ointment into the lower eyelid four times a day for 5 days, Print         Lona Kettle, PA-C 06/22/16 1610    Melene Plan, DO 06/23/16 1714

## 2016-06-24 ENCOUNTER — Ambulatory Visit: Payer: Medicaid Other | Admitting: Adult Health

## 2016-07-02 ENCOUNTER — Encounter: Payer: Self-pay | Admitting: Adult Health

## 2016-07-02 ENCOUNTER — Ambulatory Visit (INDEPENDENT_AMBULATORY_CARE_PROVIDER_SITE_OTHER): Payer: Medicaid Other | Admitting: Adult Health

## 2016-07-02 DIAGNOSIS — Z30011 Encounter for initial prescription of contraceptive pills: Secondary | ICD-10-CM | POA: Insufficient documentation

## 2016-07-02 DIAGNOSIS — O99345 Other mental disorders complicating the puerperium: Secondary | ICD-10-CM

## 2016-07-02 DIAGNOSIS — F53 Postpartum depression: Secondary | ICD-10-CM | POA: Insufficient documentation

## 2016-07-02 MED ORDER — NORETHIN-ETH ESTRAD-FE BIPHAS 1 MG-10 MCG / 10 MCG PO TABS: 1.0000 | ORAL_TABLET | Freq: Every day | ORAL | 11 refills | Status: DC

## 2016-07-02 MED ORDER — ALPRAZOLAM 0.25 MG PO TABS: 0.2500 mg | ORAL_TABLET | Freq: Every day | ORAL | 0 refills | Status: DC | PRN

## 2016-07-02 MED ORDER — ESCITALOPRAM OXALATE 10 MG PO TABS: 10.0000 mg | ORAL_TABLET | Freq: Every day | ORAL | 6 refills | Status: DC

## 2016-07-02 NOTE — Progress Notes (Signed)
Patient ID: [name], female   DOB: 10/31/1995, 20 y.o.   MRN: [id number] HPI:  Jacqueline Ellison is a 20 year old white female,sinlge, boy friend in jail, in for postpartum visit.She delivered 04/25/16 by C section at 36+5 weeks, she was breech.She is teary, says she is not sleeping well and has no help with baby.She went to PCP which is at [facility] in [place] but only has pregnancy medicaid at present and was not seen, she says she has been on xanax since she was 16 and  takes 1, 0.25 mg every day, but stopped during pregnancy, but needs them again.  Delivery Date:04/25/16  Method of Delivery: C section, baby girl 6 lbs 6 ozs  Sexual Activity since delivery: No   Method of Feeding: Bottle  Number of weeks bleeding post delivery: 3 weeks  Review of Systems: Patient denies any headaches, hearing loss, fatigue, blurred vision, shortness of breath, chest pain, abdominal pain, problems with bowel movements, urination, or intercourse. No joint pain or mood swings.Has some back pain at times. Reviewed past medical,surgical, social and family history. Reviewed medications and allergies.   Depression Score: 15  BP 98/62 (BP Location: Left Arm, Patient Position: Sitting, Cuff Size: Normal)   Pulse 98   Ht 5\' 1"  (1.549 m)   Wt 110 lb 8 oz (50.1 kg)   LMP 06/18/2016 (Exact Date)   Breastfeeding? No   BMI 20.88 kg/m UPT negative. Skin warm and dry. Neck: mid line trachea, normal thyroid, good ROM, no lymphadenopathy noted. Lungs: clear to ausculation bilaterally. Cardiovascular: regular rate and rhythm. Pelvic Exam:   External genitalia is normal in appearance, no lesions.  The vagina has good color, moisture and rugae, no lesions.Urethra has no masses or tenderness noted. The cervix is smooth.  Uterus is felt to be normal size, shape, and contour, well involuted.  No adnexal masses or tenderness noted.Bladder is non tender, no masses felt.Abdomen is soft and non tender, well healed C section  scar. Discussed with her she needs meds everyday not xanax that those are for short periods of time.She has never had counseling, will give her rx for lexapro, and only 20 xanax and will refer to [facility] and she agrees.She wants birth control was interested in depo, but wants the pill so can start now.She denies any suicidal ideations.Take time for self.She is aware I will not fill more xanax.   Impression:  Status post delivery, post partum check, depression screening,postpartum depression, contraceptive management.   Plan:   Meds ordered this encounter  Medications  . DISCONTD: ALPRAZolam (XANAX) 0.25 MG tablet    Sig: Take 0.25 mg by mouth daily as needed.    Refill:  0  . Pediatric Multiple Vit-C-FA (FLINSTONES GUMMIES OMEGA-3 DHA PO)    Sig: Take by mouth. Takes 2 daily  . Norethindrone-Ethinyl Estradiol-Fe Biphas (LO LOESTRIN FE) 1 MG-10 MCG / 10 MCG tablet    Sig: Take 1 tablet by mouth daily. Take 1 daily by mouth    Dispense:  1 Package    Refill:  11    BIN [id number], PCN [id number], GRP [id number]    Order Specific Question:   Supervising Provider    Answer:   Duane Lope H [[id number]]  . escitalopram (LEXAPRO) 10 MG tablet    Sig: Take 1 tablet (10 mg total) by mouth daily.    Dispense:  30 tablet    Refill:  6    Order Specific Question:   Supervising Provider  Answer:   EURE, LUTHER H [[id number]]  . ALPRAZolam (XANAX) 0.25 MG tablet    Sig: Take 1 tablet (0.25 mg total) by mouth daily as needed.    Dispense:  20 tablet    Refill:  0    Order Specific Question:   Supervising Provider    Answer:   Lazaro ArmsEURE, LUTHER H [[id number]]  Use condoms Start lo loestrin today Return in 4 weeks  Referral sent to [facility]  Review handout on postpartum depression  Follow up with [facility]

## 2016-07-02 NOTE — Patient Instructions (Signed)
Postpartum Depression and Baby Blues The postpartum period begins right after the birth of a baby. During this time, there is often a great amount of joy and excitement. It is also a time of many changes in the life of the parents. Regardless of how many times a mother gives birth, each child brings new challenges and dynamics to the family. It is not unusual to have feelings of excitement along with confusing shifts in moods, emotions, and thoughts. All mothers are at risk of developing postpartum depression or the "baby blues." These mood changes can occur right after giving birth, or they may occur many months after giving birth. The baby blues or postpartum depression can be mild or severe. Additionally, postpartum depression can go away rather quickly, or it can be a long-term condition.  CAUSES Raised hormone levels and the rapid drop in those levels are thought to be a main cause of postpartum depression and the baby blues. A number of hormones change during and after pregnancy. Estrogen and progesterone usually decrease right after the delivery of your baby. The levels of thyroid hormone and various cortisol steroids also rapidly drop. Other factors that play a role in these mood changes include major life events and genetics.  RISK FACTORS If you have any of the following risks for the baby blues or postpartum depression, know what symptoms to watch out for during the postpartum period. Risk factors that may increase the likelihood of getting the baby blues or postpartum depression include:  Having a personal or family history of depression.   Having depression while being pregnant.   Having premenstrual mood issues or mood issues related to oral contraceptives.  Having a lot of life stress.   Having marital conflict.   Lacking a social support network.   Having a baby with special needs.   Having health problems, such as diabetes.  SIGNS AND SYMPTOMS Symptoms of baby blues  include:  Brief changes in mood, such as going from extreme happiness to sadness.  Decreased concentration.   Difficulty sleeping.   Crying spells, tearfulness.   Irritability.   Anxiety.  Symptoms of postpartum depression typically begin within the first month after giving birth. These symptoms include:  Difficulty sleeping or excessive sleepiness.   Marked weight loss.   Agitation.   Feelings of worthlessness.   Lack of interest in activity or food.  Postpartum psychosis is a very serious condition and can be dangerous. Fortunately, it is rare. Displaying any of the following symptoms is cause for immediate medical attention. Symptoms of postpartum psychosis include:   Hallucinations and delusions.   Bizarre or disorganized behavior.   Confusion or disorientation.  DIAGNOSIS  A diagnosis is made by an evaluation of your symptoms. There are no medical or lab tests that lead to a diagnosis, but there are various questionnaires that a health care provider may use to identify those with the baby blues, postpartum depression, or psychosis. Often, a screening tool called the Edinburgh Postnatal Depression Scale is used to diagnose depression in the postpartum period.  TREATMENT The baby blues usually goes away on its own in 1-2 weeks. Social support is often all that is needed. You will be encouraged to get adequate sleep and rest. Occasionally, you may be given medicines to help you sleep.  Postpartum depression requires treatment because it can last several months or longer if it is not treated. Treatment may include individual or group therapy, medicine, or both to address any social, physiological, and psychological   factors that may play a role in the depression. Regular exercise, a healthy diet, rest, and social support may also be strongly recommended.  Postpartum psychosis is more serious and needs treatment right away. Hospitalization is often needed. HOME CARE  INSTRUCTIONS  Get as much rest as you can. Nap when the baby sleeps.   Exercise regularly. Some women find yoga and walking to be beneficial.   Eat a balanced and nourishing diet.   Do little things that you enjoy. Have a cup of tea, take a bubble bath, read your favorite magazine, or listen to your favorite music.  Avoid alcohol.   Ask for help with household chores, cooking, grocery shopping, or running errands as needed. Do not try to do everything.   Talk to people close to you about how you are feeling. Get support from your partner, family members, friends, or other new moms.  Try to stay positive in how you think. Think about the things you are grateful for.   Do not spend a lot of time alone.   Only take over-the-counter or prescription medicine as directed by your health care provider.  Keep all your postpartum appointments.   Let your health care provider know if you have any concerns.  SEEK MEDICAL CARE IF: You are having a reaction to or problems with your medicine. SEEK IMMEDIATE MEDICAL CARE IF:  You have suicidal feelings.   You think you may harm the baby or someone else. MAKE SURE YOU:  Understand these instructions.  Will watch your condition.  Will get help right away if you are not doing well or get worse.   This information is not intended to replace advice given to you by your health care provider. Make sure you discuss any questions you have with your health care provider.   Document Released: 06/05/2004 Document Revised: 09/06/2013 Document Reviewed: 06/13/2013 Elsevier Interactive Patient Education 2016 [employer] Start lexapro today Start lo loestrin today If has sex use condoms Referred to [facility] Follow [facility] in [facility] weeks [facility]

## 2016-07-30 ENCOUNTER — Ambulatory Visit: Payer: Medicaid Other | Admitting: Adult Health

## 2016-10-02 ENCOUNTER — Ambulatory Visit: Payer: Medicaid Other | Admitting: Adult Health

## 2016-10-08 ENCOUNTER — Ambulatory Visit (INDEPENDENT_AMBULATORY_CARE_PROVIDER_SITE_OTHER): Payer: Medicaid Other | Admitting: Adult Health

## 2016-10-08 ENCOUNTER — Encounter (INDEPENDENT_AMBULATORY_CARE_PROVIDER_SITE_OTHER): Payer: Self-pay

## 2016-10-08 ENCOUNTER — Encounter: Payer: Self-pay | Admitting: Adult Health

## 2016-10-08 VITALS — BP 111/58 | HR 65 | Ht 61.0 in | Wt 102.5 lb

## 2016-10-08 DIAGNOSIS — Z30013 Encounter for initial prescription of injectable contraceptive: Secondary | ICD-10-CM

## 2016-10-08 DIAGNOSIS — Z113 Encounter for screening for infections with a predominantly sexual mode of transmission: Secondary | ICD-10-CM | POA: Diagnosis not present

## 2016-10-08 DIAGNOSIS — Z3202 Encounter for pregnancy test, result negative: Secondary | ICD-10-CM | POA: Diagnosis not present

## 2016-10-08 DIAGNOSIS — N926 Irregular menstruation, unspecified: Secondary | ICD-10-CM

## 2016-10-08 LAB — POCT URINE PREGNANCY: PREG TEST UR: NEGATIVE

## 2016-10-08 MED ORDER — MEDROXYPROGESTERONE ACETATE 150 MG/ML IM SUSP: 150.0000 mg | INTRAMUSCULAR | 4 refills | Status: DC

## 2016-10-08 MED ORDER — MEDROXYPROGESTERONE ACETATE 10 MG PO TABS: 10.0000 mg | ORAL_TABLET | Freq: Every day | ORAL | 0 refills | Status: DC

## 2016-10-08 NOTE — Patient Instructions (Signed)
Call with period for depo

## 2016-10-08 NOTE — Progress Notes (Signed)
Subjective:     Patient ID: Jacqueline Ellison, female   DOB: 05-29-1996, 21 y.o.   MRN: [id number]  HPI Zollie ScaleOlivia is a 21 year old white female in complaining of no period since October, she took OCs 1 month and stopped.She has sex in December x 1 and he told her he put hole in condom. She wants to get on depo.  Review of Systems +missed period Reviewed past medical,surgical, social and family history. Reviewed medications and allergies.     Objective:   Physical Exam BP (!) 111/58 (BP Location: Left Arm, Patient Position: Sitting, Cuff Size: Normal)   Pulse 65   Ht 5\' 1"  (1.549 m)   Wt 102 lb 8 oz (46.5 kg)   LMP 06/21/2016 (Approximate)   Breastfeeding? No   BMI 19.37 kg/m UPT negative.PHQ 2 score 0. Skin warm and dry.Pelvic: external genitalia is normal in appearance no lesions, vagina:scant white discharge without odor,urethra has no lesions or masses noted, cervix:smooth, uterus: normal size, shape and contour, non tender, no masses felt, adnexa: no masses or tenderness noted. Bladder is non tender and no masses felt.  GC/CHL obtained.    Will rx provera to get withdrawal bleed then start depo.  Assessment:     1. Missed periods   2. Pregnancy examination or test, negative result   3. Screening examination for STD (sexually transmitted disease)   4. Encounter for initial prescription of injectable contraceptive       Plan:     GC/CHL sent Rx provera 10 mg #10 take 1 daily for 10 days Rx Depo provera 150 mg, disp.# 1  for IM injection every 3 months in office, with 4 refills Call with period for depo

## 2016-10-11 ENCOUNTER — Encounter (HOSPITAL_COMMUNITY): Payer: Self-pay | Admitting: Emergency Medicine

## 2016-10-11 ENCOUNTER — Emergency Department (HOSPITAL_COMMUNITY)
Admission: EM | Admit: 2016-10-11 | Discharge: 2016-10-11 | Disposition: A | Discharge status: Home / Self Care | Payer: Medicaid Other | Attending: Emergency Medicine | Admitting: Emergency Medicine

## 2016-10-11 DIAGNOSIS — R509 Fever, unspecified: Secondary | ICD-10-CM | POA: Diagnosis present

## 2016-10-11 DIAGNOSIS — Z79899 Other long term (current) drug therapy: Secondary | ICD-10-CM | POA: Diagnosis not present

## 2016-10-11 DIAGNOSIS — B349 Viral infection, unspecified: Secondary | ICD-10-CM

## 2016-10-11 DIAGNOSIS — F17210 Nicotine dependence, cigarettes, uncomplicated: Secondary | ICD-10-CM | POA: Diagnosis not present

## 2016-10-11 LAB — GC/CHLAMYDIA PROBE AMP
Chlamydia trachomatis, NAA: NEGATIVE
NEISSERIA GONORRHOEAE BY PCR: NEGATIVE

## 2016-10-11 MED ORDER — OSELTAMIVIR PHOSPHATE 75 MG PO CAPS: 75.0000 mg | ORAL_CAPSULE | Freq: Two times a day (BID) | ORAL | 0 refills | Status: DC

## 2016-10-11 MED ORDER — ONDANSETRON 8 MG PO TBDP
8.0000 mg | ORAL_TABLET | Freq: Once | ORAL | Status: AC
  Administered 2016-10-11: 8 mg via ORAL
  Filled 2016-10-11: qty 1

## 2016-10-11 MED ORDER — ONDANSETRON HCL 4 MG PO TABS: 4.0000 mg | ORAL_TABLET | Freq: Three times a day (TID) | ORAL | 0 refills | Status: DC | PRN

## 2016-10-11 NOTE — ED Triage Notes (Signed)
Sick with flu like body aches fever since last pm No flu shot, [facility] [place] Reports fever to 101, but no tylenol nor ibuprofen.

## 2016-10-11 NOTE — Discharge Instructions (Signed)
Take the prescriptions as directed.  Take over the counter tylenol and ibuprofen, as directed on packaging, as needed for discomfort or fever. Increase your fluid intake (ie:  Gatoraide) for the next few days.  Eat a bland diet and advance to your regular diet slowly as you can tolerate it. Call your regular medical doctor Monday to schedule a follow up appointment this week.  Return to the Emergency Department immediately sooner if worsening.

## 2016-10-11 NOTE — ED Provider Notes (Signed)
[facility]Y DEPT Provider Note   CSN: [id number] Arrival date & time: 10/11/16  1601     History   Chief Complaint Chief Complaint  Patient presents with  . Influenza    HPI Jacqueline Ellison is a 21 y.o. female.  HPI  Pt was seen at 1805.  Per pt, c/o gradual onset and persistence of constant generalized body aches, fatigue, fever, and several episodes of N/V since last evening. Others in household with similar symptoms. One family member being tx for influenza. Pt has not had her flu shot this year. Denies sore throat, no rash, no CP/SOB, no abd pain.    Past Medical History:  Diagnosis Date  . Anxiety   . Arthritis   . Chlamydia infection   . Mental disorder    depression    Patient Active Problem List   Diagnosis Date Noted  . 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 Encounter for initial prescription of contraceptive pills 07/02/2016  . Postpartum care and examination 07/02/2016  . Breech presentation delivered 04/25/2016    Class: Acute  . Active labor at term 04/25/2016  . Postpartum care following cesarean delivery 04/25/2016  . Depression 01/14/2016  . Marijuana use 01/14/2016  . Chlamydia infection affecting pregnancy in first trimester, antepartum 10/22/2015  . Supervision of normal first pregnancy 10/03/2015  . possible history of hypothyroidism as a child   . Arthritis   . Anxiety     Past Surgical History:  Procedure Laterality Date  . CESAREAN SECTION N/A 04/25/2016   Procedure: CESAREAN SECTION;  Surgeon: Reva Boresanya S Pratt, MD;  [facility];  Service: Obstetrics;  Laterality: N/A;  . HERNIA REPAIR      OB History    Gravida Para Term Preterm AB Living   1 1 1     1    SAB TAB Ectopic Multiple Live Births         0 1       Home Medications    Prior to Admission medications   Medication Sig Start Date End Date Taking? Authorizing Provider  buprenorphine-naloxone (SUBOXONE) 8-2 MG SUBL SL tablet Place 1 tablet under the tongue  daily.   Yes Historical Provider, MD  medroxyPROGESTERone (DEPO-PROVERA) 150 MG/ML injection Inject 1 mL (150 mg total) into the muscle every 3 (three) months. 10/08/16  Yes Adline PotterJennifer A Griffin, NP  medroxyPROGESTERone (PROVERA) 10 MG tablet Take 1 tablet (10 mg total) by mouth daily. 10/08/16  Yes Adline PotterJennifer A Griffin, NP    Family History Family History  Problem Relation Age of Onset  . Hypertension Mother   . Diabetes Mother   . Hypertension Father   . Hypertension Sister   . Hypertension Maternal Grandmother   . Diabetes Maternal Grandmother   . Hypertension Maternal Grandfather   . Aneurysm Maternal Grandfather   . Hypertension Paternal Grandmother   . Hypertension Paternal Grandfather   . Heart disease Paternal Grandfather     Social History Social History  Substance Use Topics  . Smoking status: Current Every Day Smoker    Packs/day: 1.00    Years: 5.00    Types: Cigarettes  . Smokeless tobacco: Never Used  . Alcohol use No     Allergies   Tylenol [acetaminophen]   Review of Systems Review of Systems ROS: Statement: All systems negative except as marked or noted in the HPI; Constitutional: +generalized body aches, fatigue, fever and chills. ; ; Eyes: Negative for eye pain, redness and discharge. ; ; ENMT: Negative for  ear pain, hoarseness, nasal congestion, sinus pressure and sore throat. ; ; Cardiovascular: Negative for chest pain, palpitations, diaphoresis, dyspnea and peripheral edema. ; ; Respiratory: Negative for cough, wheezing and stridor. ; ; Gastrointestinal: +N/V. Negative for diarrhea, abdominal pain, blood in stool, hematemesis, jaundice and rectal bleeding. . ; ; Genitourinary: Negative for dysuria, flank pain and hematuria. ; ; Musculoskeletal: Negative for back pain and neck pain. Negative for swelling and trauma.; ; Skin: Negative for pruritus, rash, abrasions, blisters, bruising and skin lesion.; ; Neuro: Negative for headache, lightheadedness and neck  stiffness. Negative for weakness, altered level of consciousness, altered mental status, extremity weakness, paresthesias, involuntary movement, seizure and syncope.       Physical Exam Updated Vital Signs BP 120/59 (BP Location: Right Arm)   Pulse 66   Temp 98.4 F (36.9 C) (Oral)   Resp 18   Ht 5\' 1"  (1.549 m)   Wt 102 lb (46.3 kg)   LMP 07/11/2016   SpO2 100%   BMI 19.27 kg/m   Physical Exam 1810: Physical examination:  Nursing notes reviewed; Vital signs and O2 SAT reviewed;  Constitutional: Well developed, Well nourished, Well hydrated, In no acute distress; Head:  Normocephalic, atraumatic; Eyes: EOMI, PERRL, No scleral icterus; ENMT: TM's clear bilat. +edemetous nasal turbinates bilat with clear rhinorrhea. Mouth and pharynx without lesions. No tonsillar exudates. No intra-oral edema. No submandibular or sublingual edema. No hoarse voice, no drooling, no stridor. No pain with manipulation of larynx. No trismus.  Mouth and pharynx normal, Mucous membranes moist; Neck: Supple, Full range of motion, No lymphadenopathy; Cardiovascular: Regular rate and rhythm, No murmur, rub, or gallop; Respiratory: Breath sounds clear & equal bilaterally, No rales, rhonchi, wheezes.  Speaking full sentences with ease, Normal respiratory effort/excursion; Chest: Nontender, Movement normal; Abdomen: Soft, Nontender, Nondistended, Normal bowel sounds; Genitourinary: No CVA tenderness; Extremities: Pulses normal, No tenderness, No edema, No calf edema or asymmetry.; Neuro: AA&Ox3, Major CN grossly intact.  Speech clear. No gross focal motor or sensory deficits in extremities.; Skin: Color normal, Warm, Dry.   ED Treatments / Results  Labs (all labs ordered are listed, but only abnormal results are displayed)   EKG  EKG Interpretation None       Radiology   Procedures Procedures (including critical care time)  Medications Ordered in ED Medications  ondansetron (ZOFRAN-ODT) disintegrating  tablet 8 mg (8 mg Oral Given 10/11/16 1825)     Initial Impression / Assessment and Plan / ED Course  I have reviewed the triage vital signs and the nursing notes.  Pertinent labs & imaging results that were available during my care of the patient were reviewed by me and considered in my medical decision making (see chart for details).  MDM Reviewed: previous chart, nursing note and vitals   1920:  Pt has tol PO well while in the ED without N/V.  No stooling while in the ED.  Abd remains benign, VSS. Feels better and wants to go home now. Dx and testing d/w pt and family.  Questions answered.  Verb understanding, agreeable to d/c home with outpt f/u.    Final Clinical Impressions(s) / ED Diagnoses   Final diagnoses:  None    New Prescriptions New Prescriptions   No medications on file     Samuel Jester, DO 10/14/16 1217

## 2016-12-05 ENCOUNTER — Other Ambulatory Visit (HOSPITAL_COMMUNITY): Payer: Self-pay | Admitting: Family Medicine

## 2016-12-05 DIAGNOSIS — R7989 Other specified abnormal findings of blood chemistry: Secondary | ICD-10-CM

## 2016-12-10 ENCOUNTER — Ambulatory Visit (HOSPITAL_COMMUNITY)
Admission: RE | Admit: 2016-12-10 | Discharge: 2016-12-10 | Disposition: A | Discharge status: Home / Self Care | Payer: Medicaid Other | Source: Ambulatory Visit | Attending: Family Medicine | Admitting: Family Medicine

## 2016-12-10 DIAGNOSIS — R7989 Other specified abnormal findings of blood chemistry: Secondary | ICD-10-CM | POA: Diagnosis not present

## 2017-01-08 ENCOUNTER — Encounter (INDEPENDENT_AMBULATORY_CARE_PROVIDER_SITE_OTHER): Payer: Self-pay | Admitting: Internal Medicine

## 2017-01-08 ENCOUNTER — Encounter (INDEPENDENT_AMBULATORY_CARE_PROVIDER_SITE_OTHER): Payer: Self-pay

## 2017-01-21 ENCOUNTER — Ambulatory Visit (INDEPENDENT_AMBULATORY_CARE_PROVIDER_SITE_OTHER): Payer: Medicaid Other | Admitting: Internal Medicine

## 2017-01-21 ENCOUNTER — Encounter (INDEPENDENT_AMBULATORY_CARE_PROVIDER_SITE_OTHER): Payer: Self-pay | Admitting: Internal Medicine

## 2017-02-12 ENCOUNTER — Encounter (INDEPENDENT_AMBULATORY_CARE_PROVIDER_SITE_OTHER): Payer: Self-pay | Admitting: Internal Medicine

## 2017-02-12 ENCOUNTER — Ambulatory Visit (INDEPENDENT_AMBULATORY_CARE_PROVIDER_SITE_OTHER): Payer: Self-pay | Admitting: Internal Medicine

## 2017-02-12 VITALS — BP 90/70 | HR 60 | Temp 98.3°F | Ht 61.0 in | Wt 101.5 lb

## 2017-02-12 DIAGNOSIS — R748 Abnormal levels of other serum enzymes: Secondary | ICD-10-CM

## 2017-02-12 LAB — HEPATIC FUNCTION PANEL
ALT: 92 U/L — AB (ref 6–29)
AST: 82 U/L — ABNORMAL HIGH (ref 10–30)
Albumin: 4.1 g/dL (ref 3.6–5.1)
Alkaline Phosphatase: 88 U/L (ref 33–115)
Bilirubin, Direct: 0.1 mg/dL (ref <=0.2)
Indirect Bilirubin: 0.3 mg/dL (ref 0.2–1.2)
TOTAL PROTEIN: 6.6 g/dL (ref 6.1–8.1)
Total Bilirubin: 0.4 mg/dL (ref 0.2–1.2)

## 2017-02-12 LAB — CBC WITH DIFFERENTIAL/PLATELET
BASOS ABS: 57 {cells}/uL (ref 0–200)
Basophils Relative: 1 %
EOS ABS: 285 {cells}/uL (ref 15–500)
Eosinophils Relative: 5 %
HCT: 40.3 % (ref 35.0–45.0)
Hemoglobin: 13.8 g/dL (ref 11.7–15.5)
LYMPHS PCT: 49 %
Lymphs Abs: 2793 cells/uL (ref 850–3900)
MCH: 28.8 pg (ref 27.0–33.0)
MCHC: 34.2 g/dL (ref 32.0–36.0)
MCV: 84.1 fL (ref 80.0–100.0)
MPV: 11.9 fL (ref 7.5–12.5)
Monocytes Absolute: 513 cells/uL (ref 200–950)
Monocytes Relative: 9 %
NEUTROS PCT: 36 %
Neutro Abs: 2052 cells/uL (ref 1500–7800)
Platelets: 158 10*3/uL (ref 140–400)
RBC: 4.79 MIL/uL (ref 3.80–5.10)
RDW: 13.4 % (ref 11.0–15.0)
WBC: 5.7 10*3/uL (ref 3.8–10.8)

## 2017-02-12 LAB — HEPATITIS PANEL, ACUTE
HCV AB: REACTIVE — AB
HEP B S AG: NEGATIVE
Hep A IgM: NONREACTIVE
Hep B C IgM: NONREACTIVE

## 2017-02-12 NOTE — Patient Instructions (Signed)
Labs today. OV in 6 months.  

## 2017-02-12 NOTE — Progress Notes (Signed)
   Subjective:    Patient ID: Jacqueline Ellison, female    DOB: 1996/04/01, 21 y.o.   MRN: [id number]  HPI Referred by Dr. Dimas AguasHoward for elevated liver enzymes. 12/30/2016 ALP 97, AST 91, ALT 105.  Liver enyzmes normal 02/19/2016. Hx of IV drugs 1 1/2 yrs ago: pain medications Multiple tattoos. Last tattoo 3 yrs ago.  Unemployed. 129 month old child.  Is on Suboxone and is followed by [facility].   CMP Latest Ref Rng & Units 02/19/2016 05/06/2013 07/16/2012  Glucose 65 - 99 mg/dL 95 84 76  BUN 6 - 20 mg/dL 5(L) 11 5(L)  Creatinine 0.44 - 1.00 mg/dL 6.290.60 5.280.75 4.130.90  Sodium 135 - 145 mmol/L 134(L) 141 141  Potassium 3.5 - 5.1 mmol/L 3.9 4.0 4.1  Chloride 101 - 111 mmol/L 103 103 102  CO2 22 - 32 mmol/L 23 28 -  Calcium 8.9 - 10.3 mg/dL 8.2(L) 9.6 -  Total Protein 6.5 - 8.1 g/dL 6.4(L) 8.0 -  Total Bilirubin 0.3 - 1.2 mg/dL 2.4(M0.2(L) 0.4 -  Alkaline Phos 38 - 126 U/L 77 92 -  AST 15 - 41 U/L 19 25 -  ALT 14 - 54 U/L 17 23 -     12/10/2016 US abdomen: Unremarkable US. CBD 3mm.  09/2015 Hep B surface antigen: negative.    Review of Systems Past Medical History:  Diagnosis Date  . Anxiety   . Arthritis   . Chlamydia infection   . Mental disorder    depression    Past Surgical History:  Procedure Laterality Date  . CESAREAN SECTION N/A 04/25/2016   Procedure: CESAREAN SECTION;  Surgeon: Reva Boresanya S Pratt, MD;  [facility];  Service: Obstetrics;  Laterality: N/A;  . HERNIA REPAIR      Allergies  Allergen Reactions  . Tylenol [Acetaminophen] Nausea And Vomiting    Current Outpatient Prescriptions on File Prior to Visit  Medication Sig Dispense Refill  . buprenorphine-naloxone (SUBOXONE) 8-2 MG SUBL SL tablet Place 2 tablets under the tongue daily.      No current facility-administered medications on file prior to visit.         Objective:   Physical Exam Blood pressure 90/70, pulse 60, temperature 98.3 F (36.8 C), height 5\' 1"  (1.549 m), weight 101 lb 8 oz (46  kg), not currently breastfeeding. Alert and oriented. Skin warm and dry. Oral mucosa is moist.   . Sclera anicteric, conjunctivae is pink. Thyroid not enlarged. No cervical lymphadenopathy. Lungs clear. Heart regular rate and rhythm.  Abdomen is soft. Bowel sounds are positive. No hepatomegaly. No abdominal masses felt. No tenderness.  No edema to lower extremities.           Assessment & Plan:  Elevated liver enzymes. Am going to get an Acute Hepatitis panel, Hepatic panel and CBC OV in 6 months.

## 2017-02-13 ENCOUNTER — Encounter (INDEPENDENT_AMBULATORY_CARE_PROVIDER_SITE_OTHER): Payer: Self-pay

## 2017-02-14 LAB — HEPATITIS C RNA QUANTITATIVE
HCV Quantitative Log: 6.05 Log IU/mL — ABNORMAL HIGH
HCV Quantitative: 1120000 IU/mL — ABNORMAL HIGH

## 2017-02-16 ENCOUNTER — Encounter (INDEPENDENT_AMBULATORY_CARE_PROVIDER_SITE_OTHER): Payer: Self-pay

## 2017-02-16 ENCOUNTER — Telehealth (INDEPENDENT_AMBULATORY_CARE_PROVIDER_SITE_OTHER): Payer: Self-pay | Admitting: Internal Medicine

## 2017-02-16 DIAGNOSIS — B1710 Acute hepatitis C without hepatic coma: Secondary | ICD-10-CM

## 2017-02-16 NOTE — Telephone Encounter (Signed)
I have talked with patient  

## 2017-02-16 NOTE — Telephone Encounter (Signed)
Labs for Hepatitis c.

## 2017-02-18 LAB — CBC WITH DIFFERENTIAL/PLATELET
Basophils Absolute: 0 cells/uL (ref 0–200)
Basophils Relative: 0 %
Eosinophils Absolute: 147 cells/uL (ref 15–500)
Eosinophils Relative: 3 %
HEMATOCRIT: 40.4 % (ref 35.0–45.0)
HEMOGLOBIN: 13.8 g/dL (ref 11.7–15.5)
LYMPHS ABS: 2401 {cells}/uL (ref 850–3900)
Lymphocytes Relative: 49 %
MCH: 28.8 pg (ref 27.0–33.0)
MCHC: 34.2 g/dL (ref 32.0–36.0)
MCV: 84.3 fL (ref 80.0–100.0)
MONO ABS: 343 {cells}/uL (ref 200–950)
MPV: 10.8 fL (ref 7.5–12.5)
Monocytes Relative: 7 %
NEUTROS ABS: 2009 {cells}/uL (ref 1500–7800)
Neutrophils Relative %: 41 %
Platelets: 192 10*3/uL (ref 140–400)
RBC: 4.79 MIL/uL (ref 3.80–5.10)
RDW: 13.1 % (ref 11.0–15.0)
WBC: 4.9 10*3/uL (ref 3.8–10.8)

## 2017-02-18 LAB — HEPATIC FUNCTION PANEL
ALK PHOS: 94 U/L (ref 33–115)
ALT: 125 U/L — AB (ref 6–29)
AST: 106 U/L — AB (ref 10–30)
Albumin: 4.3 g/dL (ref 3.6–5.1)
BILIRUBIN DIRECT: 0.2 mg/dL (ref <=0.2)
BILIRUBIN INDIRECT: 0.4 mg/dL (ref 0.2–1.2)
BILIRUBIN TOTAL: 0.6 mg/dL (ref 0.2–1.2)
Total Protein: 7.1 g/dL (ref 6.1–8.1)

## 2017-02-18 LAB — HEPATITIS B SURFACE ANTIGEN: HEP B S AG: NEGATIVE

## 2017-02-19 LAB — PROTIME-INR
INR: 1.1
Prothrombin Time: 11.3 s (ref 9.0–11.5)

## 2017-02-19 LAB — AFP TUMOR MARKER: AFP-Tumor Marker: 2 ng/mL (ref <6.1)

## 2017-02-20 LAB — DRUG ABUSE PANEL 10-50, U
AMPHETAMINES (1000 NG/ML SCRN): NEGATIVE
BARBITURATES: NEGATIVE
BENZODIAZEPINES: NEGATIVE
COCAINE METABOLITES: NEGATIVE
MARIJUANA MET (50 ng/mL SCRN): NEGATIVE
METHADONE: NEGATIVE
METHAQUALONE: NEGATIVE
OPIATES: NEGATIVE
PHENCYCLIDINE: NEGATIVE
PROPOXYPHENE: NEGATIVE

## 2017-02-22 LAB — HEPATITIS C GENOTYPE

## 2017-02-23 ENCOUNTER — Telehealth (INDEPENDENT_AMBULATORY_CARE_PROVIDER_SITE_OTHER): Payer: Self-pay | Admitting: Internal Medicine

## 2017-02-23 ENCOUNTER — Ambulatory Visit (HOSPITAL_COMMUNITY)
Admission: RE | Admit: 2017-02-23 | Discharge: 2017-02-23 | Disposition: A | Discharge status: Home / Self Care | Payer: Medicaid Other | Source: Ambulatory Visit | Attending: Internal Medicine | Admitting: Internal Medicine

## 2017-02-23 DIAGNOSIS — K760 Fatty (change of) liver, not elsewhere classified: Secondary | ICD-10-CM | POA: Insufficient documentation

## 2017-02-23 DIAGNOSIS — B1710 Acute hepatitis C without hepatic coma: Secondary | ICD-10-CM

## 2017-02-23 NOTE — Telephone Encounter (Signed)
I will work on this 02/25/2017 or 02/26/2017. 

## 2017-02-23 NOTE — Telephone Encounter (Signed)
Needs urine pregnancy

## 2017-02-23 NOTE — Telephone Encounter (Signed)
Tammy, Mavyret x 8 weeks.  

## 2017-02-25 LAB — PREGNANCY, URINE: PREG TEST UR: NEGATIVE

## 2017-02-25 NOTE — Telephone Encounter (Signed)
A PA has been completed. Patient may need to come by and sign the paper work that her insurance requires. Patient will be contacted with the outcome as soon as we know.

## 2017-03-03 NOTE — Telephone Encounter (Signed)
err

## 2017-03-05 ENCOUNTER — Telehealth (INDEPENDENT_AMBULATORY_CARE_PROVIDER_SITE_OTHER): Payer: Self-pay | Admitting: *Deleted

## 2017-03-05 NOTE — Telephone Encounter (Signed)
Terr- = Woodroe Chenarolyn Powell [facility] [place] has called and she would like to talk with you about the upcoming treatment for this patient. She may be reached at [phone] ect. [phone]

## 2017-03-09 NOTE — Telephone Encounter (Signed)
I spoke with Jacqueline Ellison. She stated I could talked with Jacqueline Ellison,. No change in her treatment.

## 2017-04-28 ENCOUNTER — Telehealth (INDEPENDENT_AMBULATORY_CARE_PROVIDER_SITE_OTHER): Payer: Self-pay | Admitting: Internal Medicine

## 2017-04-28 NOTE — Telephone Encounter (Signed)
No answer at home.   Hope, when she picks her medication up tomorrow, make an OV in 8 weeks.   Tammy, Hepatic, CBC and Hep C quaint in 4 weeks after she pick her meds up tomorrow.

## 2017-04-28 NOTE — Telephone Encounter (Signed)
Patient will come by Office tomorrow and pick up medications.

## 2017-04-29 ENCOUNTER — Other Ambulatory Visit (INDEPENDENT_AMBULATORY_CARE_PROVIDER_SITE_OTHER): Payer: Self-pay | Admitting: *Deleted

## 2017-04-29 ENCOUNTER — Telehealth (INDEPENDENT_AMBULATORY_CARE_PROVIDER_SITE_OTHER): Payer: Self-pay | Admitting: Internal Medicine

## 2017-04-29 DIAGNOSIS — B182 Chronic viral hepatitis C: Secondary | ICD-10-CM

## 2017-04-29 NOTE — Telephone Encounter (Signed)
Ok. She will not get her second round of medications till we see her again

## 2017-04-29 NOTE — Telephone Encounter (Signed)
Patient presented to the office today for her medication.  Jacqueline Ellison went over this with her and she was given the medication.  I attempted to make her an eight week follow up appointment, she stated she can only come in on Friday afternoons or Thursday's at 4:30pm because she is in classes from 8-4 every day.  She left without making an appointment.  She stated that she would call when she could work out something to come.

## 2017-04-29 NOTE — Telephone Encounter (Signed)
Labs are notes for 05/27/2017.

## 2017-05-01 ENCOUNTER — Other Ambulatory Visit (INDEPENDENT_AMBULATORY_CARE_PROVIDER_SITE_OTHER): Payer: Self-pay | Admitting: *Deleted

## 2017-05-01 ENCOUNTER — Encounter (INDEPENDENT_AMBULATORY_CARE_PROVIDER_SITE_OTHER): Payer: Self-pay | Admitting: *Deleted

## 2017-05-01 DIAGNOSIS — B182 Chronic viral hepatitis C: Secondary | ICD-10-CM

## 2017-05-18 ENCOUNTER — Encounter (HOSPITAL_COMMUNITY): Payer: Self-pay

## 2017-05-18 ENCOUNTER — Emergency Department (HOSPITAL_COMMUNITY)
Admission: EM | Admit: 2017-05-18 | Discharge: 2017-05-18 | Disposition: A | Discharge status: Home / Self Care | Payer: Medicaid Other | Attending: Emergency Medicine | Admitting: Emergency Medicine

## 2017-05-18 DIAGNOSIS — F17210 Nicotine dependence, cigarettes, uncomplicated: Secondary | ICD-10-CM | POA: Diagnosis not present

## 2017-05-18 DIAGNOSIS — R05 Cough: Secondary | ICD-10-CM | POA: Diagnosis not present

## 2017-05-18 DIAGNOSIS — J069 Acute upper respiratory infection, unspecified: Secondary | ICD-10-CM | POA: Diagnosis not present

## 2017-05-18 DIAGNOSIS — Z79899 Other long term (current) drug therapy: Secondary | ICD-10-CM | POA: Diagnosis not present

## 2017-05-18 DIAGNOSIS — B9789 Other viral agents as the cause of diseases classified elsewhere: Secondary | ICD-10-CM | POA: Diagnosis not present

## 2017-05-18 MED ORDER — MAGIC MOUTHWASH W/LIDOCAINE: 5.0000 mL | Freq: Three times a day (TID) | ORAL | 0 refills | Status: DC | PRN

## 2017-05-18 MED ORDER — PSEUDOEPHEDRINE HCL 60 MG PO TABS: 60.0000 mg | ORAL_TABLET | Freq: Four times a day (QID) | ORAL | 0 refills | Status: DC | PRN

## 2017-05-18 NOTE — Discharge Instructions (Signed)
Drink plenty of fluids.  Tylenol or ibuprofen if needed for fever.  Follow-up with your doctor for recheck if needed

## 2017-05-18 NOTE — ED Triage Notes (Signed)
Pt reports that has had congestion and coughing for 3 days. Reports that cough is finally productive. Also, complaining of HA and sorethroat

## 2017-05-19 NOTE — ED Provider Notes (Signed)
[facility] DE[facility] Provider Note   CSN: [id number] Arrival date & time: 05/18/17  1831     History   Chief Complaint Chief Complaint  Patient presents with  . Cough    HPI Jacqueline Ellison is a 21 y.o. female.  HPI   Jacqueline Ellison is a 21 y.o. female who presents to the Emergency Department complaining of cough, sore throat, and nasal congestion. Symptoms began 3 days ago. Cough is occasionally productive. She states that her daughter has similar symptoms. She believes that she has a sinus infection.She has not been taking any over-the-counter medications for her symptoms. She denies any chest pain, shortness of breath, fever, chills, vomiting or diarrhea.   Past Medical History:  Diagnosis Date  . Anxiety   . Arthritis   . Chlamydia infection   . Mental disorder    depression    Patient Active Problem List   Diagnosis Date Noted  . 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 Encounter for initial prescription of contraceptive pills 07/02/2016  . Postpartum care and examination 07/02/2016  . Breech presentation delivered 04/25/2016    Class: Acute  . Active labor at term 04/25/2016  . Postpartum care following cesarean delivery 04/25/2016  . Depression 01/14/2016  . Marijuana use 01/14/2016  . Chlamydia infection affecting pregnancy in first trimester, antepartum 10/22/2015  . Supervision of normal first pregnancy 10/03/2015  . possible history of hypothyroidism as a child   . Arthritis   . Anxiety     Past Surgical History:  Procedure Laterality Date  . CESAREAN SECTION N/A 04/25/2016   Procedure: CESAREAN SECTION;  Surgeon: Reva Boresanya S Pratt, MD;  [facility];  Service: Obstetrics;  Laterality: N/A;  . HERNIA REPAIR      OB History    Gravida Para Term Preterm AB Living   1 1 1     1    SAB TAB Ectopic Multiple Live Births         0 1       Home Medications    Prior to Admission medications   Medication Sig Start Date End Date Taking?  Authorizing Provider  buprenorphine-naloxone (SUBOXONE) 8-2 MG SUBL SL tablet Place 2 tablets under the tongue daily.     [provider]  magic mouthwash w/lidocaine SOLN Take 5 mLs by mouth 3 (three) times daily as needed for mouth pain. Swish and spit, do not swallow 05/18/17   Miasia Crabtree, PA-C  pseudoephedrine (SUDAFED) 60 MG tablet Take 1 tablet (60 mg total) by mouth every 6 (six) hours as needed for congestion. 05/18/17   Darcel Frane, PA-C  traZODone (DESYREL) 25 mg TABS tablet Take 50 mg by mouth at bedtime.    [provider]    Family History Family History  Problem Relation Age of Onset  . Hypertension Mother   . Diabetes Mother   . Hypertension Father   . Hypertension Sister   . Hypertension Maternal Grandmother   . Diabetes Maternal Grandmother   . Hypertension Maternal Grandfather   . Aneurysm Maternal Grandfather   . Hypertension Paternal Grandmother   . Hypertension Paternal Grandfather   . Heart disease Paternal Grandfather     Social History Social History  Substance Use Topics  . Smoking status: Current Every Day Smoker    Packs/day: 1.00    Years: 5.00    Types: Cigarettes  . Smokeless tobacco: Never Used  . Alcohol use No     Allergies   Tylenol [acetaminophen]  Review of Systems Review of Systems  Constitutional: Negative for activity change, appetite change, chills and fever.  HENT: Positive for congestion, sinus pain and sore throat. Negative for facial swelling, rhinorrhea, trouble swallowing and voice change.   Eyes: Negative for visual disturbance.  Respiratory: Positive for cough. Negative for chest tightness, shortness of breath, wheezing and stridor.   Cardiovascular: Negative for chest pain.  Gastrointestinal: Negative for abdominal pain, nausea and vomiting.  Musculoskeletal: Negative for neck pain and neck stiffness.  Skin: Negative for rash.  Neurological: Negative for dizziness, weakness, numbness and  headaches.  Hematological: Negative for adenopathy.  Psychiatric/Behavioral: Negative for confusion.  All other systems reviewed and are negative.    Physical Exam Updated Vital Signs BP 112/69 (BP Location: Left Arm)   Temp 99.1 F (37.3 C) (Oral)   Resp 14   Ht 5\' 1"  (1.549 m)   Wt 45.4 kg (100 lb)   SpO2 97%   BMI 18.89 kg/m   Physical Exam  Constitutional: She is oriented to person, place, and time. She appears well-developed and well-nourished. No distress.  HENT:  Head: Normocephalic and atraumatic.  Right Ear: Tympanic membrane and ear canal normal.  Left Ear: Tympanic membrane and ear canal normal.  Nose: Mucosal edema and rhinorrhea present.  Mouth/Throat: Uvula is midline and mucous membranes are normal. No trismus in the jaw. No uvula swelling. Posterior oropharyngeal erythema present. No oropharyngeal exudate, posterior oropharyngeal edema or tonsillar abscesses.  Eyes: Conjunctivae are normal.  Neck: Normal range of motion and phonation normal. Neck supple. No Brudzinski's sign and no Kernig's sign noted.  Cardiovascular: Normal rate, regular rhythm, normal heart sounds and intact distal pulses.   No murmur heard. Pulmonary/Chest: Effort normal and breath sounds normal. No respiratory distress. She has no wheezes. She has no rales.  Abdominal: Soft. She exhibits no distension. There is no tenderness. There is no rebound and no guarding.  Musculoskeletal: She exhibits no edema.  Lymphadenopathy:    She has no cervical adenopathy.  Neurological: She is alert and oriented to person, place, and time. No sensory deficit. She exhibits normal muscle tone. Coordination normal.  Skin: Skin is warm and dry. Capillary refill takes less than 2 seconds.  Nursing note and vitals reviewed.    ED Treatments / Results  Labs (all labs ordered are listed, but only abnormal results are displayed) Labs Reviewed - No data to display  EKG  EKG Interpretation None        Radiology No results found.  Procedures Procedures (including critical care time)  Medications Ordered in ED Medications - No data to display   Initial Impression / Assessment and Plan / ED Course  I have reviewed the triage vital signs and the nursing notes.  Pertinent labs & imaging results that were available during my care of the patient were reviewed by me and considered in my medical decision making (see chart for details).     Patient well-appearing. Vitals within normal limits. nontoxic. Symptoms are likely viral.  She agrees to treatment plan with prescription for Sudafed and Magic mouthwash.   Final Clinical Impressions(s) / ED Diagnoses   Final diagnoses:  Viral URI with cough    New Prescriptions Discharge Medication List as of 05/18/2017  7:45 PM    START taking these medications   Details  magic mouthwash w/lidocaine SOLN Take 5 mLs by mouth 3 (three) times daily as needed for mouth pain. Swish and spit, do not swallow, Starting Mon 05/18/2017, Print  pseudoephedrine (SUDAFED) 60 MG tablet Take 1 tablet (60 mg total) by mouth every 6 (six) hours as needed for congestion., Starting Mon 05/18/2017, [address] [place], PA-C 05/19/17 4098    Benjiman Core, MD 05/20/17 [phone]

## 2017-06-03 ENCOUNTER — Telehealth (INDEPENDENT_AMBULATORY_CARE_PROVIDER_SITE_OTHER): Payer: Self-pay | Admitting: Internal Medicine

## 2017-06-03 NOTE — Telephone Encounter (Signed)
I called this patient per your request.  She stated that she didn't have her labs done last week because her and her baby were really sick and in the hospital.  She is going to try to go do her labs tomorrow on her lunch break.  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  However, she could come in on a Thursday at 3:30pm, but would have to be seen right at 3:30pm because she has to go directly to work.  You do not have anything on a Thursday at 3:30pm until July 16, 2017 and she has an appointment on 08/14/17/ already.  Please advise what you want to do.  [phone]

## 2017-06-03 NOTE — Telephone Encounter (Signed)
Thursday November 1 is okay

## 2017-06-04 NOTE — Telephone Encounter (Signed)
Spoke to the patient, she scheduled for 07/16/17.  She is also going for her labs today.

## 2017-06-06 LAB — HEPATIC FUNCTION PANEL
AG Ratio: 1.3 (calc) (ref 1.0–2.5)
ALKALINE PHOSPHATASE (APISO): 91 U/L (ref 33–115)
ALT: 10 U/L (ref 6–29)
AST: 15 U/L (ref 10–30)
Albumin: 4 g/dL (ref 3.6–5.1)
BILIRUBIN INDIRECT: 0.4 mg/dL (ref 0.2–1.2)
Bilirubin, Direct: 0.1 mg/dL (ref 0.0–0.2)
Globulin: 3 g/dL (calc) (ref 1.9–3.7)
TOTAL PROTEIN: 7 g/dL (ref 6.1–8.1)
Total Bilirubin: 0.5 mg/dL (ref 0.2–1.2)

## 2017-06-06 LAB — CBC
HCT: 39 % (ref 35.0–45.0)
Hemoglobin: 13.1 g/dL (ref 11.7–15.5)
MCH: 28.4 pg (ref 27.0–33.0)
MCHC: 33.6 g/dL (ref 32.0–36.0)
MCV: 84.6 fL (ref 80.0–100.0)
MPV: 11.8 fL (ref 7.5–12.5)
PLATELETS: 256 10*3/uL (ref 140–400)
RBC: 4.61 10*6/uL (ref 3.80–5.10)
RDW: 12 % (ref 11.0–15.0)
WBC: 6.5 10*3/uL (ref 3.8–10.8)

## 2017-06-06 LAB — HEPATITIS C RNA QUANTITATIVE
HCV QUANT LOG: NOT DETECTED {Log_IU}/mL
HCV RNA, PCR, QN: <15 IU/mL

## 2017-07-16 ENCOUNTER — Ambulatory Visit (INDEPENDENT_AMBULATORY_CARE_PROVIDER_SITE_OTHER): Payer: Medicaid Other | Admitting: Internal Medicine

## 2017-07-16 ENCOUNTER — Encounter (INDEPENDENT_AMBULATORY_CARE_PROVIDER_SITE_OTHER): Payer: Self-pay

## 2017-07-16 ENCOUNTER — Encounter (INDEPENDENT_AMBULATORY_CARE_PROVIDER_SITE_OTHER): Payer: Self-pay | Admitting: Internal Medicine

## 2017-07-16 DIAGNOSIS — B182 Chronic viral hepatitis C: Secondary | ICD-10-CM | POA: Diagnosis not present

## 2017-07-16 DIAGNOSIS — B1920 Unspecified viral hepatitis C without hepatic coma: Secondary | ICD-10-CM

## 2017-07-16 HISTORY — DX: Unspecified viral hepatitis C without hepatic coma: B19.20

## 2017-07-16 NOTE — Progress Notes (Signed)
   Subjective:    Patient ID: Jacqueline Ellison, female    DOB: 07/07/1996, 21 y.o.   MRN: [id number]  HPI Here today for f/u of her Hep C. Treated with Mayvret x 8 weeks. She started treatment in August.  She has cleared the virus.  Genotype 1A She tells me she is doing good.  Continues to work full time at [employer]. 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 Feels good.  Appetite is good.   06/04/2017 Hep C RNA not detected  Hepatic Function Latest Ref Rng & Units 06/04/2017 02/16/2017 02/12/2017  Total Protein 6.1 - 8.1 g/dL 7.0 7.1 6.6  Albumin 3.6 - 5.1 g/dL - 4.3 4.1  AST 10 - 30 U/L 15 106(H) 82(H)  ALT 6 - 29 U/L 10 125(H) 92(H)  Alk Phosphatase 33 - 115 U/L - 94 88  Total Bilirubin 0.2 - 1.2 mg/dL 0.5 0.6 0.4  Bilirubin, Direct 0.0 - 0.2 mg/dL 0.1 0.2 0.1   CBC    Component Value Date/Time   WBC 6.5 06/04/2017 1154   RBC 4.61 06/04/2017 1154   HGB 13.1 06/04/2017 1154   HGB 11.8 02/14/2016 0909   HCT 39.0 06/04/2017 1154   HCT 34.0 02/14/2016 0909   PLT 256 06/04/2017 1154   PLT 188 02/14/2016 0909   MCV 84.6 06/04/2017 1154   MCV 89 02/14/2016 0909   MCH 28.4 06/04/2017 1154   MCHC 33.6 06/04/2017 1154   RDW 12.0 06/04/2017 1154   RDW 13.1 02/14/2016 0909   LYMPHSABS 2,401 02/16/2017 1045   MONOABS 343 02/16/2017 1045   EOSABS 147 02/16/2017 1045   BASOSABS 0 02/16/2017 1045   02/23/2017 US elast F2 -F3  Review of Systems Past Medical History:  Diagnosis Date  . Anxiety   . Arthritis   . Chlamydia infection   . Hepatitis C 07/16/2017  . Mental disorder    depression    Past Surgical History:  Procedure Laterality Date  . CESAREAN SECTION N/A 04/25/2016   Procedure: CESAREAN SECTION;  Surgeon: Tanya S Pratt, MD;  [facility];  Service: Obstetrics;  Laterality: N/A;  . HERNIA REPAIR      Allergies  Allergen Reactions  . Tylenol [Acetaminophen] Nausea And Vomiting    Current Outpatient Prescriptions on File Prior to Visit  Medication Sig Dispense Refill   . buprenorphine-naloxone (SUBOXONE) 8-2 MG SUBL SL tablet Place 2 tablets under the tongue daily.      No current facility-administered medications on file prior to visit.         Objective:   Physical Exam Blood pressure (!) 104/50, pulse 72, temperature 98.6 F (37 C), height 5' 1" (1.549 m), weight 95 lb 6.4 oz (43.3 kg), not currently breastfeeding. Alert and oriented. Skin warm and dry. Oral mucosa is moist.   . Sclera anicteric, conjunctivae is pink. Thyroid not enlarged. No cervical lymphadenopathy. Lungs clear. Heart regular rate and rhythm.  Abdomen is soft. Bowel sounds are positive. No hepatomegaly. No abdominal masses felt. No tenderness.  No edema to lower extremities.          Assessment & Plan:  Hepatitis C. She has cleared the virus. Will schedule and US abdomen for December. OV in 1 year. 

## 2017-07-16 NOTE — Patient Instructions (Signed)
OV in 1 year.  

## 2017-08-14 ENCOUNTER — Ambulatory Visit (INDEPENDENT_AMBULATORY_CARE_PROVIDER_SITE_OTHER): Payer: Medicaid Other | Admitting: Internal Medicine

## 2017-08-18 ENCOUNTER — Encounter (INDEPENDENT_AMBULATORY_CARE_PROVIDER_SITE_OTHER): Payer: Self-pay | Admitting: *Deleted

## 2017-09-17 ENCOUNTER — Emergency Department (HOSPITAL_COMMUNITY): Payer: No Typology Code available for payment source

## 2017-09-17 ENCOUNTER — Emergency Department (HOSPITAL_COMMUNITY)
Admission: EM | Admit: 2017-09-17 | Discharge: 2017-09-17 | Disposition: A | Discharge status: Home / Self Care | Payer: No Typology Code available for payment source | Attending: Emergency Medicine | Admitting: Emergency Medicine

## 2017-09-17 ENCOUNTER — Encounter (HOSPITAL_COMMUNITY): Payer: Self-pay | Admitting: Emergency Medicine

## 2017-09-17 DIAGNOSIS — S39012A Strain of muscle, fascia and tendon of lower back, initial encounter: Secondary | ICD-10-CM | POA: Diagnosis not present

## 2017-09-17 DIAGNOSIS — Y999 Unspecified external cause status: Secondary | ICD-10-CM | POA: Diagnosis not present

## 2017-09-17 DIAGNOSIS — F17210 Nicotine dependence, cigarettes, uncomplicated: Secondary | ICD-10-CM | POA: Diagnosis not present

## 2017-09-17 DIAGNOSIS — Z79899 Other long term (current) drug therapy: Secondary | ICD-10-CM | POA: Insufficient documentation

## 2017-09-17 DIAGNOSIS — Y9389 Activity, other specified: Secondary | ICD-10-CM | POA: Insufficient documentation

## 2017-09-17 DIAGNOSIS — Y929 Unspecified place or not applicable: Secondary | ICD-10-CM | POA: Diagnosis not present

## 2017-09-17 DIAGNOSIS — R51 Headache: Secondary | ICD-10-CM | POA: Diagnosis present

## 2017-09-17 LAB — POC URINE PREG, ED: Preg Test, Ur: NEGATIVE

## 2017-09-17 MED ORDER — IBUPROFEN 600 MG PO TABS: 600.0000 mg | ORAL_TABLET | Freq: Four times a day (QID) | ORAL | 0 refills | Status: DC | PRN

## 2017-09-17 MED ORDER — CYCLOBENZAPRINE HCL 10 MG PO TABS: 10.0000 mg | ORAL_TABLET | Freq: Three times a day (TID) | ORAL | 0 refills | Status: DC | PRN

## 2017-09-17 MED ORDER — CYCLOBENZAPRINE HCL 10 MG PO TABS
10.0000 mg | ORAL_TABLET | Freq: Once | ORAL | Status: AC
  Administered 2017-09-17: 10 mg via ORAL
  Filled 2017-09-17: qty 1

## 2017-09-17 MED ORDER — IBUPROFEN 400 MG PO TABS
400.0000 mg | ORAL_TABLET | Freq: Once | ORAL | Status: AC
  Administered 2017-09-17: 400 mg via ORAL
  Filled 2017-09-17: qty 1
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
## 2017-09-17 NOTE — Discharge Instructions (Signed)
Apply ice packs on and off to your neck and back.  Follow-up with your primary doctor in a few days for recheck.  Return to the ER for any worsening symptoms.
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
## 2017-12-03 ENCOUNTER — Ambulatory Visit (INDEPENDENT_AMBULATORY_CARE_PROVIDER_SITE_OTHER): Payer: Medicaid Other | Admitting: Orthopaedic Surgery

## 2018-03-28 IMAGING — US US ABDOMEN COMPLETE W/ ELASTOGRAPHY
2 series · 13 of 25 positions shown · non-contrast
Comparison: Abdominal ultrasound 12/10/2016.

CLINICAL DATA: Patient with hepatitis-C.



[Series 1: us abdomen complete w/ elastography · 0.15mm/px · 11 of 113 slices shown (1 of 2)]
[im 1/113]
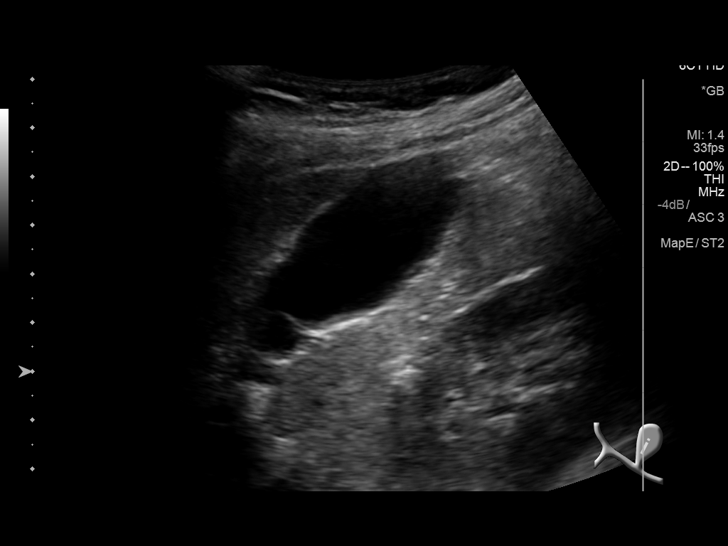
[im 11/113]
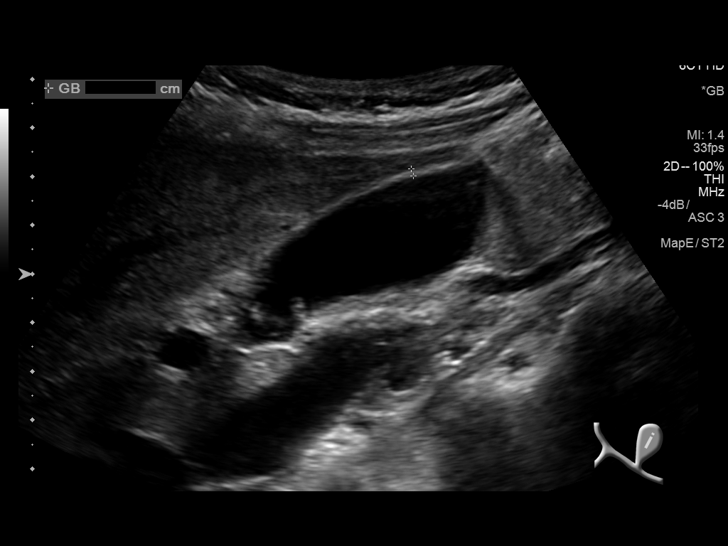
[im 22/113]
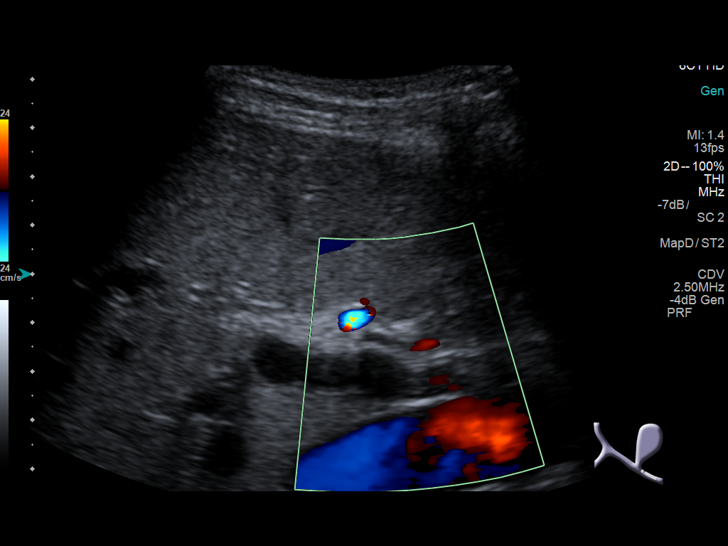
[im 33/113]
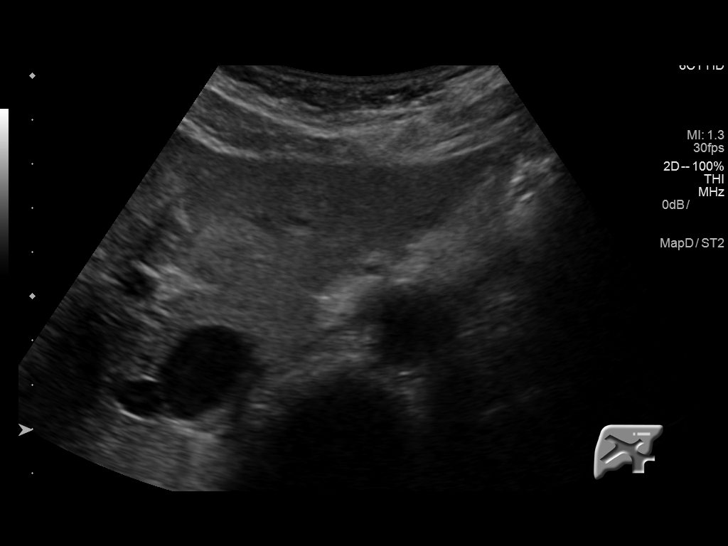
[im 43/113]
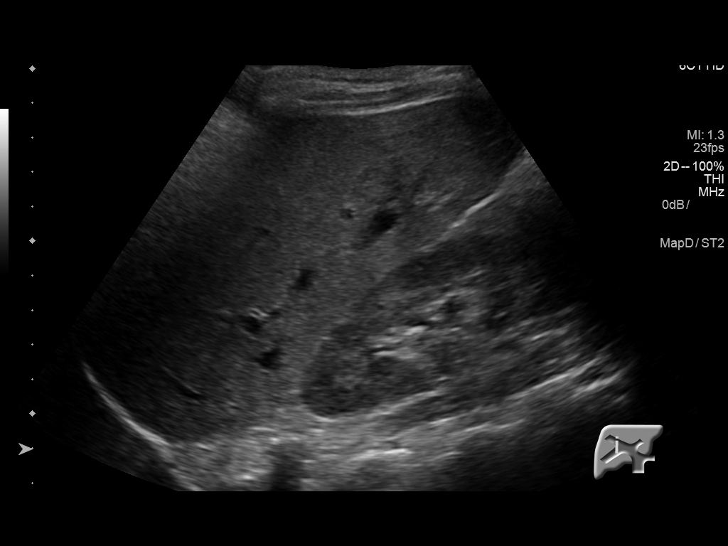
[im 54/113]
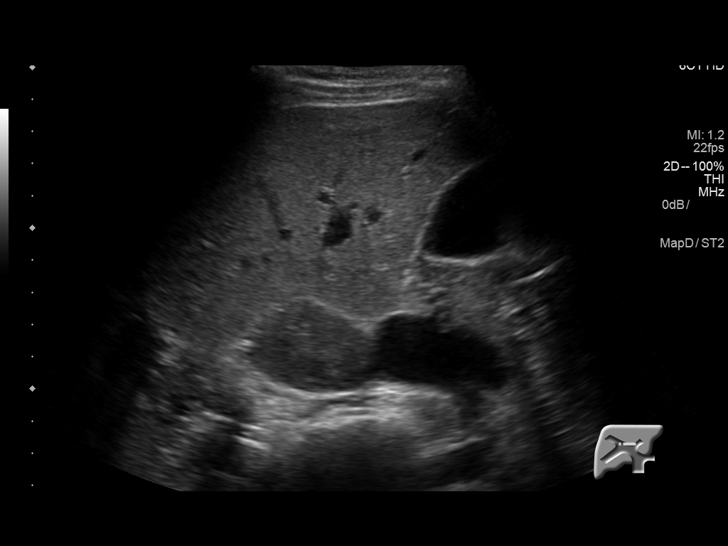
[im 65/113]
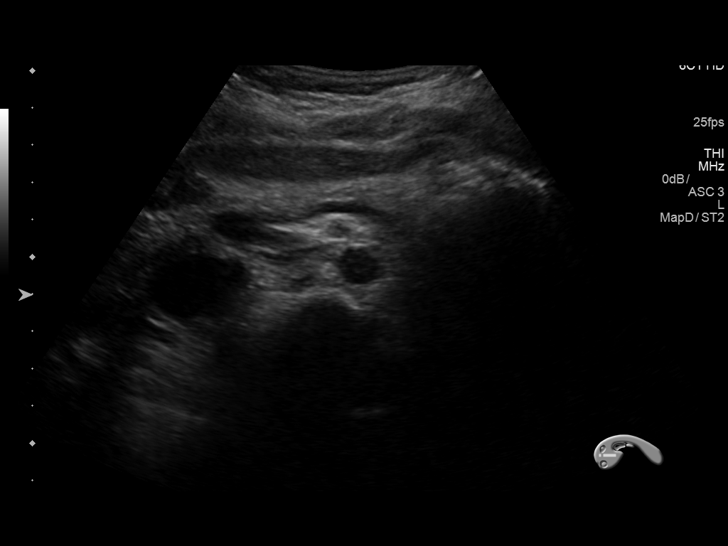
[im 75/113]
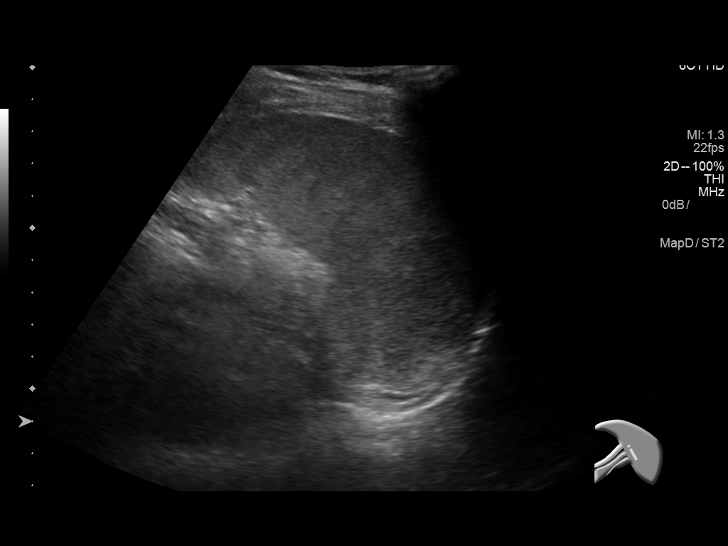
[im 86/113]
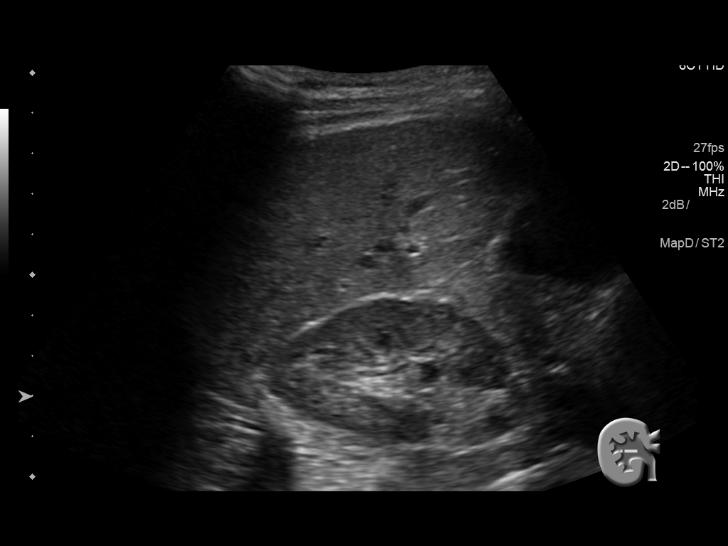
[im 97/113]
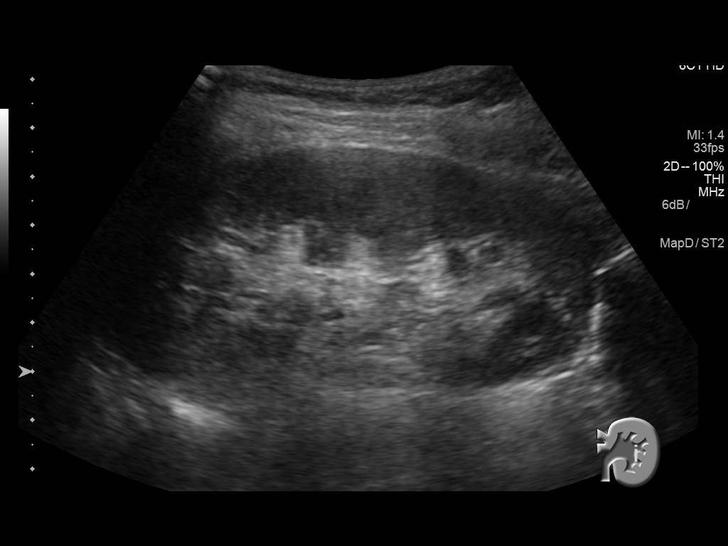
[im 107/113]
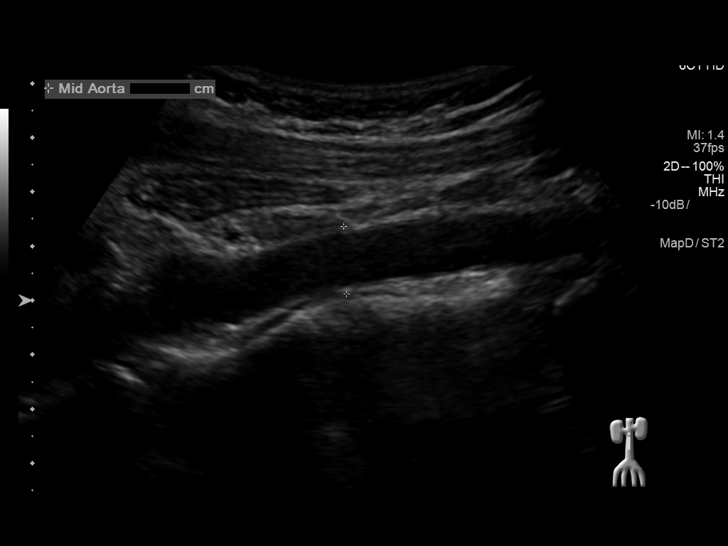

[Series 2001: us abdomen complete w/ elastography · 0.13mm/px · 2 of 14 slices shown (2 of 2)]
[im 1/14]
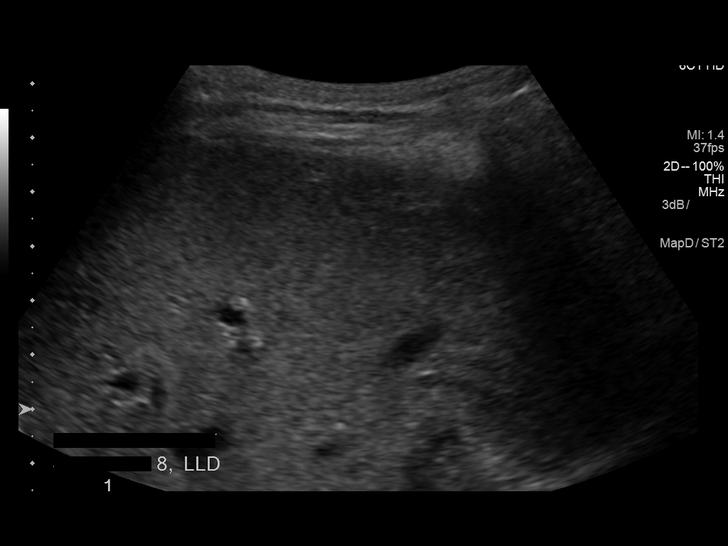
[im 14/14]
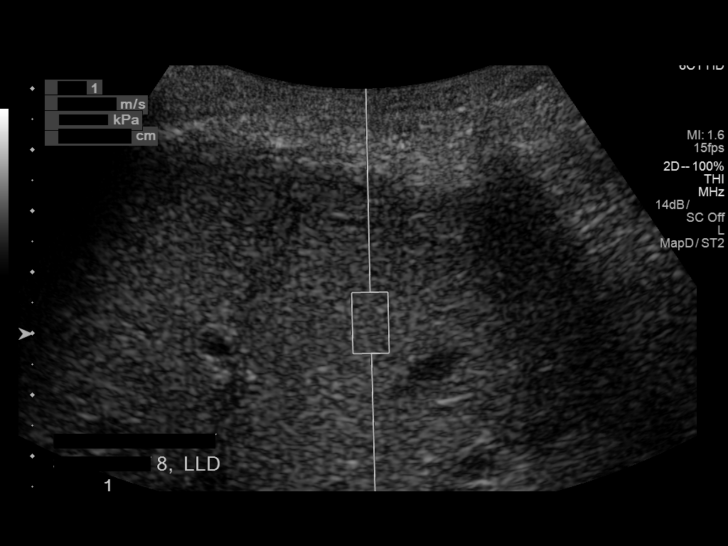

[13 of 25 positions shown; findings below may reference images not displayed]

FINDINGS: ULTRASOUND ABDOMEN

Gallbladder: No gallstones or wall thickening visualized. No
sonographic Murphy sign noted by sonographer.

Common bile duct: Diameter: 2 mm

Liver: Diffusely increased in echogenicity. No focal lesion
identified.

IVC: No abnormality visualized.

Pancreas: Visualized portion unremarkable.

Spleen: Size and appearance within normal limits.

Right Kidney: Length: 10.0 cm. Echogenicity within normal limits. No
mass or hydronephrosis visualized.

Left Kidney: Length: 10.7 cm. Echogenicity within normal limits. No
mass or hydronephrosis visualized.

Abdominal aorta: No aneurysm visualized.

Other findings: None.

ULTRASOUND HEPATIC ELASTOGRAPHY

Device: Siemens Helix VTQ

Patient position: Left Lateral Decubitus

Transducer 6C1

Number of measurements: 10

Hepatic segment:  8

Median velocity:   1.99  m/sec

IQR:

IQR/Median velocity ratio:

Corresponding Metavir fibrosis score:  F2 + some F3

Risk of fibrosis: Moderate

Limitations of exam: None

Pertinent findings noted on other imaging exams:  None

Please note that abnormal shear wave velocities may also be
identified in clinical settings other than with hepatic fibrosis,
such as: acute hepatitis, elevated right heart and central venous
pressures including use of beta blockers, Jannet disease
(Taj), infiltrative processes such as
mastocytosis/amyloidosis/infiltrative tumor, extrahepatic
cholestasis, in the post-prandial state, and liver transplantation.
Correlation with patient history, laboratory data, and clinical
condition recommended.
IMPRESSION: ULTRASOUND ABDOMEN:
Hepatic steatosis.

ULTRASOUND HEPATIC ELASTOGRAPHY:

Median hepatic shear wave velocity is calculated at 1.99 m/sec.

Corresponding Metavir fibrosis score is  F2 + some F3.

Risk of fibrosis is Moderate.

Follow-up: Additional testing appropriate

## 2018-06-04 ENCOUNTER — Ambulatory Visit: Payer: Medicaid Other | Admitting: Women's Health

## 2018-06-18 ENCOUNTER — Ambulatory Visit: Payer: Medicaid Other | Admitting: Women's Health

## 2018-07-02 ENCOUNTER — Encounter: Payer: Self-pay | Admitting: Women's Health

## 2018-07-02 ENCOUNTER — Ambulatory Visit: Payer: Medicaid Other | Admitting: Women's Health

## 2018-07-02 VITALS — BP 103/64 | HR 76 | Ht 61.0 in | Wt 105.2 lb

## 2018-07-02 DIAGNOSIS — Z3202 Encounter for pregnancy test, result negative: Secondary | ICD-10-CM

## 2018-07-02 DIAGNOSIS — Z30013 Encounter for initial prescription of injectable contraceptive: Secondary | ICD-10-CM | POA: Diagnosis not present

## 2018-07-02 DIAGNOSIS — Z113 Encounter for screening for infections with a predominantly sexual mode of transmission: Secondary | ICD-10-CM

## 2018-07-02 DIAGNOSIS — Z98891 History of uterine scar from previous surgery: Secondary | ICD-10-CM | POA: Insufficient documentation

## 2018-07-02 LAB — POCT URINE PREGNANCY: Preg Test, Ur: NEGATIVE

## 2018-07-02 MED ORDER — MEDROXYPROGESTERONE ACETATE 150 MG/ML IM SUSP: 150.0000 mg | INTRAMUSCULAR | 3 refills | Status: DC

## 2018-07-02 NOTE — Patient Instructions (Signed)

## 2018-07-02 NOTE — Progress Notes (Signed)
   GYN VISIT Patient name: Jacqueline Ellison MRN [id number]  Date of birth: 1995/12/01 Chief Complaint:   talk about getting back (depo)  History of Present Illness:   Jacqueline Ellison is a 22 y.o. G56P1001 Caucasian female being seen today to get back on depo. Not currently on contraception, has only had sex once since daughter born 2 years ago. Just got period back few months ago, has clear tissue about 2 weeks after period. No pain, denies abnormal discharge, itching/odor/irritation.       Patient's last menstrual period was 06/30/2018. The current method of family planning is abstinence. Last pap unsure but thinks w/in last 20yrs at [facility] or [facility]. Results were:  normal Review of Systems:   Pertinent items are noted in HPI Denies fever/chills, dizziness, headaches, visual disturbances, fatigue, shortness of breath, chest pain, abdominal pain, vomiting, abnormal vaginal discharge/itching/odor/irritation, problems with periods, bowel movements, urination, or intercourse unless otherwise stated above.  Pertinent History Reviewed:  Reviewed past medical,surgical, social, obstetrical and family history.  Reviewed problem list, medications and allergies. Physical Assessment:   Vitals:   07/02/18 0913  BP: 103/64  Pulse: 76  Weight: 105 lb 3.2 oz (47.7 kg)  Height: 5\' 1"  (1.549 m)  Body mass index is 19.88 kg/m.       Physical Examination:   General appearance: alert, well appearing, and in no distress  Mental status: alert, oriented to person, place, and time  Skin: warm & dry   Cardiovascular: normal heart rate noted  Respiratory: normal respiratory effort, no distress  Abdomen: soft, non-tender   Pelvic: examination not indicated  Extremities: no edema   Results for orders placed or performed in visit on 07/02/18 (from the past 24 hour(s))  POCT urine pregnancy   Collection Time: 07/02/18  9:54 AM  Result Value Ref Range   Preg Test, Ur Negative Negative    Assessment &  Plan:  1) Contraception management> rx depo w/ 3RF, condoms x 2wks  2) STD screen> gc/ct  Meds:  Meds ordered this encounter  Medications  . medroxyPROGESTERone (DEPO-PROVERA) 150 MG/ML injection    Sig: Inject 1 mL (150 mg total) into the muscle every 3 (three) months.    Dispense:  1 mL    Refill:  3    Order Specific Question:   Supervising Provider    Answer:   Duane Lope H [[id number]]    Orders Placed This Encounter  Procedures  . GC/Chlamydia Probe Amp  . POCT urine pregnancy    Return for Monday for depo; then for physical, get pap records from [facility] (or [facility]).  Cheral Marker CNM, [facility] 07/02/2018 9:54 AM

## 2018-07-04 LAB — GC/CHLAMYDIA PROBE AMP
Chlamydia trachomatis, NAA: NEGATIVE
Neisseria gonorrhoeae by PCR: NEGATIVE

## 2018-07-05 ENCOUNTER — Ambulatory Visit (INDEPENDENT_AMBULATORY_CARE_PROVIDER_SITE_OTHER): Payer: Medicaid Other

## 2018-07-05 VITALS — Ht 61.0 in | Wt 104.0 lb

## 2018-07-05 DIAGNOSIS — Z3042 Encounter for surveillance of injectable contraceptive: Secondary | ICD-10-CM

## 2018-07-05 DIAGNOSIS — Z3202 Encounter for pregnancy test, result negative: Secondary | ICD-10-CM

## 2018-07-05 LAB — POCT URINE PREGNANCY: PREG TEST UR: NEGATIVE

## 2018-07-05 MED ORDER — MEDROXYPROGESTERONE ACETATE 150 MG/ML IM SUSP
150.0000 mg | Freq: Once | INTRAMUSCULAR | Status: AC
  Administered 2018-07-05: 150 mg via INTRAMUSCULAR

## 2018-07-05 NOTE — Progress Notes (Signed)
Pt here for depo injection 150 mg IM given rt VG. Tolerated well. Return 12 weeks for next injection. Pad CMA 

## 2018-07-22 ENCOUNTER — Encounter (INDEPENDENT_AMBULATORY_CARE_PROVIDER_SITE_OTHER): Payer: Self-pay | Admitting: Internal Medicine

## 2018-07-22 ENCOUNTER — Ambulatory Visit (INDEPENDENT_AMBULATORY_CARE_PROVIDER_SITE_OTHER): Payer: Medicaid Other | Admitting: Internal Medicine

## 2018-07-22 VITALS — BP 128/70 | HR 80 | Temp 98.3°F | Ht 61.0 in | Wt 106.2 lb

## 2018-07-22 DIAGNOSIS — B182 Chronic viral hepatitis C: Secondary | ICD-10-CM | POA: Diagnosis not present

## 2018-07-22 NOTE — Progress Notes (Signed)
   Subjective:    Patient ID: Jacqueline Ellison, female    DOB: 09-23-1995, 22 y.o.   MRN: [id number]  HPI Here today for f/u. Last seen in November of 2018. Hx of Hepatitis C.  Treated with Mavyret x 8 weeks.  02/23/2017 [place] Elast F2-F3. Genotype 1A 06/04/2017 Hep C quaint undetected. Suboxone managed by [facility] in [place].  Doing good. 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  Her appetite is good. No weight loss.  BMs are normal.    Review of Systems Past Medical History:  Diagnosis Date  . Anxiety   . Arthritis   . Chlamydia infection   . Hepatitis C 07/16/2017  . Mental disorder    depression    Past Surgical History:  Procedure Laterality Date  . CESAREAN SECTION N/A 04/25/2016   Procedure: CESAREAN SECTION;  Surgeon: Reva Bores, MD;  Location: [facility]IRTHING SU[facility];  Service: Obstetrics;  Laterality: N/A;  . HERNIA REPAIR      Allergies  Allergen Reactions  . Tylenol [Acetaminophen] Nausea And Vomiting    Current Outpatient Medications on File Prior to Visit  Medication Sig Dispense Refill  . buprenorphine-naloxone (SUBOXONE) 8-2 MG SUBL SL tablet Place 2 tablets under the tongue daily.     . cyclobenzaprine (FLEXERIL) 10 MG tablet Take 1 tablet (10 mg total) by mouth 3 (three) times daily as needed. 21 tablet 0  . ibuprofen (ADVIL,MOTRIN) 600 MG tablet Take 1 tablet (600 mg total) by mouth every 6 (six) hours as needed. 30 tablet 0  . medroxyPROGESTERone (DEPO-PROVERA) 150 MG/ML injection Inject 1 mL (150 mg total) into the muscle every 3 (three) months. 1 mL 3   No current facility-administered medications on file prior to visit.         Objective:   Physical Exam Blood pressure 128/70, pulse 80, temperature 98.3 F (36.8 C), height 5\' 1"  (1.549 m), weight 106 lb 3.2 oz (48.2 kg), last menstrual period 06/30/2018. Alert and oriented. Skin warm and dry. Oral mucosa is moist.   . Sclera anicteric, conjunctivae is pink. Thyroid not enlarged. No cervical  lymphadenopathy. Lungs clear. Heart regular rate and rhythm.  Abdomen is soft. Bowel sounds are positive. No hepatomegaly. No abdominal masses felt. No tenderness.  No edema to lower extremities.          Assessment & Plan:  Hepatitis.C.Will get a Hepatic and Hep C quaint. OV in 1 year

## 2018-07-22 NOTE — Patient Instructions (Signed)
Labs. OV in 1 year. 

## 2018-07-28 LAB — HEPATIC FUNCTION PANEL
AG RATIO: 1.4 (calc) (ref 1.0–2.5)
ALBUMIN MSPROF: 3.8 g/dL (ref 3.6–5.1)
ALKALINE PHOSPHATASE (APISO): 78 U/L (ref 33–115)
ALT: 9 U/L (ref 6–29)
AST: 13 U/L (ref 10–30)
Bilirubin, Direct: 0.1 mg/dL (ref 0.0–0.2)
GLOBULIN: 2.8 g/dL (ref 1.9–3.7)
Indirect Bilirubin: 0.2 mg/dL (calc) (ref 0.2–1.2)
TOTAL PROTEIN: 6.6 g/dL (ref 6.1–8.1)
Total Bilirubin: 0.3 mg/dL (ref 0.2–1.2)

## 2018-07-28 LAB — HEPATITIS C RNA QUANTITATIVE
HCV Quantitative Log: <1.18 Log IU/mL
HCV RNA, PCR, QN: <15 IU/mL

## 2018-10-01 ENCOUNTER — Ambulatory Visit: Payer: Medicaid Other

## 2019-03-02 ENCOUNTER — Telehealth: Payer: Self-pay | Admitting: Adult Health

## 2019-03-02 NOTE — Telephone Encounter (Signed)
Patient called, is not currently on any birth control and hasn't been sexually active in over a year.  She is having a lot of bleeding and would like to get pill birth control.  [place]

## 2019-03-02 NOTE — Telephone Encounter (Signed)
Pt aware she needs an appt. Pt states she has a 25 year old and don't have a Public librarian. I spoke with JAG and she advised visit can be a televisit. Pt voiced understanding and call was transferred to front desk for appt. [place]

## 2019-03-07 ENCOUNTER — Telehealth: Payer: Self-pay | Admitting: Adult Health

## 2019-03-07 NOTE — Telephone Encounter (Signed)
Pt reminded that her appt on Wednesday is a tele visit.

## 2019-03-09 ENCOUNTER — Ambulatory Visit (INDEPENDENT_AMBULATORY_CARE_PROVIDER_SITE_OTHER): Payer: Medicaid Other | Admitting: Adult Health

## 2019-03-09 ENCOUNTER — Other Ambulatory Visit: Payer: Self-pay

## 2019-03-09 ENCOUNTER — Encounter: Payer: Self-pay | Admitting: Adult Health

## 2019-03-09 VITALS — Ht 61.0 in | Wt 118.0 lb

## 2019-03-09 DIAGNOSIS — Z30011 Encounter for initial prescription of contraceptive pills: Secondary | ICD-10-CM | POA: Insufficient documentation

## 2019-03-09 MED ORDER — LO LOESTRIN FE 1 MG-10 MCG / 10 MCG PO TABS: 1.0000 | ORAL_TABLET | Freq: Every day | ORAL | 11 refills | Status: DC

## 2019-03-09 NOTE — Progress Notes (Signed)
Patient ID: Jacqueline Ellison, female   DOB: 07/08/1996, 23 y.o.   MRN: [id number]   TELEHEALTH VIRTUAL GYNECOLOGY VISIT ENCOUNTER NOTE  I connected with Jacqueline Ellison on 03/09/19 at  9:00 AM EDT by telephone at home and verified that I am speaking with the correct person using two identifiers.   I discussed the limitations, risks, security and privacy concerns of performing an evaluation and management service by telephone and the availability of in person appointments. I also discussed with the patient that there may be a patient responsible charge related to this service. The patient expressed understanding and agreed to proceed.   History:  Jacqueline Ellison is a 23 y.o. G59P1001 female being evaluated today for getting on OCs, she was on depo and acne got worse so she stopped it, last injection October. She has had irregular bleeding, and is still bleeding from 6/11 period.She does smoke about 5 cigarettes per day and is trying to stop. She denies any abnormal vaginal discharge, pelvic pain or other concerns.       Past Medical History:  Diagnosis Date  . Anxiety   . Arthritis   . Chlamydia infection   . Hepatitis C 07/16/2017  . Mental disorder    depression   Past Surgical History:  Procedure Laterality Date  . CESAREAN SECTION N/A 04/25/2016   Procedure: CESAREAN SECTION;  Surgeon: Donnamae Jude, MD;  Location: [facility];  Service: Obstetrics;  Laterality: N/A;  . HERNIA REPAIR     The following portions of the patient's history were reviewed and updated as appropriate: allergies, current medications, past family history, past medical history, past social history, past surgical history and problem list.   Health Maintenance:  Needs pap  Review of Systems:  Pertinent items noted in HPI and remainder of comprehensive ROS otherwise negative.  Physical Exam:   General:  Alert, oriented and cooperative.   Mental Status: Normal mood and affect perceived. Normal judgment  and thought content.  Physical exam deferred due to nature of the encounter Ht 5\' 1"  (1.549 m)   Wt 118 lb (53.5 kg)   LMP 02/24/2019   BMI 22.30 kg/m per pt.  Labs and Imaging No results found for this or any previous visit (from the past 336 hour(s)). No results found.    Assessment and Plan:     1. Encounter for initial prescription of contraceptive pills Start OCsw Sunday and use condoms for atleast 1 pack Return in 3 months for pap and physical        I discussed the assessment and treatment plan with the patient. The patient was provided an opportunity to ask questions and all were answered. The patient agreed with the plan and demonstrated an understanding of the instructions.   The patient was advised to call back or seek an in-person evaluation/go to the ED if the symptoms worsen or if the condition fails to improve as anticipated.  I provided 6 minutes of non-face-to-face time during this encounter.   Derrek Monaco, NP [facility], [place]

## 2019-06-03 ENCOUNTER — Other Ambulatory Visit: Payer: Medicaid Other | Admitting: Adult Health

## 2019-07-04 ENCOUNTER — Encounter (INDEPENDENT_AMBULATORY_CARE_PROVIDER_SITE_OTHER): Payer: Self-pay | Admitting: *Deleted

## 2019-07-25 ENCOUNTER — Ambulatory Visit (INDEPENDENT_AMBULATORY_CARE_PROVIDER_SITE_OTHER): Payer: Medicaid Other | Admitting: Nurse Practitioner

## 2019-07-28 ENCOUNTER — Other Ambulatory Visit: Payer: Medicaid Other | Admitting: Adult Health

## 2019-09-27 ENCOUNTER — Encounter (INDEPENDENT_AMBULATORY_CARE_PROVIDER_SITE_OTHER): Payer: Medicaid Other | Admitting: Internal Medicine

## 2019-09-27 ENCOUNTER — Other Ambulatory Visit: Payer: Self-pay

## 2019-10-01 NOTE — Progress Notes (Signed)
Patient was no-show

## 2019-10-19 NOTE — Patient Instructions (Signed)
error 

## 2019-12-22 ENCOUNTER — Telehealth: Payer: Self-pay | Admitting: Obstetrics & Gynecology

## 2019-12-22 NOTE — Telephone Encounter (Signed)

## 2019-12-23 ENCOUNTER — Telehealth: Payer: Self-pay | Admitting: *Deleted

## 2019-12-23 ENCOUNTER — Encounter: Payer: Self-pay | Admitting: *Deleted

## 2019-12-23 ENCOUNTER — Other Ambulatory Visit: Payer: Medicaid Other

## 2019-12-23 ENCOUNTER — Other Ambulatory Visit: Payer: Self-pay

## 2019-12-23 DIAGNOSIS — O039 Complete or unspecified spontaneous abortion without complication: Secondary | ICD-10-CM
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
## 2019-12-24 LAB — BETA HCG QUANT (REF LAB): hCG Quant: <1 m[IU]/mL

## 2019-12-26 ENCOUNTER — Telehealth: Payer: Self-pay | Admitting: Adult Health

## 2019-12-26 NOTE — Telephone Encounter (Signed)
Pt requesting a call to discuss her lab results.  

## 2019-12-27 NOTE — Telephone Encounter (Signed)
Telephoned patient at home number and left message.  

## 2019-12-28 NOTE — Telephone Encounter (Signed)
Telephoned patient at home number and discussed lab results. Patient still having some bleeding/tissue. Patient will call back to schedule follow up appointment.

## 2020-02-06 ENCOUNTER — Other Ambulatory Visit: Payer: Self-pay | Admitting: Adult Health

## 2020-03-05 ENCOUNTER — Other Ambulatory Visit: Payer: Self-pay | Admitting: Adult Health

## 2020-03-07 ENCOUNTER — Encounter: Payer: Self-pay | Admitting: Women's Health

## 2020-03-07 ENCOUNTER — Ambulatory Visit: Payer: Medicaid Other | Admitting: Women's Health

## 2020-03-07 ENCOUNTER — Other Ambulatory Visit (HOSPITAL_COMMUNITY)
Admission: RE | Admit: 2020-03-07 | Discharge: 2020-03-07 | Disposition: A | Discharge status: Home / Self Care | Payer: Medicaid Other | Source: Ambulatory Visit | Attending: Obstetrics and Gynecology | Admitting: Obstetrics and Gynecology

## 2020-03-07 VITALS — BP 111/67 | HR 78 | Ht 61.0 in | Wt 107.0 lb

## 2020-03-07 DIAGNOSIS — Z3041 Encounter for surveillance of contraceptive pills: Secondary | ICD-10-CM

## 2020-03-07 DIAGNOSIS — Z124 Encounter for screening for malignant neoplasm of cervix: Secondary | ICD-10-CM

## 2020-03-07 DIAGNOSIS — Z0000 Encounter for general adult medical examination without abnormal findings: Secondary | ICD-10-CM

## 2020-03-07 DIAGNOSIS — Z01419 Encounter for gynecological examination (general) (routine) without abnormal findings: Secondary | ICD-10-CM

## 2020-03-07 MED ORDER — LO LOESTRIN FE 1 MG-10 MCG / 10 MCG PO TABS: 1.0000 | ORAL_TABLET | Freq: Every day | ORAL | 11 refills | Status: DC

## 2020-03-07 NOTE — Progress Notes (Signed)
   WELL-WOMAN EXAMINATION Patient name: Jacqueline Ellison MRN [id number]  Date of birth: Aug 25, 1996 Chief Complaint:   No chief complaint on file.  History of Present Illness:   Jacqueline Ellison is a 24 y.o. G50P1001 Caucasian female being seen today for a routine well-woman exam.  Current complaints: none, needs refill on COCs  Depression screen [facility] 2/9 03/07/2020 10/08/2016  Decreased Interest 0 0  Down, Depressed, Hopeless 0 0  PHQ - 2 Score 0 0     PCP: [facility]      does not desire labs No LMP recorded. (Menstrual status: Oral contraceptives). The current method of family planning is OCP (estrogen/progesterone).  Last pap >5yrs ago. Results were: normal. H/O abnormal pap: no Last mammogram: never. Results were: N/A. Family h/o breast cancer: no Last colonoscopy: never. Results were: N/A. Family h/o colorectal cancer: no Review of Systems:   Pertinent items are noted in HPI Denies any headaches, blurred vision, fatigue, shortness of breath, chest pain, abdominal pain, abnormal vaginal discharge/itching/odor/irritation, problems with periods, bowel movements, urination, or intercourse unless otherwise stated above. Pertinent History Reviewed:  Reviewed past medical,surgical, social and family history.  Reviewed problem list, medications and allergies. Physical Assessment:   Vitals:   03/07/20 0837  BP: 111/67  Pulse: 78  Weight: 107 lb (48.5 kg)  Height: 5\' 1"  (1.549 m)  Body mass index is 20.22 kg/m.        Physical Examination:   General appearance - well appearing, and in no distress  Mental status - alert, oriented to person, place, and time  Psych:  She has a normal mood and affect  Skin - warm and dry, normal color, no suspicious lesions noted  Chest - effort normal, all lung fields clear to auscultation bilaterally  Heart - normal rate and regular rhythm  Neck:  midline trachea, no thyromegaly or nodules  Breasts - breasts appear normal, no suspicious masses, no  skin or nipple changes or  axillary nodes  Abdomen - soft, nontender, nondistended, no masses or organomegaly  Pelvic - VULVA: normal appearing vulva with no masses, tenderness or lesions  VAGINA: normal appearing vagina with normal color and discharge, no lesions  CERVIX: normal appearing cervix without discharge or lesions, no CMT  Thin prep pap is done w/ HR HPV cotesting  UTERUS: uterus is felt to be normal size, shape, consistency and nontender   ADNEXA: No adnexal masses or tenderness noted.  Extremities:  No swelling or varicosities noted  Chaperone: Peggy Dones    No results found for this or any previous visit (from the past 24 hour(s)).  Assessment & Plan:  1) Well-Woman Exam  2) Contraception management>refill LoLo x 47yr  Labs/procedures today: pap  Mammogram @26yo  or sooner if problems Colonoscopy @26yo  or sooner if problems  No orders of the defined types were placed in this encounter.   Meds:  Meds ordered this encounter  Medications  . Norethindrone-Ethinyl Estradiol-Fe Biphas (LO LOESTRIN FE) 1 MG-10 MCG / 10 MCG tablet    Sig: Take 1 tablet by mouth daily.    Dispense:  28 tablet    Refill:  11    Tell pt needs appt for more refills    Order Specific Question:   Supervising Provider    Answer:   3yr [[id number]]    Follow-up: Return in about 1 year (around 03/07/2021) for Physical.  CNM, WHNP-BC 03/07/2020 8:56 AM

## 2020-03-09 LAB — CYTOLOGY - PAP
Chlamydia: NEGATIVE
Comment: NEGATIVE
Comment: NEGATIVE
Comment: NORMAL
Diagnosis: NEGATIVE
Diagnosis: REACTIVE
High risk HPV: NEGATIVE
Neisseria Gonorrhea: NEGATIVE

## 2020-06-15 ENCOUNTER — Ambulatory Visit: Payer: Medicaid Other | Admitting: Urology

## 2020-06-16 NOTE — Progress Notes (Signed)
Patient was no-show

## 2020-08-08 ENCOUNTER — Other Ambulatory Visit: Payer: Self-pay

## 2020-08-08 ENCOUNTER — Ambulatory Visit (INDEPENDENT_AMBULATORY_CARE_PROVIDER_SITE_OTHER): Payer: Medicaid Other | Admitting: Urology

## 2020-08-08 ENCOUNTER — Encounter: Payer: Self-pay | Admitting: Urology

## 2020-08-08 VITALS — BP 96/57 | HR 76 | Temp 98.6°F | Ht 61.0 in | Wt 105.0 lb

## 2020-08-08 DIAGNOSIS — N390 Urinary tract infection, site not specified: Secondary | ICD-10-CM | POA: Diagnosis not present

## 2020-08-08 LAB — URINALYSIS, ROUTINE W REFLEX MICROSCOPIC
Bilirubin, UA: NEGATIVE
Glucose, UA: NEGATIVE
Nitrite, UA: NEGATIVE
Protein,UA: NEGATIVE
Specific Gravity, UA: 1.025 (ref 1.005–1.030)
Urobilinogen, Ur: 0.2 mg/dL (ref 0.2–1.0)
pH, UA: 6.5 (ref 5.0–7.5)

## 2020-08-08 LAB — MICROSCOPIC EXAMINATION

## 2020-08-08 NOTE — Addendum Note (Signed)
Addended by: Filomena Jungling on: 08/08/2020 11:23 AM   Modules accepted: Orders

## 2020-08-08 NOTE — Progress Notes (Signed)
08/08/2020 11:01 AM   [name] [birth year] [id number]  Referring provider: Loretha Brasil, DO No address on file  Frequent UTI  HPI: Ms Crespo is a 24yo here for evaluation of frequent UTI. She had a miscarriage in 12/2019 and since then she has been having a feeling of a UTI. She was treated with cipro, bactrim, keflex, doxycycline which failed to improve her urinary urgency, frequency, dysuria, and foul smelling urine. No hx of nephrolithiasis. She has a family hx of nephrolithiasis. She denies any gross hematuria.   She has a hx of bladder stretching when she has 24 years old. She has a hx of c section and hernia repair in 2017. Before 12/2019 she gets 1 UTI per year.    PMH: Past Medical History:  Diagnosis Date  . Anxiety   . Arthritis   . Chlamydia infection   . Hepatitis C 07/16/2017  . Mental disorder    depression    Surgical History: Past Surgical History:  Procedure Laterality Date  . BLADDER SURGERY     bladder stretch 2001?  . CESAREAN SECTION N/A 04/25/2016   Procedure: CESAREAN SECTION;  Surgeon: Reva Bores, MD;  Location: [facility] BIRTHING SUITES;  Service: Obstetrics;  Laterality: N/A;  . HERNIA REPAIR    . HERNIA REPAIR  2009  . WISDOM TOOTH EXTRACTION  2009    Home Medications:  Allergies as of 08/08/2020      Reactions   Depo-provera [medroxyprogesterone Acetate] Other (See Comments)   Broke out with acne all over body.   Tylenol [acetaminophen] Nausea And Vomiting      Medication List       Accurate as of August 08, 2020 11:01 AM. If you have any questions, ask your nurse or doctor.        STOP taking these medications   buprenorphine-naloxone 8-2 mg Subl SL tablet Commonly known as: SUBOXONE Stopped by: Wilkie Aye, MD     TAKE these medications   ALPRAZolam 0.5 MG tablet Commonly known as: XANAX Take 0.5 mg by mouth at bedtime as needed for anxiety.   AZO-STANDARD PO Take by mouth.   Lo Loestrin Fe 1 MG-10 MCG / 10  MCG tablet Generic drug: Norethindrone-Ethinyl Estradiol-Fe Biphas Take 1 tablet by mouth daily.   loratadine 10 MG tablet Commonly known as: CLARITIN Take by mouth.       Allergies:  Allergies  Allergen Reactions  . Depo-Provera [Medroxyprogesterone Acetate] Other (See Comments)    Broke out with acne all over body.  . Tylenol [Acetaminophen] Nausea And Vomiting    Family History: Family History  Problem Relation Age of Onset  . Hypertension Mother   . Diabetes Mother   . Hypertension Father   . Hypertension Sister   . Hypertension Maternal Grandmother   . Diabetes Maternal Grandmother   . Hypertension Maternal Grandfather   . Aneurysm Maternal Grandfather   . Hypertension Paternal Grandmother   . Hypertension Paternal Grandfather   . Heart disease Paternal Grandfather   . Heart attack Maternal Uncle     Social History:  reports that she has been smoking cigarettes. She has a 5.00 pack-year smoking history. She has never used smokeless tobacco. She reports that she does not drink alcohol and does not use drugs.  ROS: All other review of systems were reviewed and are negative except what is noted above in HPI  Physical Exam: BP (!) 96/57   Pulse 76   Temp 98.6 F (37 C)  Ht 5\' 1"  (1.549 m)   Wt 105 lb (47.6 kg)   BMI 19.84 kg/m   Constitutional:  Alert and oriented, No acute distress. HEENT: [place] AT, moist mucus membranes.  Trachea midline, no masses. Cardiovascular: No clubbing, cyanosis, or edema. Respiratory: Normal respiratory effort, no increased work of breathing. GI: Abdomen is soft, nontender, nondistended, no abdominal masses GU: No CVA tenderness.  Lymph: No cervical or inguinal lymphadenopathy. Skin: No rashes, bruises or suspicious lesions. Neurologic: Grossly intact, no focal deficits, moving all 4 extremities. Psychiatric: Normal mood and affect.  Laboratory Data: Lab Results  Component Value Date   WBC 6.5 06/04/2017   HGB 13.1 06/04/2017    HCT 39.0 06/04/2017   MCV 84.6 06/04/2017   PLT 256 06/04/2017    Lab Results  Component Value Date   CREATININE 0.60 02/19/2016    No results found for: PSA  No results found for: TESTOSTERONE  No results found for: HGBA1C  Urinalysis    Component Value Date/Time   COLORURINE YELLOW 04/25/2016 1415   APPEARANCEUR CLEAR 04/25/2016 1415   APPEARANCEUR Cloudy (A) 10/03/2015 1002   LABSPEC 1.015 04/25/2016 1415   PHURINE 8.0 04/25/2016 1415   GLUCOSEU NEGATIVE 04/25/2016 1415   HGBUR NEGATIVE 04/25/2016 1415   BILIRUBINUR NEGATIVE 04/25/2016 1415   BILIRUBINUR Negative 10/03/2015 1002   KETONESUR NEGATIVE 04/25/2016 1415   PROTEINUR NEGATIVE 04/25/2016 1415   UROBILINOGEN 0.2 03/22/2014 1934   NITRITE NEGATIVE 04/25/2016 1415   LEUKOCYTESUR NEGATIVE 04/25/2016 1415   LEUKOCYTESUR Negative 10/03/2015 1002    Lab Results  Component Value Date   LABMICR Comment 10/03/2015   BACTERIA RARE (A) 02/19/2016    Pertinent Imaging:  No results found for this or any previous visit.  No results found for this or any previous visit.  No results found for this or any previous visit.  No results found for this or any previous visit.  No results found for this or any previous visit.  No results found for this or any previous visit.  No results found for this or any previous visit.  No results found for this or any previous visit.   Assessment & Plan:    1. Recurrent UTI -BMP -CT hematuria -RTC 2 weeks - BLADDER SCAN AMB NON-IMAGING - Urinalysis, Routine w reflex microscopic   No follow-ups on file.  04/20/2016, MD  [facility] [place]

## 2020-08-08 NOTE — Patient Instructions (Signed)

## 2020-08-08 NOTE — Progress Notes (Signed)
Bladder Scan Patient cannot void: 2 ml Performed By: Bridgette Habermann, LPN   Urological Symptom Review  Patient is experiencing the following symptoms: Frequent urination Hard to postpone urination Burning/pain with urination Get up at night to urinate Leakage of urine Stream starts and stops Blood in urine Urinary tract infection Vaginal bleeding (female only) Painful intercourse   Review of Systems  Gastrointestinal (upper)  : Nausea  Gastrointestinal (lower) : Negative for lower GI symptoms  Constitutional : Weight loss Fatigue  Skin: Negative for skin symptoms  Eyes: Negative for eye symptoms  Ear/Nose/Throat : Sinus problems  Hematologic/Lymphatic: Easy bruising  Cardiovascular : Chest pain  Respiratory : Negative for respiratory symptoms  Endocrine: Excessive thirst  Musculoskeletal: Back/joint pain  Neurological: Negative for neurological symptoms  Psychologic: Anxiety

## 2020-08-09 LAB — BASIC METABOLIC PANEL
BUN/Creatinine Ratio: 15 (ref 9–23)
BUN: 11 mg/dL (ref 6–20)
CO2: 26 mmol/L (ref 20–29)
Calcium: 10 mg/dL (ref 8.7–10.2)
Chloride: 101 mmol/L (ref 96–106)
Creatinine, Ser: 0.74 mg/dL (ref 0.57–1.00)
GFR calc Af Amer: 129 mL/min/{1.73_m2} (ref >59)
GFR calc non Af Amer: 112 mL/min/{1.73_m2} (ref >59)
Glucose: 90 mg/dL (ref 65–99)
Potassium: 4.9 mmol/L (ref 3.5–5.2)
Sodium: 137 mmol/L (ref 134–144)

## 2020-08-29 ENCOUNTER — Other Ambulatory Visit: Payer: Self-pay

## 2020-08-29 ENCOUNTER — Ambulatory Visit (HOSPITAL_COMMUNITY)
Admission: RE | Admit: 2020-08-29 | Discharge: 2020-08-29 | Disposition: A | Discharge status: Home / Self Care | Payer: Medicaid Other | Source: Ambulatory Visit | Attending: Urology | Admitting: Urology

## 2020-08-29 DIAGNOSIS — N390 Urinary tract infection, site not specified: Secondary | ICD-10-CM | POA: Diagnosis not present

## 2020-08-29 MED ORDER — IOHEXOL 300 MG/ML  SOLN
125.0000 mL | Freq: Once | INTRAMUSCULAR | Status: AC | PRN
  Administered 2020-08-29: 13:00:00 100 mL via INTRAVENOUS

## 2020-08-31 ENCOUNTER — Ambulatory Visit: Payer: Medicaid Other | Admitting: Urology

## 2020-09-17 ENCOUNTER — Telehealth: Payer: Self-pay | Admitting: Urology

## 2020-09-17 NOTE — Telephone Encounter (Signed)
Pt states she was told she would be scheduled for surgery this week but hasnt heard anything. She asked for a nurse to call her.

## 2020-09-18 ENCOUNTER — Telehealth: Payer: Self-pay

## 2020-09-18 NOTE — Telephone Encounter (Signed)
Last OV from November has not notation about surgery.

## 2020-09-24 NOTE — Telephone Encounter (Signed)
Pt scheduled  

## 2020-10-05 ENCOUNTER — Ambulatory Visit: Payer: Medicaid Other | Admitting: Urology

## 2020-10-22 ENCOUNTER — Ambulatory Visit: Payer: Medicaid Other | Admitting: Urology

## 2020-10-23 ENCOUNTER — Ambulatory Visit: Payer: Medicaid Other | Admitting: Urology

## 2020-11-22 NOTE — Progress Notes (Signed)
This encounter was created in error - please disregard.  This encounter was created in error - please disregard.

## 2021-01-03 ENCOUNTER — Telehealth: Payer: Self-pay | Admitting: Women's Health

## 2021-01-03 NOTE — Telephone Encounter (Signed)
Returned patient's call.  Patient states she fell last night and went to [facility] but was not able to receive an u/s to check on the baby.  States she is about 4-[redacted] weeks pregnant.  Informed that at this gestation, the uterus is still very well protected by the pelvis and visualization of all parts including a heart beat may not be seen which would result in needing a repeat u/s in 10-14 days.  Patient stated she did not care if everything was not seen, she just wanted the u/s.  I again reiterated that an u/s this early was not indicated.  Pt hung up.

## 2021-01-03 NOTE — Telephone Encounter (Signed)
Pt went to [facility] ED last night after a fall (She had a + home preg test & is scheduled here 5/2 to confirm)  Pt states they told her she needed to be seen today by her OB due to history of previous miscarriage, states they told her that they didn't have equipment to do ultrasound & was unable to hear a hearbeat (pt already called out from work today to be seen)  Explained to pt that we do not have any available appointments with a provider or ultrasound, please review records & call pt

## 2021-01-14 ENCOUNTER — Other Ambulatory Visit (INDEPENDENT_AMBULATORY_CARE_PROVIDER_SITE_OTHER): Payer: Medicaid Other

## 2021-01-14 ENCOUNTER — Other Ambulatory Visit: Payer: Self-pay

## 2021-01-14 VITALS — BP 93/59 | HR 106 | Ht 61.0 in | Wt 103.2 lb

## 2021-01-14 DIAGNOSIS — Z3201 Encounter for pregnancy test, result positive: Secondary | ICD-10-CM | POA: Diagnosis not present

## 2021-01-14 DIAGNOSIS — N926 Irregular menstruation, unspecified: Secondary | ICD-10-CM | POA: Diagnosis not present

## 2021-01-14 LAB — POCT URINE PREGNANCY: Preg Test, Ur: POSITIVE — AB

## 2021-01-14 MED ORDER — DOXYLAMINE-PYRIDOXINE 10-10 MG PO TBEC: DELAYED_RELEASE_TABLET | ORAL | 6 refills | Status: DC

## 2021-01-14 NOTE — Progress Notes (Addendum)
   NURSE VISIT- PREGNANCY CONFIRMATION   SUBJECTIVE:  Jacqueline Ellison is a 25 y.o. G66P1001 female at Unknown by uncertain LMP of No LMP recorded (lmp unknown). Patient is pregnant. Here for pregnancy confirmation.  Home pregnancy test: positive x 15  She reports nausea and vomiting.  She is not taking prenatal vitamins.    OBJECTIVE:  BP (!) 93/59 (BP Location: Right Arm, Patient Position: Sitting, Cuff Size: Normal)   Pulse (!) 106   Ht 5\' 1"  (1.549 m)   Wt 103 lb 3.2 oz (46.8 kg)   LMP  (LMP Unknown)   BMI 19.50 kg/m   Appears well, in no apparent distress OB History  Gravida Para Term Preterm AB Living  2 1 1     1   SAB IAB Ectopic Multiple Live Births        0 1    # Outcome Date GA Lbr Len/2nd Weight Sex Delivery Anes PTL Lv  2 Current           1 Term 04/25/16 [redacted]w[redacted]d  6 lb 6.1 oz (2.895 kg) F CS-LTranv Spinal  LIV    Results for orders placed or performed in visit on 01/14/21 (from the past 24 hour(s))  POCT urine pregnancy   Collection Time: 01/14/21 11:45 AM  Result Value Ref Range   Preg Test, Ur Positive (A) Negative    ASSESSMENT: Positive pregnancy test, Unknown by LMP    PLAN: Schedule for dating ultrasound in 2 weeks Prenatal vitamins: plans to begin OTC ASAP   Nausea medicines: requested-note routed to 03/16/21 to send prescription   OB packet given: Yes  Biagio Snelson A Kalmen Lollar  01/14/2021 11:54 AM   Chart reviewed for nurse visit. Agree with plan of care. Rx diclegis.  Joellyn Haff, 03/16/2021 01/14/2021 12:04 PM

## 2021-01-14 NOTE — Addendum Note (Signed)
Addended by: Shawna Clamp R on: 01/14/2021 12:04 PM   Modules accepted: Orders

## 2021-02-04 ENCOUNTER — Other Ambulatory Visit: Payer: Self-pay

## 2021-02-04 ENCOUNTER — Ambulatory Visit (INDEPENDENT_AMBULATORY_CARE_PROVIDER_SITE_OTHER): Payer: Medicaid Other

## 2021-02-04 ENCOUNTER — Other Ambulatory Visit (INDEPENDENT_AMBULATORY_CARE_PROVIDER_SITE_OTHER): Payer: Medicaid Other | Admitting: *Deleted

## 2021-02-04 ENCOUNTER — Other Ambulatory Visit: Payer: Self-pay | Admitting: Obstetrics & Gynecology

## 2021-02-04 ENCOUNTER — Other Ambulatory Visit (HOSPITAL_COMMUNITY)
Admission: RE | Admit: 2021-02-04 | Discharge: 2021-02-04 | Disposition: A | Discharge status: Home / Self Care | Payer: Medicaid Other | Source: Ambulatory Visit | Attending: Obstetrics & Gynecology | Admitting: Obstetrics & Gynecology

## 2021-02-04 DIAGNOSIS — O3680X0 Pregnancy with inconclusive fetal viability, not applicable or unspecified: Secondary | ICD-10-CM

## 2021-02-04 DIAGNOSIS — N898 Other specified noninflammatory disorders of vagina: Secondary | ICD-10-CM

## 2021-02-04 MED ORDER — PROMETHAZINE HCL 25 MG PO TABS: 25.0000 mg | ORAL_TABLET | Freq: Four times a day (QID) | ORAL | 1 refills | Status: DC | PRN

## 2021-02-04 NOTE — Addendum Note (Signed)
Addended by: Cyril Mourning A on: 02/04/2021 12:20 PM   Modules accepted: Orders

## 2021-02-04 NOTE — Progress Notes (Signed)
Chart reviewed for nurse visit. Agree with plan of care. Will rx phenergan  Adline Potter, NP 02/04/2021 12:20 PM

## 2021-02-04 NOTE — Progress Notes (Signed)
   NURSE VISIT- VAGINITIS/STD  SUBJECTIVE:  Jacqueline Ellison is a 25 y.o. G2P1001 pregnantfemale here for a vaginal swab for vaginitis screening, STD screen.  She reports the following symptoms: vaginal itching and discharge with no odor for 4 days. Denies abnormal vaginal bleeding, significant pelvic pain, fever, or UTI symptoms.  OBJECTIVE:  LMP  (LMP Unknown)   Appears well, in no apparent distress  ASSESSMENT: Vaginal swab for vaginitis screening & STD screen.  PLAN: Self-collected vaginal probe for Gonorrhea, Chlamydia, Trichomonas, Bacterial Vaginosis, Yeast sent to lab Treatment: to be determined once results are received Follow-up as needed if symptoms persist/worsen, or new symptoms develop  [name]  02/04/2021 12:15 PM

## 2021-02-04 NOTE — Progress Notes (Signed)
[place] 11 wks,single IUP,FHR 176 BPM,anterior placenta,normal ovaries,subchorionic hemorrhage 2.6 x 1.6 x 2.7 cm,CRL 41.14 mm

## 2021-02-05 LAB — CERVICOVAGINAL ANCILLARY ONLY
Bacterial Vaginitis (gardnerella): NEGATIVE
Candida Glabrata: NEGATIVE
Candida Vaginitis: POSITIVE — AB
Chlamydia: NEGATIVE
Comment: NEGATIVE
Comment: NEGATIVE
Comment: NEGATIVE
Comment: NEGATIVE
Comment: NEGATIVE
Comment: NORMAL
Neisseria Gonorrhea: NEGATIVE
Trichomonas: NEGATIVE

## 2021-02-06 ENCOUNTER — Other Ambulatory Visit: Payer: Self-pay | Admitting: Adult Health

## 2021-02-21 ENCOUNTER — Ambulatory Visit: Payer: Medicaid Other | Admitting: *Deleted

## 2021-02-21 ENCOUNTER — Encounter: Payer: Medicaid Other | Admitting: Advanced Practice Midwife

## 2021-03-21 ENCOUNTER — Encounter: Payer: Self-pay | Admitting: *Deleted

## 2021-03-21 ENCOUNTER — Telehealth: Payer: Self-pay | Admitting: *Deleted

## 2021-03-21 NOTE — Telephone Encounter (Signed)
Patient states she is having abdominal pain along with constipation.  States she is not drinking any water.  Discussed with patient the need for water as this will help with the constipation and abdominal pain.  Constipation relief measures sent via mychart.  All questions answered and patient scheduled for new ob appt.

## 2021-04-03 DIAGNOSIS — Z3490 Encounter for supervision of normal pregnancy, unspecified, unspecified trimester: Secondary | ICD-10-CM | POA: Insufficient documentation

## 2021-04-10 ENCOUNTER — Other Ambulatory Visit: Payer: Self-pay | Admitting: Obstetrics & Gynecology

## 2021-04-10 DIAGNOSIS — Z363 Encounter for antenatal screening for malformations: Secondary | ICD-10-CM

## 2021-04-11 ENCOUNTER — Other Ambulatory Visit: Payer: Self-pay

## 2021-04-11 ENCOUNTER — Ambulatory Visit (INDEPENDENT_AMBULATORY_CARE_PROVIDER_SITE_OTHER): Payer: Medicaid Other

## 2021-04-11 ENCOUNTER — Encounter: Payer: Self-pay | Admitting: Obstetrics & Gynecology

## 2021-04-11 ENCOUNTER — Other Ambulatory Visit (HOSPITAL_COMMUNITY)
Admission: RE | Admit: 2021-04-11 | Discharge: 2021-04-11 | Disposition: A | Discharge status: Home / Self Care | Payer: Medicaid Other | Source: Ambulatory Visit | Attending: Obstetrics & Gynecology | Admitting: Obstetrics & Gynecology

## 2021-04-11 ENCOUNTER — Ambulatory Visit (INDEPENDENT_AMBULATORY_CARE_PROVIDER_SITE_OTHER): Payer: Medicaid Other | Admitting: Obstetrics & Gynecology

## 2021-04-11 VITALS — BP 104/64 | HR 111

## 2021-04-11 DIAGNOSIS — Z363 Encounter for antenatal screening for malformations: Secondary | ICD-10-CM | POA: Diagnosis not present

## 2021-04-11 DIAGNOSIS — Z113 Encounter for screening for infections with a predominantly sexual mode of transmission: Secondary | ICD-10-CM | POA: Diagnosis present

## 2021-04-11 DIAGNOSIS — Z3480 Encounter for supervision of other normal pregnancy, unspecified trimester: Secondary | ICD-10-CM | POA: Diagnosis present

## 2021-04-11 DIAGNOSIS — Z124 Encounter for screening for malignant neoplasm of cervix: Secondary | ICD-10-CM

## 2021-04-11 DIAGNOSIS — Z3A20 20 weeks gestation of pregnancy: Secondary | ICD-10-CM | POA: Diagnosis present

## 2021-04-11 DIAGNOSIS — O99332 Smoking (tobacco) complicating pregnancy, second trimester: Secondary | ICD-10-CM

## 2021-04-11 MED ORDER — PRENATAL VITAMINS 28-0.8 MG PO TABS: 1.0000 | ORAL_TABLET | Freq: Every day | ORAL | 4 refills | Status: DC

## 2021-04-11 MED ORDER — BLOOD PRESSURE MONITOR MISC: 0 refills | Status: DC

## 2021-04-11 MED ORDER — ONDANSETRON 8 MG PO TBDP: 8.0000 mg | ORAL_TABLET | Freq: Three times a day (TID) | ORAL | 2 refills | Status: AC | PRN

## 2021-04-11 NOTE — Progress Notes (Signed)
INITIAL OBSTETRICAL VISIT Patient name: Jacqueline Ellison MRN [id number]  Date of birth: 1996/06/04 Chief Complaint:   Initial Prenatal Visit  History of Present Illness:   Jacqueline Ellison is a 24 y.o. G15P1011 female at [redacted]w[redacted]d by  LMP  with an Estimated Date of Delivery: 08/26/21 being seen today for her initial obstetrical visit.   Her obstetrical history is significant for:  -Tobacco use- cut back from 2 packs/days to 1-2 cig per day -Anxiety- on xanax prn- working with [facility] to ween to alternative med -Prior C-section- Breech.  Pt reports incisional hernia s/p delivery requiring multiple surgeries/repairs and states it is still not fixed.  -Desires tubal ligation    Today she reports vaginal irritation- notes husband has been cheating.  Every time s/p IC will note itching, burning and irritation.  Per pt treated with Diflucan for yeast, but symptoms have returned   Depression screen [facility] 2/9 04/11/2021 03/07/2020 10/08/2016  Decreased Interest 0 0 0  Down, Depressed, Hopeless 0 0 0  PHQ - 2 Score 0 0 0  Altered sleeping 0 - -  Tired, decreased energy 2 - -  Change in appetite 2 - -  Feeling bad or failure about yourself  0 - -  Trouble concentrating 2 - -  Moving slowly or fidgety/restless 0 - -  Suicidal thoughts 0 - -  PHQ-9 Score 6 - -    No LMP recorded (lmp unknown). Patient is pregnant. Last pap 2021 Results were: NILM w/ HRHPV negative Review of Systems:   Pertinent items are noted in HPI Denies cramping/contractions, leakage of fluid, vaginal bleeding, visual changes, shortness of breath, chest pain, abdominal pain, severe nausea/vomiting, or problems with urination or bowel movements unless otherwise stated above.  Pertinent History Reviewed:  Reviewed past medical,surgical, social, obstetrical and family history.  Reviewed problem list, medications and allergies. OB History  Gravida Para Term Preterm AB Living  3 1 1   1 1   SAB IAB Ectopic Multiple Live Births   1     0 1    # Outcome Date GA Lbr Len/2nd Weight Sex Delivery Anes PTL Lv  3 Current           2 SAB 12/2019          1 Term 04/25/16 [redacted]w[redacted]d  6 lb 6.1 oz (2.895 kg) F CS-LTranv Spinal N LIV     Complications: Breech presentation   Physical Assessment:   Vitals:   04/11/21 1436  BP: 104/64  Pulse: (!) 111  There is no height or weight on file to calculate BMI.       Physical Examination:  General appearance - well appearing, and in no distress  Mental status - alert, oriented to person, place, and time  Psych:  She has a normal mood and affect  Skin - warm and dry, normal color, no suspicious lesions noted  Chest - effort normal, all lung fields clear to auscultation bilaterally  Heart - normal rate and regular rhythm  Abdomen - soft, nontender  Extremities:  No swelling or varicosities noted  Pelvic - VULVA: normal appearing vulva with no masses, tenderness or lesions  VAGINA: normal appearing vagina with normal color and discharge- no abnormalities noted, no lesions  CERVIX: normal appearing cervix without discharge or lesions, no CMT   Chaperone: Peggy Dones     No results found for this or any previous visit (from the past 24 hour(s)).  Assessment & Plan:  1) Low-Risk Pregnancy  P6P9509 at [redacted]w[redacted]d with an Estimated Date of Delivery: 08/26/21   2) Initial OB visit  3) tobacco use- encouraged pt to continue to work on quitting 4) Anxiety- transitioning off xanax with [facility] 5) Prior C-section- briefly discussed TOLAC and concern for recurrence of hernia/adhesions from multiple prior surgeries.  Plan to review again at next visit 6) Desires permanent sterilization- tubal paperwork to be completed  Meds:  Meds ordered this encounter  Medications   Blood Pressure Monitor MISC    Sig: For regular home bp monitoring during pregnancy    Dispense:  1 each    Refill:  0    Z34.82 Please mail to patient   Prenatal Vit-Fe Fumarate-FA (PRENATAL VITAMINS) 28-0.8 MG TABS     Sig: Take 1 tablet by mouth daily.    Dispense:  90 tablet    Refill:  4   ondansetron (ZOFRAN-ODT) 8 MG disintegrating tablet    Sig: Take 1 tablet (8 mg total) by mouth every 8 (eight) hours as needed for nausea or vomiting.    Dispense:  20 tablet    Refill:  2    Initial labs obtained Continue prenatal vitamins Reviewed n/v relief measures and warning s/s to report Reviewed recommended weight gain based on pre-gravid BMI Encouraged well-balanced diet Genetic & carrier screening discussed: declines Panorama, declines NT/IT Ultrasound discussed; fetal survey: requested CCNC completed> form faxed if has or is planning to apply for medicaid The nature of [facility] [employer] with multiple MDs and other Advanced Practice Providers was explained to patient; also emphasized that fellows, residents, and students are part of our team. She does not have home bp cuff. Rx faxed to pharmacy. Check bp weekly, let us know if >140/90.   Indications for ASA therapy (per uptodate) One of the following: H/O preeclampsia, especially early onset/adverse outcome No Multifetal gestation No CHTN No T1DM or T2DM No Chronic kidney disease No Autoimmune disease (antiphospholipid syndrome, systemic lupus erythematosus) No  OR Two or more of the following: Nulliparity No Obesity (BMI>30 kg/m2) No Family h/o preeclampsia in mother or sister No Age ?35 years No Sociodemographic characteristics (African American race, low socioeconomic level) Yes Personal risk factors (eg, previous pregnancy w/ LBW or SGA, previous adverse pregnancy outcome [eg, stillbirth], interval >10 years between pregnancies) No  Indications for early A1C (per uptodate) BMI >=25 (>=23 in Asian women) AND one of the following GDM in a previous pregnancy No Previous A1C?5.7, impaired glucose tolerance, or impaired fasting glucose on previous testing No First-degree relative with diabetes No High-risk  race/ethnicity (eg, African American, Latino, Native American, Asian American, Pacific Islander) No History of cardiovascular disease No HTN or on therapy for hypertension No HDL cholesterol level <35 mg/dL (3.26 mmol/L) and/or a triglyceride level >250 mg/dL (7.12 mmol/L) No PCOS No Physical inactivity No Other clinical condition associated with insulin resistance (eg, severe obesity, acanthosis nigricans) No Previous birth of an infant weighing ?4000 g No Previous stillbirth of unknown cause No >= 40yo No  Follow-up: Return in 4 weeks (on 05/09/2021) for LROB visit/anatomy scan follow up.   Orders Placed This Encounter  Procedures   GC/Chlamydia Probe Amp   Urine Culture   CBC/D/Plt+RPR+Rh+ABO+RubIgG...   Pain Management Screening Profile (10S)   POC Urinalysis Dipstick OB   Myna Hidalgo, DO Attending Obstetrician & Gynecologist, [facility] for [employer], [facility]

## 2021-04-11 NOTE — Progress Notes (Signed)
[place] 20+3 wks,cephalic,svp of fluid 5.5 cm,normal right ovary,left ovary not visualized,cx 2.9 cm,fhr 157 bpm,anterior placenta gr 0,small stomach bubble,EFW 344 g 37%,anatomy complete,pt will come back in 4 wk to ck stomach bubble,per Dr Charlotta Newton

## 2021-04-11 NOTE — Addendum Note (Signed)
Addended by: Moss Mc on: 04/11/2021 04:45 PM   Modules accepted: Orders

## 2021-04-15 LAB — CERVICOVAGINAL ANCILLARY ONLY
Bacterial Vaginitis (gardnerella): NEGATIVE
Candida Glabrata: NEGATIVE
Candida Vaginitis: NEGATIVE
Chlamydia: NEGATIVE
Comment: NEGATIVE
Comment: NEGATIVE
Comment: NEGATIVE
Comment: NEGATIVE
Comment: NEGATIVE
Comment: NORMAL
Neisseria Gonorrhea: NEGATIVE
Trichomonas: NEGATIVE

## 2021-04-18 ENCOUNTER — Encounter: Payer: Self-pay | Admitting: Obstetrics & Gynecology

## 2021-05-08 ENCOUNTER — Other Ambulatory Visit: Payer: Self-pay | Admitting: Obstetrics & Gynecology

## 2021-05-08 DIAGNOSIS — Z362 Encounter for other antenatal screening follow-up: Secondary | ICD-10-CM

## 2021-05-09 ENCOUNTER — Encounter: Payer: Medicaid Other | Admitting: Advanced Practice Midwife

## 2021-05-09 ENCOUNTER — Other Ambulatory Visit: Payer: Medicaid Other

## 2021-06-12 ENCOUNTER — Encounter: Payer: Self-pay | Admitting: Advanced Practice Midwife

## 2021-06-12 ENCOUNTER — Ambulatory Visit (INDEPENDENT_AMBULATORY_CARE_PROVIDER_SITE_OTHER): Payer: Medicaid Other

## 2021-06-12 ENCOUNTER — Ambulatory Visit (INDEPENDENT_AMBULATORY_CARE_PROVIDER_SITE_OTHER): Payer: Medicaid Other | Admitting: Advanced Practice Midwife

## 2021-06-12 ENCOUNTER — Other Ambulatory Visit: Payer: Self-pay

## 2021-06-12 ENCOUNTER — Other Ambulatory Visit (HOSPITAL_COMMUNITY)
Admission: RE | Admit: 2021-06-12 | Discharge: 2021-06-12 | Disposition: A | Discharge status: Home / Self Care | Payer: Medicaid Other | Source: Ambulatory Visit | Attending: Advanced Practice Midwife | Admitting: Advanced Practice Midwife

## 2021-06-12 VITALS — BP 95/64 | HR 83 | Wt 117.0 lb

## 2021-06-12 DIAGNOSIS — Z3A29 29 weeks gestation of pregnancy: Secondary | ICD-10-CM

## 2021-06-12 DIAGNOSIS — Z98891 History of uterine scar from previous surgery: Secondary | ICD-10-CM

## 2021-06-12 DIAGNOSIS — Z302 Encounter for sterilization: Secondary | ICD-10-CM | POA: Insufficient documentation

## 2021-06-12 DIAGNOSIS — Z1389 Encounter for screening for other disorder: Secondary | ICD-10-CM

## 2021-06-12 DIAGNOSIS — Z3480 Encounter for supervision of other normal pregnancy, unspecified trimester: Secondary | ICD-10-CM

## 2021-06-12 DIAGNOSIS — Z362 Encounter for other antenatal screening follow-up: Secondary | ICD-10-CM

## 2021-06-12 MED ORDER — ONDANSETRON 4 MG PO TBDP: 4.0000 mg | ORAL_TABLET | Freq: Three times a day (TID) | ORAL | 1 refills | Status: DC | PRN

## 2021-06-12 MED ORDER — PRENATAL VITAMINS 28-0.8 MG PO TABS: 1.0000 | ORAL_TABLET | Freq: Every day | ORAL | 4 refills | Status: AC

## 2021-06-12 MED ORDER — PANTOPRAZOLE SODIUM 20 MG PO TBEC: 20.0000 mg | DELAYED_RELEASE_TABLET | Freq: Two times a day (BID) | ORAL | 3 refills | Status: DC

## 2021-06-12 NOTE — Patient Instructions (Signed)
Brystol, thank you for choosing our office today! We appreciate the opportunity to meet your healthcare needs. You may receive a short survey by mail, e-mail, or through MyChart. If you are happy with your care we would appreciate if you could take just a few minutes to complete the survey questions. We read all of your comments and take your feedback very seriously. Thank you again for choosing our office.  [facility] ([address] [place], [place] [address]) Entrance C, located off of [address] Free 24/7 valet parking   CLASSES: Go to [email or web address] to register for classes (childbirth, breastfeeding, waterbirth, infant CPR, daddy bootcamp, etc.)  Call the office ([phone]) or go to [facility] if: You begin to have strong, frequent contractions Your water breaks.  Sometimes it is a big gush of fluid, sometimes it is just a trickle that keeps getting your panties wet or running down your legs You have vaginal bleeding.  It is normal to have a small amount of spotting if your cervix was checked.  You don't feel your baby moving like normal.  If you don't, get you something to eat and drink and lay down and focus on feeling your baby move.   If your baby is still not moving like normal, you should call the office or go to [facility].  Call the office ([phone]) or go to [facility] for these signs of pre-eclampsia: Severe headache that does not go away with Tylenol Visual changes- seeing spots, double, blurred vision Pain under your right breast or upper abdomen that does not go away with Tums or heartburn medicine Nausea and/or vomiting Severe swelling in your hands, feet, and face   Tdap Vaccine It is recommended that you get the Tdap vaccine during the third trimester of EACH pregnancy to help protect your baby from getting pertussis (whooping cough) 27-36 weeks is the BEST time to do  this so that you can pass the protection on to your baby. During pregnancy is better than after pregnancy, but if you are unable to get it during pregnancy it will be offered at the hospital.  You can get this vaccine with us, at the health department, your family doctor, or some local pharmacies Everyone who will be around your baby should also be up-to-date on their vaccines before the baby comes. Adults (who are not pregnant) only need 1 dose of Tdap during adulthood.   [place] Pediatricians/Family Doctors [facility] ([facility]): [address], [phone]           [facility]: [address], [phone]                [facility] ([facility]): [address], [phone] (call to ask if accepting patients) [facility]: [address], [place], [phone]    [place] Pediatricians/Family Doctors [facility] ([facility]): [address], [phone] [facility]: [address], [phone] [facility]: [address], [phone]  [place] [facility]  [facility] ([facility]): [phone] [facility]: [address], [phone]   [place] [facility]: [address], [phone]  [place] [facility]  [facility]: [address], [phone]  Home Blood Pressure Monitoring for Patients   Your provider has recommended that you check your   blood pressure (BP) at least once a week at home. If you do not have a blood pressure cuff at home, one will be provided for you. Contact your provider if you have not received your monitor within 1 week.   Helpful Tips for Accurate Home Blood Pressure Checks  Don't smoke, exercise, or drink caffeine 30 minutes before checking your BP Use the restroom before checking your BP (a full bladder can raise your  pressure) Relax in a comfortable upright chair Feet on the ground Left arm resting comfortably on a flat surface at the level of your heart Legs uncrossed Back supported Sit quietly and don't talk Place the cuff on your bare arm Adjust snuggly, so that only two fingertips can fit between your skin and the top of the cuff Check 2 readings separated by at least one minute Keep a log of your BP readings For a visual, please reference this diagram: http://ccnc.care/bpdiagram  Provider Name: [facility]     Phone: [phone]  Zone 1: ALL CLEAR  Continue to monitor your symptoms:  BP reading is less than 140 (top number) or less than 90 (bottom number)  No right upper stomach pain No headaches or seeing spots No feeling nauseated or throwing up No swelling in face and hands  Zone 2: CAUTION Call your doctor's office for any of the following:  BP reading is greater than 140 (top number) or greater than 90 (bottom number)  Stomach pain under your ribs in the middle or right side Headaches or seeing spots Feeling nauseated or throwing up Swelling in face and hands  Zone 3: EMERGENCY  Seek immediate medical care if you have any of the following:  BP reading is greater than160 (top number) or greater than 110 (bottom number) Severe headaches not improving with Tylenol Serious difficulty catching your breath Any worsening symptoms from Zone 2   Third Trimester of Pregnancy The third trimester is from week 29 through week 42, months 7 through 9. The third trimester is a time when the fetus is growing rapidly. At the end of the ninth month, the fetus is about 20 inches in length and weighs 6-10 pounds.  BODY CHANGES Your body goes through many changes during pregnancy. The changes vary from woman to woman.  Your weight will continue to increase. You can expect to gain 25-35 pounds (11-16 kg) by the end of the pregnancy. You may begin to get stretch marks on your hips, abdomen,  and breasts. You may urinate more often because the fetus is moving lower into your pelvis and pressing on your bladder. You may develop or continue to have heartburn as a result of your pregnancy. You may develop constipation because certain hormones are causing the muscles that push waste through your intestines to slow down. You may develop hemorrhoids or swollen, bulging veins (varicose veins). You may have pelvic pain because of the weight gain and pregnancy hormones relaxing your joints between the bones in your pelvis. Backaches may result from overexertion of the muscles supporting your posture. You may have changes in your hair. These can include thickening of your hair, rapid growth, and changes in texture. Some women also have hair loss during or after pregnancy, or hair that feels dry or thin. Your hair will most likely return to normal after your baby is born. Your breasts will continue to grow and be tender. A yellow discharge may leak from your breasts called colostrum. Your belly button may stick out. You may   feel short of breath because of your expanding uterus. You may notice the fetus "dropping," or moving lower in your abdomen. You may have a bloody mucus discharge. This usually occurs a few days to a week before labor begins. Your cervix becomes thin and soft (effaced) near your due date. WHAT TO EXPECT AT YOUR PRENATAL EXAMS  You will have prenatal exams every 2 weeks until week 36. Then, you will have weekly prenatal exams. During a routine prenatal visit: You will be weighed to make sure you and the fetus are growing normally. Your blood pressure is taken. Your abdomen will be measured to track your baby's growth. The fetal heartbeat will be listened to. Any test results from the previous visit will be discussed. You may have a cervical check near your due date to see if you have effaced. At around 36 weeks, your caregiver will check your cervix. At the same time, your  caregiver will also perform a test on the secretions of the vaginal tissue. This test is to determine if a type of bacteria, Group B streptococcus, is present. Your caregiver will explain this further. Your caregiver may ask you: What your birth plan is. How you are feeling. If you are feeling the baby move. If you have had any abnormal symptoms, such as leaking fluid, bleeding, severe headaches, or abdominal cramping. If you have any questions. Other tests or screenings that may be performed during your third trimester include: Blood tests that check for low iron levels (anemia). Fetal testing to check the health, activity level, and growth of the fetus. Testing is done if you have certain medical conditions or if there are problems during the pregnancy. FALSE LABOR You may feel small, irregular contractions that eventually go away. These are called Braxton Hicks contractions, or false labor. Contractions may last for hours, days, or even weeks before true labor sets in. If contractions come at regular intervals, intensify, or become painful, it is best to be seen by your caregiver.  SIGNS OF LABOR  Menstrual-like cramps. Contractions that are 5 minutes apart or less. Contractions that start on the top of the uterus and spread down to the lower abdomen and back. A sense of increased pelvic pressure or back pain. A watery or bloody mucus discharge that comes from the vagina. If you have any of these signs before the 37th week of pregnancy, call your caregiver right away. You need to go to the hospital to get checked immediately. HOME CARE INSTRUCTIONS  Avoid all smoking, herbs, alcohol, and unprescribed drugs. These chemicals affect the formation and growth of the baby. Follow your caregiver's instructions regarding medicine use. There are medicines that are either safe or unsafe to take during pregnancy. Exercise only as directed by your caregiver. Experiencing uterine cramps is a good sign to  stop exercising. Continue to eat regular, healthy meals. Wear a good support bra for breast tenderness. Do not use hot tubs, steam rooms, or saunas. Wear your seat belt at all times when driving. Avoid raw meat, uncooked cheese, cat litter boxes, and soil used by cats. These carry germs that can cause birth defects in the baby. Take your prenatal vitamins. Try taking a stool softener (if your caregiver approves) if you develop constipation. Eat more high-fiber foods, such as fresh vegetables or fruit and whole grains. Drink plenty of fluids to keep your urine clear or pale yellow. Take warm sitz baths to soothe any pain or discomfort caused by hemorrhoids. Use hemorrhoid cream if   your caregiver approves. If you develop varicose veins, wear support hose. Elevate your feet for 15 minutes, 3-4 times a day. Limit salt in your diet. Avoid heavy lifting, wear low heal shoes, and practice good posture. Rest a lot with your legs elevated if you have leg cramps or low back pain. Visit your dentist if you have not gone during your pregnancy. Use a soft toothbrush to brush your teeth and be gentle when you floss. A sexual relationship may be continued unless your caregiver directs you otherwise. Do not travel far distances unless it is absolutely necessary and only with the approval of your caregiver. Take prenatal classes to understand, practice, and ask questions about the labor and delivery. Make a trial run to the hospital. Pack your hospital bag. Prepare the baby's nursery. Continue to go to all your prenatal visits as directed by your caregiver. SEEK MEDICAL CARE IF: You are unsure if you are in labor or if your water has broken. You have dizziness. You have mild pelvic cramps, pelvic pressure, or nagging pain in your abdominal area. You have persistent nausea, vomiting, or diarrhea. You have a bad smelling vaginal discharge. You have pain with urination. SEEK IMMEDIATE MEDICAL CARE IF:  You  have a fever. You are leaking fluid from your vagina. You have spotting or bleeding from your vagina. You have severe abdominal cramping or pain. You have rapid weight loss or gain. You have shortness of breath with chest pain. You notice sudden or extreme swelling of your face, hands, ankles, feet, or legs. You have not felt your baby move in over an hour. You have severe headaches that do not go away with medicine. You have vision changes. Document Released: 08/26/2001 Document Revised: 09/06/2013 Document Reviewed: 11/02/2012 ExitCare Patient Information 2015 [employer]. This information is not intended to replace advice given to you by your health care provider. Make sure you discuss any questions you have with your health care provider.       

## 2021-06-12 NOTE — Progress Notes (Signed)
[place] 29+2 wks,cephalic,cx 3 cm,anterior placenta gr 1,AFI 19.8 cm,FHR 150 BPM,normal stomach bubble,EFW 1405 g 45%

## 2021-06-12 NOTE — Progress Notes (Signed)
LOW-RISK PREGNANCY VISIT Patient name: Jacqueline Ellison MRN [id number]  Date of birth: 04-05-1996 Chief Complaint:   Routine Prenatal Visit (Ultrasound / swab/ want zofran  and prenatal vitamin)  History of Present Illness:   Jacqueline Ellison is a 25 y.o. G77P1011 female at [redacted]w[redacted]d with an Estimated Date of Delivery: 08/26/21 being seen today for ongoing management of a low-risk pregnancy.  Today she reports  husband has been unfaithful with multiple women and she would like screening due to vag irritation; having heartburn; has been having transportation issues; would like to plan for a repeat C/S and salpingectomy . Contractions: Irritability.  .  Movement: Present. denies leaking of fluid. Review of Systems:   Pertinent items are noted in HPI Denies abnormal vaginal discharge w/ itching/odor/irritation, headaches, visual changes, shortness of breath, chest pain, abdominal pain, severe nausea/vomiting, or problems with urination or bowel movements unless otherwise stated above. Pertinent History Reviewed:  Reviewed past medical,surgical, social, obstetrical and family history.  Reviewed problem list, medications and allergies. Physical Assessment:   Vitals:   06/12/21 1608  BP: 95/64  Pulse: 83  Weight: 117 lb (53.1 kg)  Body mass index is 22.11 kg/m.        Physical Examination:   General appearance: Well appearing, and in no distress  Mental status: Alert, oriented to person, place, and time  Skin: Warm & dry  Cardiovascular: Normal heart rate noted  Respiratory: Normal respiratory effort, no distress  Abdomen: Soft, gravid, nontender  Pelvic: Cervical exam deferred         Extremities: Edema: None  Fetal Status: Fetal Heart Rate (bpm): 150 u/s   Movement: Present    F/U anatomy scan to eval for small stomach bubble: [place] 29+2 wks,cephalic,cx 3 cm,anterior placenta gr 1,AFI 19.8 cm,FHR 150 BPM,normal stomach bubble,EFW 1405 g 45%,FL 3%  No results found for this or any  previous visit (from the past 24 hour(s)).  Assessment & Plan:  1) Low-risk pregnancy G3P1011 at [redacted]w[redacted]d with an Estimated Date of Delivery: 08/26/21   2) Previous C/S, want an elective repeat with salpingectomy (sign 30d papers at next visit)  3) Prev smoker, quit @ 20wks  4) Anxiety, taking Xanax 0.5mg  bid/tid  5) Limited care, has had transportation issues; didn't get NOB bloodwork last time- will get with 2hr GTT ASAP   Meds:  Meds ordered this encounter  Medications   pantoprazole (PROTONIX) 20 MG tablet    Sig: Take 1 tablet (20 mg total) by mouth 2 (two) times daily before a meal.    Dispense:  60 tablet    Refill:  3    Order Specific Question:   Supervising Provider    Answer:   Despina Hidden, LUTHER H [[id number]]   ondansetron (ZOFRAN ODT) 4 MG disintegrating tablet    Sig: Take 1 tablet (4 mg total) by mouth every 8 (eight) hours as needed for nausea or vomiting.    Dispense:  30 tablet    Refill:  1    Order Specific Question:   Supervising Provider    Answer:   Duane Lope H [[id number]]   Prenatal Vit-Fe Fumarate-FA (PRENATAL VITAMINS) 28-0.8 MG TABS    Sig: Take 1 tablet by mouth daily.    Dispense:  90 tablet    Refill:  4    Order Specific Question:   Supervising Provider    Answer:   Lazaro Arms [[id number]]   Labs/procedures today: none  Plan:  Continue routine obstetrical care  Reviewed: Preterm labor symptoms and general obstetric precautions including but not limited to vaginal bleeding, contractions, leaking of fluid and fetal movement were reviewed in detail with the patient.  All questions were answered. Has home bp cuff. Check bp weekly, let us know if >140/90.   Follow-up: Return for 2hr GTT ASAP (will get other NOB labs too); 2wk LROB inperson.  No orders of the defined types were placed in this encounter.  Arabella Merles CNM 06/12/2021 4:41 PM

## 2021-06-14 LAB — CERVICOVAGINAL ANCILLARY ONLY
Chlamydia: NEGATIVE
Comment: NEGATIVE
Comment: NORMAL
Neisseria Gonorrhea: NEGATIVE

## 2021-06-17 ENCOUNTER — Other Ambulatory Visit: Payer: Medicaid Other

## 2021-06-17 DIAGNOSIS — Z3A30 30 weeks gestation of pregnancy: Secondary | ICD-10-CM

## 2021-06-19 ENCOUNTER — Other Ambulatory Visit: Payer: Self-pay | Admitting: Obstetrics & Gynecology

## 2021-06-19 DIAGNOSIS — O2343 Unspecified infection of urinary tract in pregnancy, third trimester: Secondary | ICD-10-CM

## 2021-06-19 LAB — PMP SCREEN PROFILE (10S), URINE
Amphetamine Scrn, Ur: NEGATIVE ng/mL
BARBITURATE SCREEN URINE: NEGATIVE ng/mL
BENZODIAZEPINE SCREEN, URINE: POSITIVE ng/mL — AB
CANNABINOIDS UR QL SCN: NEGATIVE ng/mL
Cocaine (Metab) Scrn, Ur: NEGATIVE ng/mL
Creatinine(Crt), U: 90.6 mg/dL (ref 20.0–300.0)
Methadone Screen, Urine: NEGATIVE ng/mL
OXYCODONE+OXYMORPHONE UR QL SCN: NEGATIVE ng/mL
Opiate Scrn, Ur: NEGATIVE ng/mL
Ph of Urine: 6.9 (ref 4.5–8.9)
Phencyclidine Qn, Ur: NEGATIVE ng/mL
Propoxyphene Scrn, Ur: NEGATIVE ng/mL

## 2021-06-19 LAB — URINE CULTURE

## 2021-06-19 LAB — SPECIMEN STATUS REPORT

## 2021-06-19 MED ORDER — CEPHALEXIN 500 MG PO CAPS: 500.0000 mg | ORAL_CAPSULE | Freq: Two times a day (BID) | ORAL | 0 refills | Status: AC

## 2021-06-19 NOTE — Progress Notes (Signed)
Rx for UTi sent in

## 2021-06-26 ENCOUNTER — Encounter: Payer: Self-pay | Admitting: Women's Health

## 2021-06-26 ENCOUNTER — Ambulatory Visit (INDEPENDENT_AMBULATORY_CARE_PROVIDER_SITE_OTHER): Payer: Medicaid Other | Admitting: Women's Health

## 2021-06-26 ENCOUNTER — Other Ambulatory Visit: Payer: Self-pay

## 2021-06-26 ENCOUNTER — Other Ambulatory Visit (HOSPITAL_COMMUNITY)
Admission: RE | Admit: 2021-06-26 | Discharge: 2021-06-26 | Disposition: A | Discharge status: Home / Self Care | Payer: Medicaid Other | Source: Ambulatory Visit | Attending: Women's Health | Admitting: Women's Health

## 2021-06-26 VITALS — BP 113/65 | HR 106 | Wt 118.0 lb

## 2021-06-26 DIAGNOSIS — Z23 Encounter for immunization: Secondary | ICD-10-CM | POA: Diagnosis not present

## 2021-06-26 DIAGNOSIS — Z3483 Encounter for supervision of other normal pregnancy, third trimester: Secondary | ICD-10-CM | POA: Insufficient documentation

## 2021-06-26 DIAGNOSIS — N898 Other specified noninflammatory disorders of vagina: Secondary | ICD-10-CM | POA: Diagnosis present

## 2021-06-26 DIAGNOSIS — O26893 Other specified pregnancy related conditions, third trimester: Secondary | ICD-10-CM

## 2021-06-26 DIAGNOSIS — Z302 Encounter for sterilization: Secondary | ICD-10-CM

## 2021-06-26 DIAGNOSIS — Z3480 Encounter for supervision of other normal pregnancy, unspecified trimester: Secondary | ICD-10-CM

## 2021-06-26 NOTE — Patient Instructions (Signed)
Zollie Scale, thank you for choosing our office today! We appreciate the opportunity to meet your healthcare needs. You may receive a short survey by mail, e-mail, or through [employer]. If you are happy with your care we would appreciate if you could take just a few minutes to complete the survey questions. We read all of your comments and take your feedback very seriously. Thank you again for choosing our office.  Center for [employer] Team at [facility]  [facility] at [facility] ([address] [place], [place] [address]) Entrance C, located off of [address] Free 24/7 valet parking   You will have your sugar test next visit.  Please do not eat or drink anything after midnight the night before you come, not even water.  You will be here for at least two hours.  Please make an appointment online for the bloodwork at [email or web address] for 8:30am (or as close to this as possible). Make sure you select the [facility] service center. The day of the appointment, check in with our office first, then you will go to [facility] to start the sugar test.    CLASSES: Go to [email or web address] to register for classes (childbirth, breastfeeding, waterbirth, infant CPR, daddy bootcamp, etc.)  Call the office [phone]) or go to [facility] if: You begin to have strong, frequent contractions Your water breaks.  Sometimes it is a big gush of fluid, sometimes it is just a trickle that keeps getting your panties wet or running down your legs You have vaginal bleeding.  It is normal to have a small amount of spotting if your cervix was checked.  You don't feel your baby moving like normal.  If you don't, get you something to eat and drink and lay down and focus on feeling your baby move.   If your baby is still not moving like normal, you should call the office or go to [facility].  Call the office [phone]) or go to [facility] hospital for these signs of pre-eclampsia: Severe headache that does not  go away with Tylenol Visual changes- seeing spots, double, blurred vision Pain under your right breast or upper abdomen that does not go away with Tums or heartburn medicine Nausea and/or vomiting Severe swelling in your hands, feet, and face   Tdap Vaccine It is recommended that you get the Tdap vaccine during the third trimester of EACH pregnancy to help protect your baby from getting pertussis (whooping cough) 27-36 weeks is the BEST time to do this so that you can pass the protection on to your baby. During pregnancy is better than after pregnancy, but if you are unable to get it during pregnancy it will be offered at the hospital.  You can get this vaccine with [place], at the health department, your family doctor, or some local pharmacies Everyone who will be around your baby should also be up-to-date on their vaccines before the baby comes. Adults (who are not pregnant) only need 1 dose of Tdap during adulthood.   [facility] Pediatricians/Family Doctors [facility]): [address] Colette Ribas, [phone]           [facility]: [address], [phone]                [facility]): [address], [phone] (call to ask if accepting patients) [facility] Department: [address], [place], [phone]    [facility] Pediatricians/Family [facility] (  [facility]): [address], [phone] [facility]: [address] [place], [phone] [facility] [place]: [address], [phone]  [facility] Doctors  [facility]): [phone] [facility]: [address] [phone]   [facility] Doctors [facility]: [address] [phone]  [facility] Doctors  [facility]: [phone], [phone]  Home Blood Pressure Monitoring for Patients   Your  provider has recommended that you check your blood pressure (BP) at least once a week at home. If you do not have a blood pressure cuff at home, one will be provided for you. Contact your provider if you have not received your monitor within 1 week.   Helpful Tips for Accurate Home Blood Pressure Checks  Don't smoke, exercise, or drink caffeine 30 minutes before checking your BP Use the restroom before checking your BP (a full bladder can raise your pressure) Relax in a comfortable upright chair Feet on the ground Left arm resting comfortably on a flat surface at the level of your heart Legs uncrossed Back supported Sit quietly and don't talk Place the cuff on your bare arm Adjust snuggly, so that only two fingertips can fit between your skin and the top of the cuff Check 2 readings separated by at least one minute Keep a log of your BP readings For a visual, please reference this diagram: http://ccnc.care/bpdiagram  Provider Name: [facility]     Phone: [phone]  Zone 1: ALL CLEAR  Continue to monitor your symptoms:  BP reading is less than 140 (top number) or less than 90 (bottom number)  No right upper stomach pain No headaches or seeing spots No feeling nauseated or throwing up No swelling in face and hands  Zone 2: CAUTION Call your doctor's office for any of the following:  BP reading is greater than 140 (top number) or greater than 90 (bottom number)  Stomach pain under your ribs in the middle or right side Headaches or seeing spots Feeling nauseated or throwing up Swelling in face and hands  Zone 3: EMERGENCY  Seek immediate medical care if you have any of the following:  BP reading is greater than160 (top number) or greater than 110 (bottom number) Severe headaches not improving with Tylenol Serious difficulty catching your breath Any worsening symptoms from Zone 2   Third Trimester of Pregnancy The third trimester is from week 29 through week 42, months  7 through 9. The third trimester is a time when the fetus is growing rapidly. At the end of the ninth month, the fetus is about 20 inches in length and weighs 6-10 pounds.  BODY CHANGES Your body goes through many changes during pregnancy. The changes vary from woman to woman.  Your weight will continue to increase. You can expect to gain 25-35 pounds (11-16 kg) by the end of the pregnancy. You may begin to get stretch marks on your hips, abdomen, and breasts. You may urinate more often because the fetus is moving lower into your pelvis and pressing on your bladder. You may develop or continue to have heartburn as a result of your pregnancy. You may develop constipation because certain hormones are causing the muscles that push waste through your intestines to slow down. You may develop hemorrhoids or swollen, bulging veins (varicose veins). You may have pelvic pain because of the weight gain and pregnancy hormones relaxing your joints between the bones in  your pelvis. Backaches may result from overexertion of the muscles supporting your posture. You may have changes in your hair. These can include thickening of your hair, rapid growth, and changes in texture. Some women also have hair loss during or after pregnancy, or hair that feels dry or thin. Your hair will most likely return to normal after your baby is born. Your breasts will continue to grow and be tender. A yellow discharge may leak from your breasts called colostrum. Your belly button may stick out. You may feel short of breath because of your expanding uterus. You may notice the fetus "dropping," or moving lower in your abdomen. You may have a bloody mucus discharge. This usually occurs a few days to a week before labor begins. Your cervix becomes thin and soft (effaced) near your due date. WHAT TO EXPECT AT YOUR PRENATAL EXAMS  You will have prenatal exams every 2 weeks until week 36. Then, you will have weekly prenatal exams. During a  routine prenatal visit: You will be weighed to make sure you and the fetus are growing normally. Your blood pressure is taken. Your abdomen will be measured to track your baby's growth. The fetal heartbeat will be listened to. Any test results from the previous visit will be discussed. You may have a cervical check near your due date to see if you have effaced. At around 36 weeks, your caregiver will check your cervix. At the same time, your caregiver will also perform a test on the secretions of the vaginal tissue. This test is to determine if a type of bacteria, Group B streptococcus, is present. Your caregiver will explain this further. Your caregiver may ask you: What your birth plan is. How you are feeling. If you are feeling the baby move. If you have had any abnormal symptoms, such as leaking fluid, bleeding, severe headaches, or abdominal cramping. If you have any questions. Other tests or screenings that may be performed during your third trimester include: Blood tests that check for low iron levels (anemia). Fetal testing to check the health, activity level, and growth of the fetus. Testing is done if you have certain medical conditions or if there are problems during the pregnancy. FALSE LABOR You may feel small, irregular contractions that eventually go away. These are called Braxton Hicks contractions, or false labor. Contractions may last for hours, days, or even weeks before true labor sets in. If contractions come at regular intervals, intensify, or become painful, it is best to be seen by your caregiver.  SIGNS OF LABOR  Menstrual-like cramps. Contractions that are 5 minutes apart or less. Contractions that start on the top of the uterus and spread down to the lower abdomen and back. A sense of increased pelvic pressure or back pain. A watery or bloody mucus discharge that comes from the vagina. If you have any of these signs before the 37th week of pregnancy, call your  caregiver right away. You need to go to the hospital to get checked immediately. HOME CARE INSTRUCTIONS  Avoid all smoking, herbs, alcohol, and unprescribed drugs. These chemicals affect the formation and growth of the baby. Follow your caregiver's instructions regarding medicine use. There are medicines that are either safe or unsafe to take during pregnancy. Exercise only as directed by your caregiver. Experiencing uterine cramps is a good sign to stop exercising. Continue to eat regular, healthy meals. Wear a good support bra for breast tenderness. Do not use hot tubs, steam rooms, or saunas. Wear your  seat belt at all times when driving. Avoid raw meat, uncooked cheese, cat litter boxes, and soil used by cats. These carry germs that can cause birth defects in the baby. Take your prenatal vitamins. Try taking a stool softener (if your caregiver approves) if you develop constipation. Eat more high-fiber foods, such as fresh vegetables or fruit and whole grains. Drink plenty of fluids to keep your urine clear or pale yellow. Take warm sitz baths to soothe any pain or discomfort caused by hemorrhoids. Use hemorrhoid cream if your caregiver approves. If you develop varicose veins, wear support hose. Elevate your feet for 15 minutes, 3-4 times a day. Limit salt in your diet. Avoid heavy lifting, wear low heal shoes, and practice good posture. Rest a lot with your legs elevated if you have leg cramps or low back pain. Visit your dentist if you have not gone during your pregnancy. Use a soft toothbrush to brush your teeth and be gentle when you floss. A sexual relationship may be continued unless your caregiver directs you otherwise. Do not travel far distances unless it is absolutely necessary and only with the approval of your caregiver. Take prenatal classes to understand, practice, and ask questions about the labor and delivery. Make a trial run to the hospital. Pack your hospital bag. Prepare  the baby's nursery. Continue to go to all your prenatal visits as directed by your caregiver. SEEK MEDICAL CARE IF: You are unsure if you are in labor or if your water has broken. You have dizziness. You have mild pelvic cramps, pelvic pressure, or nagging pain in your abdominal area. You have persistent nausea, vomiting, or diarrhea. You have a bad smelling vaginal discharge. You have pain with urination. SEEK IMMEDIATE MEDICAL CARE IF:  You have a fever. You are leaking fluid from your vagina. You have spotting or bleeding from your vagina. You have severe abdominal cramping or pain. You have rapid weight loss or gain. You have shortness of breath with chest pain. You notice sudden or extreme swelling of your face, hands, ankles, feet, or legs. You have not felt your baby move in over an hour. You have severe headaches that do not go away with medicine. You have vision changes. Document Released: 08/26/2001 Document Revised: 09/06/2013 Document Reviewed: 11/02/2012 [facility] Patient Information 2015 [place], [place]. This information is not intended to replace advice given to you by your health care provider. Make sure you discuss any questions you have with your health care provider.

## 2021-06-26 NOTE — Progress Notes (Signed)
LOW-RISK PREGNANCY VISIT Patient name: Jacqueline Ellison MRN [id number]  Date of birth: 01-08-1996 Chief Complaint:   Routine Prenatal Visit  History of Present Illness:   AUBRIONNA ISTRE is a 25 y.o. G74P1011 female at [redacted]w[redacted]d with an Estimated Date of Delivery: 08/26/21 being seen today for ongoing management of a low-risk pregnancy.   Today she reports  had to stop to clean herself up on way here b/c had mucous coming out vaginally. Started on Sunday, was a little bloody on Sunday, then brownish, now clear . Last sex ~68mths ago. Denies abnormal discharge, itching/odor/irritation.  Wants salpingectomy. Reports she still has stitches in her c/s incision from 2017 c/s- states she went to Dr. Gabriel Cirri in [place] and he had to 'open her back up 4 times' to get stitches out- said non-dissolvable stitches were used, and there are still some in there, she wants removed w/ repeat c/s.  Hasn't had pn1 or pn2, states she thought pn1 was genetic screening and didn't want to do. Thought pn2 was today.  Contractions: Not present. Vag. Bleeding: None.  Movement: Present. denies leaking of fluid.  Depression screen [facility] 2/9 04/11/2021 03/07/2020 10/08/2016  Decreased Interest 0 0 0  Down, Depressed, Hopeless 0 0 0  PHQ - 2 Score 0 0 0  Altered sleeping 0 - -  Tired, decreased energy 2 - -  Change in appetite 2 - -  Feeling bad or failure about yourself  0 - -  Trouble concentrating 2 - -  Moving slowly or fidgety/restless 0 - -  Suicidal thoughts 0 - -  PHQ-9 Score 6 - -     GAD 7 : Generalized Anxiety Score 04/11/2021  Nervous, Anxious, on Edge 3  Control/stop worrying 3  Worry too much - different things 3  Trouble relaxing 3  Restless 2  Easily annoyed or irritable 1  Afraid - awful might happen 3  Total GAD 7 Score 18      Review of Systems:   Pertinent items are noted in HPI Denies abnormal vaginal discharge w/ itching/odor/irritation, headaches, visual changes, shortness of breath, chest pain,  abdominal pain, severe nausea/vomiting, or problems with urination or bowel movements unless otherwise stated above. Pertinent History Reviewed:  Reviewed past medical,surgical, social, obstetrical and family history.  Reviewed problem list, medications and allergies. Physical Assessment:   Vitals:   06/26/21 0929  BP: 113/65  Pulse: (!) 106  Weight: 118 lb (53.5 kg)  Body mass index is 22.3 kg/m.        Physical Examination:   General appearance: Well appearing, and in no distress  Mental status: Alert, oriented to person, place, and time  Skin: Warm & dry  Cardiovascular: Normal heart rate noted  Respiratory: Normal respiratory effort, no distress  Abdomen: Soft, gravid, nontender  Pelvic:  spec exam: cx visually long/closed, no bleeding, normal appearing d/c, CV swab obtained          Extremities: Edema: None  Fetal Status: Fetal Heart Rate (bpm): 148 Fundal Height: 30 cm Movement: Present    Chaperone: Faith Rogue   No results found for this or any previous visit (from the past 24 hour(s)).  Assessment & Plan:  1) Low-risk pregnancy G3P1011 at [redacted]w[redacted]d with an Estimated Date of Delivery: 08/26/21   2) Prev c/s, reports there are still stiches from 2017 c/s present, for RCS w/ salpingecotmy. Reviewed risks/benefits, discussed high incidence regret <30yo if appropriate, LARCs just as effective, consent signed today.   3)  Vaginal d/c> normal exam today, CV swab sent  4) Recent UTI> rx'd keflex 10/5, plan urine cx poc next visit  5) Anxiety>. Taking xanax 0.5mg    Meds: No orders of the defined types were placed in this encounter.  Labs/procedures today: spec exam, CV swab, tdap, and declined flu shot  Plan:  Continue routine obstetrical care  Next visit: prefers will be in person for labs     Reviewed: Preterm labor symptoms and general obstetric precautions including but not limited to vaginal bleeding, contractions, leaking of fluid and fetal movement were reviewed in  detail with the patient.  All questions were answered.  Follow-up: Return for ASAP , PN2 (no visit), then 2wks for LROB w/ MD only, Sign BTL consent today.  No future appointments.  No orders of the defined types were placed in this encounter.  Cheral Marker CNM, [facility] 06/26/2021 9:54 AM

## 2021-06-27 LAB — CERVICOVAGINAL ANCILLARY ONLY
Bacterial Vaginitis (gardnerella): NEGATIVE
Candida Glabrata: NEGATIVE
Candida Vaginitis: NEGATIVE
Chlamydia: NEGATIVE
Comment: NEGATIVE
Comment: NEGATIVE
Comment: NEGATIVE
Comment: NEGATIVE
Comment: NEGATIVE
Comment: NORMAL
Neisseria Gonorrhea: NEGATIVE
Trichomonas: NEGATIVE

## 2021-07-01 ENCOUNTER — Other Ambulatory Visit: Payer: Medicaid Other

## 2021-07-01 DIAGNOSIS — Z3A32 32 weeks gestation of pregnancy: Secondary | ICD-10-CM

## 2021-07-02 ENCOUNTER — Encounter: Payer: Self-pay | Admitting: Women's Health

## 2021-07-02 DIAGNOSIS — Z2839 Other underimmunization status: Secondary | ICD-10-CM | POA: Insufficient documentation

## 2021-07-04 LAB — CBC/D/PLT+RPR+RH+ABO+RUBIGG...
Antibody Screen: NEGATIVE
Basophils Absolute: 0 10*3/uL (ref 0.0–0.2)
Basos: 1 %
EOS (ABSOLUTE): 0.2 10*3/uL (ref 0.0–0.4)
Eos: 3 %
HCV Ab: >11 s/co ratio — ABNORMAL HIGH (ref 0.0–0.9)
HIV Screen 4th Generation wRfx: NONREACTIVE
Hematocrit: 34.1 % (ref 34.0–46.6)
Hemoglobin: 11.4 g/dL (ref 11.1–15.9)
Hepatitis B Surface Ag: NEGATIVE
Immature Grans (Abs): 0 10*3/uL (ref 0.0–0.1)
Immature Granulocytes: 0 %
Lymphocytes Absolute: 1.9 10*3/uL (ref 0.7–3.1)
Lymphs: 25 %
MCH: 28.6 pg (ref 26.6–33.0)
MCHC: 33.4 g/dL (ref 31.5–35.7)
MCV: 86 fL (ref 79–97)
Monocytes Absolute: 0.4 10*3/uL (ref 0.1–0.9)
Monocytes: 5 %
Neutrophils Absolute: 4.9 10*3/uL (ref 1.4–7.0)
Neutrophils: 66 %
Platelets: 204 10*3/uL (ref 150–450)
RBC: 3.98 x10E6/uL (ref 3.77–5.28)
RDW: 12 % (ref 11.7–15.4)
RPR Ser Ql: NONREACTIVE
Rh Factor: POSITIVE
Rubella Antibodies, IGG: <0.9 index — ABNORMAL LOW (ref >0.99)
WBC: 7.5 10*3/uL (ref 3.4–10.8)

## 2021-07-04 LAB — HCV RT-PCR, QUANT (NON-GRAPH): Hepatitis C Quantitation: NOT DETECTED IU/mL

## 2021-07-04 LAB — GLUCOSE TOLERANCE, 2 HOURS W/ 1HR
Glucose, 1 hour: 124 mg/dL (ref 70–179)
Glucose, 2 hour: 126 mg/dL (ref 70–152)
Glucose, Fasting: 86 mg/dL (ref 70–91)

## 2021-07-10 ENCOUNTER — Encounter: Payer: Self-pay | Admitting: Obstetrics & Gynecology

## 2021-07-10 ENCOUNTER — Other Ambulatory Visit: Payer: Self-pay

## 2021-07-10 ENCOUNTER — Ambulatory Visit (INDEPENDENT_AMBULATORY_CARE_PROVIDER_SITE_OTHER): Payer: Medicaid Other | Admitting: Obstetrics & Gynecology

## 2021-07-10 VITALS — BP 106/62 | HR 82 | Wt 121.8 lb

## 2021-07-10 DIAGNOSIS — Z87440 Personal history of urinary (tract) infections: Secondary | ICD-10-CM

## 2021-07-10 DIAGNOSIS — Z3483 Encounter for supervision of other normal pregnancy, third trimester: Secondary | ICD-10-CM | POA: Diagnosis not present

## 2021-07-10 DIAGNOSIS — Z3A33 33 weeks gestation of pregnancy: Secondary | ICD-10-CM

## 2021-07-10 NOTE — Progress Notes (Addendum)
   LOW-RISK PREGNANCY VISIT Patient name: Jacqueline Ellison MRN [id number]  Date of birth: [birth year] Chief Complaint:   Routine Prenatal Visit  History of Present Illness:   Jacqueline Ellison is a 25 y.o. G43P1011 female at [redacted]w[redacted]d with an Estimated Date of Delivery: 08/26/21 being seen today for ongoing management of a low-risk pregnancy.  Depression screen [facility] 2/9 04/11/2021 03/07/2020 10/08/2016  Decreased Interest 0 0 0  Down, Depressed, Hopeless 0 0 0  PHQ - 2 Score 0 0 0  Altered sleeping 0 - -  Tired, decreased energy 2 - -  Change in appetite 2 - -  Feeling bad or failure about yourself  0 - -  Trouble concentrating 2 - -  Moving slowly or fidgety/restless 0 - -  Suicidal thoughts 0 - -  PHQ-9 Score 6 - -    Today she reports mild LE swelling- improved somewhat with rest.  Contractions: Irritability. Vag. Bleeding: None.  Movement: Present. denies leaking of fluid. Review of Systems:   Pertinent items are noted in HPI Denies abnormal vaginal discharge w/ itching/odor/irritation, headaches, visual changes, shortness of breath, chest pain, abdominal pain, severe nausea/vomiting, or problems with urination or bowel movements unless otherwise stated above. Pertinent History Reviewed:  Reviewed past medical,surgical, social, obstetrical and family history.  Reviewed problem list, medications and allergies.  Physical Assessment:   Vitals:   07/10/21 0927  BP: 106/62  Pulse: 82  Weight: 121 lb 12.8 oz (55.2 kg)  Body mass index is 23.01 kg/m.        Physical Examination:   General appearance: Well appearing, and in no distress  Mental status: Alert, oriented to person, place, and time  Skin: Warm & dry  Respiratory: Normal respiratory effort, no distress  Abdomen: Soft, gravid, nontender  Pelvic: Cervical exam deferred         Extremities: Edema: Mild pitting, slight indentation  Psych:  mood and affect appropriate  Fetal Status: Fetal Heart Rate (bpm): 140 Fundal Height: 32  cm Movement: Present    Chaperone: n/a    No results found for this or any previous visit (from the past 24 hour(s)).   Assessment & Plan:  1) Low-risk pregnancy G3P1011 at [redacted]w[redacted]d with an Estimated Date of Delivery: 08/26/21   2) Treated for UTI, pt unable to void to leave urine sample   Meds: No orders of the defined types were placed in this encounter.  Labs/procedures today: urine culture  Plan:  Continue routine obstetrical care  Next visit: prefers in person    Reviewed: Preterm labor symptoms and general obstetric precautions including but not limited to vaginal bleeding, contractions, leaking of fluid and fetal movement were reviewed in detail with the patient.  All questions were answered. Pt has home bp cuff. Check bp weekly, let us know if >140/90.   Follow-up: Return in about 2 weeks (around 07/24/2021) for LROB visit.  No orders of the defined types were placed in this encounter.  []  letter of recommendation for not moving until 6wk after surgery  , DO Attending Obstetrician & Gynecologist, [facility] for [facility], [facility]

## 2021-07-24 ENCOUNTER — Encounter (HOSPITAL_COMMUNITY): Payer: Self-pay | Admitting: Obstetrics and Gynecology

## 2021-07-24 ENCOUNTER — Inpatient Hospital Stay (HOSPITAL_COMMUNITY): Payer: Medicaid Other | Admitting: Anesthesiology

## 2021-07-24 ENCOUNTER — Inpatient Hospital Stay (HOSPITAL_COMMUNITY)
Admission: AD | Admit: 2021-07-24 | Discharge: 2021-07-27 | DRG: 783 | Disposition: A | Discharge status: Home / Self Care | Payer: Medicaid Other | Attending: Obstetrics and Gynecology | Admitting: Obstetrics and Gynecology

## 2021-07-24 ENCOUNTER — Encounter: Payer: Medicaid Other | Admitting: Women's Health

## 2021-07-24 ENCOUNTER — Encounter (HOSPITAL_COMMUNITY)
Admission: AD | Disposition: A | Discharge status: Home / Self Care | Payer: Self-pay | Source: Home / Self Care | Attending: Obstetrics and Gynecology

## 2021-07-24 DIAGNOSIS — O34211 Maternal care for low transverse scar from previous cesarean delivery: Secondary | ICD-10-CM | POA: Diagnosis present

## 2021-07-24 DIAGNOSIS — B1920 Unspecified viral hepatitis C without hepatic coma: Secondary | ICD-10-CM | POA: Diagnosis present

## 2021-07-24 DIAGNOSIS — O09899 Supervision of other high risk pregnancies, unspecified trimester: Secondary | ICD-10-CM

## 2021-07-24 DIAGNOSIS — Z98891 History of uterine scar from previous surgery: Secondary | ICD-10-CM

## 2021-07-24 DIAGNOSIS — D62 Acute posthemorrhagic anemia: Secondary | ICD-10-CM | POA: Diagnosis not present

## 2021-07-24 DIAGNOSIS — F17210 Nicotine dependence, cigarettes, uncomplicated: Secondary | ICD-10-CM | POA: Diagnosis present

## 2021-07-24 DIAGNOSIS — Z302 Encounter for sterilization: Secondary | ICD-10-CM

## 2021-07-24 DIAGNOSIS — O9081 Anemia of the puerperium: Secondary | ICD-10-CM | POA: Diagnosis not present

## 2021-07-24 DIAGNOSIS — Z3A35 35 weeks gestation of pregnancy: Secondary | ICD-10-CM | POA: Diagnosis not present

## 2021-07-24 DIAGNOSIS — Z20822 Contact with and (suspected) exposure to covid-19: Secondary | ICD-10-CM | POA: Diagnosis present

## 2021-07-24 DIAGNOSIS — O99334 Smoking (tobacco) complicating childbirth: Secondary | ICD-10-CM | POA: Diagnosis present

## 2021-07-24 DIAGNOSIS — Z23 Encounter for immunization: Secondary | ICD-10-CM | POA: Diagnosis not present

## 2021-07-24 DIAGNOSIS — O4593 Premature separation of placenta, unspecified, third trimester: Principal | ICD-10-CM | POA: Diagnosis present

## 2021-07-24 DIAGNOSIS — O9842 Viral hepatitis complicating childbirth: Secondary | ICD-10-CM

## 2021-07-24 DIAGNOSIS — F419 Anxiety disorder, unspecified: Secondary | ICD-10-CM | POA: Diagnosis present

## 2021-07-24 DIAGNOSIS — O34219 Maternal care for unspecified type scar from previous cesarean delivery: Secondary | ICD-10-CM | POA: Diagnosis not present

## 2021-07-24 DIAGNOSIS — Z3480 Encounter for supervision of other normal pregnancy, unspecified trimester: Secondary | ICD-10-CM

## 2021-07-24 DIAGNOSIS — Z9079 Acquired absence of other genital organ(s): Secondary | ICD-10-CM

## 2021-07-24 DIAGNOSIS — Z2839 Encounter for supervision of normal pregnancy, unspecified, unspecified trimester: Secondary | ICD-10-CM

## 2021-07-24 LAB — CBC
HCT: 30.8 % — ABNORMAL LOW (ref 36.0–46.0)
Hemoglobin: 10.7 g/dL — ABNORMAL LOW (ref 12.0–15.0)
MCH: 29.1 pg (ref 26.0–34.0)
MCHC: 34.7 g/dL (ref 30.0–36.0)
MCV: 83.7 fL (ref 80.0–100.0)
Platelets: 178 10*3/uL (ref 150–400)
RBC: 3.68 MIL/uL — ABNORMAL LOW (ref 3.87–5.11)
RDW: 12.3 % (ref 11.5–15.5)
WBC: 12.1 10*3/uL — ABNORMAL HIGH (ref 4.0–10.5)
nRBC: 0 % (ref 0.0–0.2)

## 2021-07-24 LAB — TYPE AND SCREEN
ABO/RH(D): A POS
Antibody Screen: NEGATIVE

## 2021-07-24 LAB — RAPID URINE DRUG SCREEN, HOSP PERFORMED
Amphetamines: NOT DETECTED
Barbiturates: NOT DETECTED
Benzodiazepines: POSITIVE — AB
Cocaine: NOT DETECTED
Opiates: NOT DETECTED
Tetrahydrocannabinol: NOT DETECTED

## 2021-07-24 LAB — RESP PANEL BY RT-PCR (FLU A&B, COVID) ARPGX2
Influenza A by PCR: NEGATIVE
Influenza B by PCR: NEGATIVE
SARS Coronavirus 2 by RT PCR: NEGATIVE

## 2021-07-24 LAB — RPR: RPR Ser Ql: NONREACTIVE

## 2021-07-24 SURGERY — Surgical Case
Anesthesia: Spinal

## 2021-07-24 MED ORDER — SCOPOLAMINE 1 MG/3DAYS TD PT72
1.0000 | MEDICATED_PATCH | Freq: Once | TRANSDERMAL | Status: DC
  Filled 2021-07-24: qty 1

## 2021-07-24 MED ORDER — HYDROMORPHONE HCL 1 MG/ML IJ SOLN: 0.2500 mg | INTRAMUSCULAR | Status: DC | PRN

## 2021-07-24 MED ORDER — LACTATED RINGERS IV SOLN: INTRAVENOUS | Status: DC | PRN

## 2021-07-24 MED ORDER — NALOXONE HCL 0.4 MG/ML IJ SOLN: 0.4000 mg | INTRAMUSCULAR | Status: DC | PRN

## 2021-07-24 MED ORDER — COCONUT OIL OIL
1.0000 "application " | TOPICAL_OIL | Status: DC | PRN
  Administered 2021-07-25: 1 via TOPICAL
  Filled 2021-07-24: qty 120

## 2021-07-24 MED ORDER — NALBUPHINE HCL 10 MG/ML IJ SOLN: 5.0000 mg | INTRAMUSCULAR | Status: DC | PRN

## 2021-07-24 MED ORDER — OXYTOCIN-SODIUM CHLORIDE 30-0.9 UT/500ML-% IV SOLN
INTRAVENOUS | Status: DC | PRN
  Administered 2021-07-24: 200 mL via INTRAVENOUS

## 2021-07-24 MED ORDER — DEXAMETHASONE SODIUM PHOSPHATE 4 MG/ML IJ SOLN
INTRAMUSCULAR | Status: AC
  Filled 2021-07-24: qty 1

## 2021-07-24 MED ORDER — OXYCODONE HCL 5 MG/5ML PO SOLN: 5.0000 mg | Freq: Once | ORAL | Status: DC | PRN

## 2021-07-24 MED ORDER — SIMETHICONE 80 MG PO CHEW
80.0000 mg | CHEWABLE_TABLET | Freq: Three times a day (TID) | ORAL | Status: DC
  Administered 2021-07-24 – 2021-07-27 (×9): 80 mg via ORAL
  Filled 2021-07-24 (×10): qty 1

## 2021-07-24 MED ORDER — PHENYLEPHRINE HCL-NACL 20-0.9 MG/250ML-% IV SOLN
INTRAVENOUS | Status: DC | PRN
  Administered 2021-07-24: 60 ug/min via INTRAVENOUS

## 2021-07-24 MED ORDER — LACTATED RINGERS IV SOLN
INTRAVENOUS | Status: DC
  Administered 2021-07-24: 1000 mL via INTRAVENOUS

## 2021-07-24 MED ORDER — PHENYLEPHRINE HCL (PRESSORS) 10 MG/ML IV SOLN
INTRAVENOUS | Status: DC | PRN
  Administered 2021-07-24: 80 ug via INTRAVENOUS

## 2021-07-24 MED ORDER — GABAPENTIN 100 MG PO CAPS
100.0000 mg | ORAL_CAPSULE | Freq: Two times a day (BID) | ORAL | Status: DC
  Administered 2021-07-24 – 2021-07-25 (×4): 100 mg via ORAL
  Filled 2021-07-24 (×4): qty 1

## 2021-07-24 MED ORDER — BUPIVACAINE IN DEXTROSE 0.75-8.25 % IT SOLN
INTRATHECAL | Status: DC | PRN
  Administered 2021-07-24: 1.6 mL via INTRATHECAL

## 2021-07-24 MED ORDER — FENTANYL CITRATE (PF) 100 MCG/2ML IJ SOLN
INTRAMUSCULAR | Status: AC
  Filled 2021-07-24: qty 2

## 2021-07-24 MED ORDER — ONDANSETRON HCL 4 MG/2ML IJ SOLN
INTRAMUSCULAR | Status: AC
  Filled 2021-07-24: qty 2

## 2021-07-24 MED ORDER — SODIUM CHLORIDE 0.9% FLUSH: 3.0000 mL | INTRAVENOUS | Status: DC | PRN

## 2021-07-24 MED ORDER — ONDANSETRON HCL 4 MG/2ML IJ SOLN: 4.0000 mg | Freq: Three times a day (TID) | INTRAMUSCULAR | Status: DC | PRN

## 2021-07-24 MED ORDER — KETOROLAC TROMETHAMINE 30 MG/ML IJ SOLN
30.0000 mg | Freq: Four times a day (QID) | INTRAMUSCULAR | Status: AC | PRN
  Administered 2021-07-24 – 2021-07-25 (×2): 30 mg via INTRAVENOUS
  Filled 2021-07-24: qty 1

## 2021-07-24 MED ORDER — KETOROLAC TROMETHAMINE 30 MG/ML IJ SOLN: 30.0000 mg | Freq: Four times a day (QID) | INTRAMUSCULAR | Status: AC | PRN

## 2021-07-24 MED ORDER — ALPRAZOLAM 0.5 MG PO TABS
0.5000 mg | ORAL_TABLET | Freq: Every evening | ORAL | Status: DC | PRN
  Administered 2021-07-24: 0.5 mg via ORAL
  Filled 2021-07-24: qty 1

## 2021-07-24 MED ORDER — MENTHOL 3 MG MT LOZG
1.0000 | LOZENGE | OROMUCOSAL | Status: DC | PRN
  Filled 2021-07-24: qty 9

## 2021-07-24 MED ORDER — WITCH HAZEL-GLYCERIN EX PADS: 1.0000 "application " | MEDICATED_PAD | CUTANEOUS | Status: DC | PRN

## 2021-07-24 MED ORDER — SENNOSIDES-DOCUSATE SODIUM 8.6-50 MG PO TABS
2.0000 | ORAL_TABLET | ORAL | Status: DC
  Filled 2021-07-24 (×4): qty 2

## 2021-07-24 MED ORDER — DIPHENHYDRAMINE HCL 25 MG PO CAPS: 25.0000 mg | ORAL_CAPSULE | Freq: Four times a day (QID) | ORAL | Status: DC | PRN

## 2021-07-24 MED ORDER — ONDANSETRON HCL 4 MG/2ML IJ SOLN
INTRAMUSCULAR | Status: DC | PRN
  Administered 2021-07-24: 4 mg via INTRAVENOUS

## 2021-07-24 MED ORDER — OXYCODONE HCL 5 MG PO TABS: 5.0000 mg | ORAL_TABLET | Freq: Once | ORAL | Status: DC | PRN

## 2021-07-24 MED ORDER — ONDANSETRON 4 MG PO TBDP
4.0000 mg | ORAL_TABLET | Freq: Three times a day (TID) | ORAL | Status: DC | PRN
  Administered 2021-07-26: 4 mg via ORAL
  Filled 2021-07-24: qty 1

## 2021-07-24 MED ORDER — DIPHENHYDRAMINE HCL 50 MG/ML IJ SOLN: 12.5000 mg | INTRAMUSCULAR | Status: DC | PRN

## 2021-07-24 MED ORDER — PROMETHAZINE HCL 25 MG/ML IJ SOLN: 6.2500 mg | INTRAMUSCULAR | Status: DC | PRN

## 2021-07-24 MED ORDER — OXYTOCIN-SODIUM CHLORIDE 30-0.9 UT/500ML-% IV SOLN
INTRAVENOUS | Status: AC
  Filled 2021-07-24: qty 500

## 2021-07-24 MED ORDER — TERBUTALINE SULFATE 1 MG/ML IJ SOLN
INTRAMUSCULAR | Status: AC
  Administered 2021-07-24: 0.25 mg via SUBCUTANEOUS
  Filled 2021-07-24: qty 1

## 2021-07-24 MED ORDER — NALBUPHINE HCL 10 MG/ML IJ SOLN: 5.0000 mg | Freq: Once | INTRAMUSCULAR | Status: DC | PRN

## 2021-07-24 MED ORDER — PHENYLEPHRINE HCL-NACL 20-0.9 MG/250ML-% IV SOLN
INTRAVENOUS | Status: AC
  Filled 2021-07-24: qty 250

## 2021-07-24 MED ORDER — NALOXONE HCL 4 MG/10ML IJ SOLN
1.0000 ug/kg/h | INTRAVENOUS | Status: DC | PRN
  Filled 2021-07-24: qty 5

## 2021-07-24 MED ORDER — OXYCODONE HCL 5 MG PO TABS
5.0000 mg | ORAL_TABLET | ORAL | Status: DC | PRN
  Administered 2021-07-24: 5 mg via ORAL
  Administered 2021-07-25 – 2021-07-26 (×7): 10 mg via ORAL
  Filled 2021-07-24 (×4): qty 2
  Filled 2021-07-24: qty 1
  Filled 2021-07-24 (×3): qty 2

## 2021-07-24 MED ORDER — FENTANYL CITRATE (PF) 100 MCG/2ML IJ SOLN
INTRAMUSCULAR | Status: DC | PRN
  Administered 2021-07-24: 15 ug via INTRATHECAL

## 2021-07-24 MED ORDER — OXYTOCIN-SODIUM CHLORIDE 30-0.9 UT/500ML-% IV SOLN: 2.5000 [IU]/h | INTRAVENOUS | Status: AC

## 2021-07-24 MED ORDER — KETOROLAC TROMETHAMINE 30 MG/ML IJ SOLN
INTRAMUSCULAR | Status: AC
  Filled 2021-07-24: qty 1

## 2021-07-24 MED ORDER — DIPHENHYDRAMINE HCL 25 MG PO CAPS: 25.0000 mg | ORAL_CAPSULE | ORAL | Status: DC | PRN

## 2021-07-24 MED ORDER — DEXAMETHASONE SODIUM PHOSPHATE 4 MG/ML IJ SOLN
INTRAMUSCULAR | Status: DC | PRN
  Administered 2021-07-24: 4 mg via INTRAVENOUS

## 2021-07-24 MED ORDER — MORPHINE SULFATE (PF) 0.5 MG/ML IJ SOLN: INTRAMUSCULAR | Status: DC | PRN

## 2021-07-24 MED ORDER — SIMETHICONE 80 MG PO CHEW
80.0000 mg | CHEWABLE_TABLET | ORAL | Status: DC | PRN
  Filled 2021-07-24: qty 1

## 2021-07-24 MED ORDER — HYDROMORPHONE HCL 1 MG/ML IJ SOLN
0.2500 mg | INTRAMUSCULAR | Status: DC | PRN
  Administered 2021-07-24: 0.5 mg via INTRAVENOUS

## 2021-07-24 MED ORDER — MORPHINE SULFATE (PF) 0.5 MG/ML IJ SOLN
INTRAMUSCULAR | Status: AC
  Filled 2021-07-24: qty 10

## 2021-07-24 MED ORDER — CEFAZOLIN SODIUM-DEXTROSE 2-4 GM/100ML-% IV SOLN
2.0000 g | INTRAVENOUS | Status: AC
  Administered 2021-07-24: 2 g via INTRAVENOUS
  Filled 2021-07-24: qty 100

## 2021-07-24 MED ORDER — IBUPROFEN 600 MG PO TABS
600.0000 mg | ORAL_TABLET | Freq: Four times a day (QID) | ORAL | Status: DC
  Administered 2021-07-24 – 2021-07-27 (×11): 600 mg via ORAL
  Filled 2021-07-24 (×11): qty 1

## 2021-07-24 MED ORDER — PRENATAL MULTIVITAMIN CH
1.0000 | ORAL_TABLET | Freq: Every day | ORAL | Status: DC
  Administered 2021-07-24 – 2021-07-27 (×4): 1 via ORAL
  Filled 2021-07-24 (×5): qty 1

## 2021-07-24 MED ORDER — ZOLPIDEM TARTRATE 5 MG PO TABS: 5.0000 mg | ORAL_TABLET | Freq: Every evening | ORAL | Status: DC | PRN

## 2021-07-24 MED ORDER — SOD CITRATE-CITRIC ACID 500-334 MG/5ML PO SOLN
30.0000 mL | Freq: Once | ORAL | Status: AC
  Administered 2021-07-24: 30 mL via ORAL
  Filled 2021-07-24: qty 30

## 2021-07-24 MED ORDER — TETANUS-DIPHTH-ACELL PERTUSSIS 5-2.5-18.5 LF-MCG/0.5 IM SUSY: 0.5000 mL | PREFILLED_SYRINGE | Freq: Once | INTRAMUSCULAR | Status: DC

## 2021-07-24 MED ORDER — TERBUTALINE SULFATE 1 MG/ML IJ SOLN: 0.2500 mg | Freq: Once | INTRAMUSCULAR | Status: DC

## 2021-07-24 MED ORDER — MORPHINE SULFATE (PF) 0.5 MG/ML IJ SOLN
INTRAMUSCULAR | Status: DC | PRN
  Administered 2021-07-24: 150 ug via INTRATHECAL

## 2021-07-24 MED ORDER — HYDROMORPHONE HCL 1 MG/ML IJ SOLN
INTRAMUSCULAR | Status: AC
  Filled 2021-07-24: qty 0.5

## 2021-07-24 MED ORDER — DIBUCAINE (PERIANAL) 1 % EX OINT
1.0000 "application " | TOPICAL_OINTMENT | CUTANEOUS | Status: DC | PRN
  Filled 2021-07-24: qty 28

## 2021-07-24 MED ORDER — ACETAMINOPHEN 10 MG/ML IV SOLN
INTRAVENOUS | Status: AC
  Filled 2021-07-24: qty 100

## 2021-07-24 MED ORDER — LACTATED RINGERS IV BOLUS
1000.0000 mL | Freq: Once | INTRAVENOUS | Status: AC
  Administered 2021-07-24: 1000 mL via INTRAVENOUS

## 2021-07-24 MED ORDER — FENTANYL CITRATE (PF) 100 MCG/2ML IJ SOLN: INTRAMUSCULAR | Status: DC | PRN

## 2021-07-24 MED ORDER — MEPERIDINE HCL 25 MG/ML IJ SOLN: 6.2500 mg | INTRAMUSCULAR | Status: DC | PRN

## 2021-07-24 SURGICAL SUPPLY — 28 items
CHLORAPREP W/TINT 26ML (MISCELLANEOUS) ×2 IMPLANT
CLAMP CORD UMBIL (MISCELLANEOUS) IMPLANT
DRSG OPSITE POSTOP 4X10 (GAUZE/BANDAGES/DRESSINGS) ×2 IMPLANT
ELECT REM PT RETURN 9FT ADLT (ELECTROSURGICAL) ×2
ELECTRODE REM PT RTRN 9FT ADLT (ELECTROSURGICAL) ×1 IMPLANT
EXTRACTOR VACUUM M CUP 4 TUBE (SUCTIONS) IMPLANT
GLOVE BIOGEL PI IND STRL 6.5 (GLOVE) ×1 IMPLANT
GLOVE BIOGEL PI IND STRL 7.0 (GLOVE) ×1 IMPLANT
GLOVE BIOGEL PI INDICATOR 6.5 (GLOVE) ×1
GLOVE BIOGEL PI INDICATOR 7.0 (GLOVE) ×1
GLOVE SURG SS PI 6.5 STRL IVOR (GLOVE) ×2 IMPLANT
GOWN STRL REUS W/TWL LRG LVL3 (GOWN DISPOSABLE) ×4 IMPLANT
KIT ABG SYR 3ML LUER SLIP (SYRINGE) IMPLANT
NEEDLE HYPO 25X5/8 SAFETYGLIDE (NEEDLE) IMPLANT
NS IRRIG 1000ML POUR BTL (IV SOLUTION) ×2 IMPLANT
PACK C SECTION WH (CUSTOM PROCEDURE TRAY) ×2 IMPLANT
PAD OB MATERNITY 4.3X12.25 (PERSONAL CARE ITEMS) ×2 IMPLANT
PENCIL SMOKE EVAC W/HOLSTER (ELECTROSURGICAL) ×2 IMPLANT
RTRCTR C-SECT PINK 25CM LRG (MISCELLANEOUS) IMPLANT
SUT PLAIN 0 NONE (SUTURE) ×2 IMPLANT
SUT PLAIN 2 0 XLH (SUTURE) ×2 IMPLANT
SUT VIC AB 0 CT1 36 (SUTURE) ×8 IMPLANT
SUT VIC AB 2-0 CT1 27 (SUTURE) ×2
SUT VIC AB 2-0 CT1 TAPERPNT 27 (SUTURE) ×1 IMPLANT
SUT VIC AB 4-0 KS 27 (SUTURE) ×2 IMPLANT
TOWEL OR 17X24 6PK STRL BLUE (TOWEL DISPOSABLE) ×2 IMPLANT
TRAY FOLEY W/BAG SLVR 14FR LF (SET/KITS/TRAYS/PACK) ×2 IMPLANT
WATER STERILE IRR 1000ML POUR (IV SOLUTION) ×2 IMPLANT

## 2021-07-24 NOTE — Op Note (Addendum)
Jacqueline Ellison PROCEDURE DATE: 07/24/2021  PREOPERATIVE DIAGNOSES: Intrauterine pregnancy at [redacted]w[redacted]d weeks gestation; previous uterine incision, undesired fertility   POSTOPERATIVE DIAGNOSES: The same, mild placental abruption   PROCEDURE: Repeat Low Transverse Cesarean Section with bilateral salpingectomy  SURGEON:   Cline Cools   ANESTHESIOLOGY TEAM: Anesthesiologist: Lucretia Kern, MD CRNA: Rhymer, Doree Fudge, CRNA  INDICATIONS: Jacqueline Ellison is a 25 y.o. [phone] at [redacted]w[redacted]d here for cesarean section secondary to the indications listed under preoperative diagnoses; please see preoperative note for further details.  The risks of cesarean section were discussed with the patient including but were not limited to: bleeding which may require transfusion or reoperation; infection which may require antibiotics; injury to bowel, bladder, ureters or other surrounding organs; injury to the fetus; need for additional procedures including hysterectomy in the event of a life-threatening hemorrhage; placental abnormalities wth subsequent pregnancies, incisional problems, thromboembolic phenomenon and other postoperative/anesthesia complications.   The patient concurred with the proposed plan, giving informed written consent for the procedure.    FINDINGS:  Viable female infant in cephalic presentation.  Apgars APGAR (1 MIN): 8  APGAR (5 MINS): 9 Clear amniotic fluid.  Intact placenta with clots, three vessel cord.  Normal uterus, fallopian tubes and ovaries bilaterally. Semi-dissolved suture material with fascial layer on left corner of likely prior CS. Concentrated urine/dehydration   ANESTHESIA: Spinal INTRAVENOUS FLUIDS: 3200 ml   ESTIMATED BLOOD LOSS: 679 ml URINE OUTPUT:  50 ml SPECIMENS: Placenta sent to pathology COMPLICATIONS: None immediate  PROCEDURE IN DETAIL: The patient preoperatively received intravenous antibiotics and had sequential compression devices applied  to her lower extremities.  She was then taken to the operating room where spinal anesthesia was administered and was found to be adequate. She was then placed in a dorsal supine position with a leftward tilt, and prepped and draped in a sterile manner.  A foley catheter was placed into her bladder and attached to constant gravity.  After an adequate timeout was performed, a Pfannenstiel skin incision was made with scalpel on her preexisting scar and carried through to the underlying layer of fascia. The fascia was incised in the midline, and this incision was extended bilaterally using the Mayo scissors.  Kocher clamps were applied to the superior aspect of the fascial incision and the underlying rectus muscles were dissected off bluntly and sharply.  A similar process was carried out on the inferior aspect of the fascial incision. The rectus muscles were separated in the midline and the peritoneum was entered bluntly. The Alexis self-retaining retractor was introduced into the abdominal cavity.  Attention was turned to the lower uterine segment where a bladder flap was created using [employer]. Then a low transverse hysterotomy was made with a scalpel and extended bilaterally bluntly.  The infant was successfully delivered, the cord was clamped and cut after one minute, and the infant was handed over to the awaiting neonatology team. Uterine massage was then administered, and the placenta delivered intact with a three-vessel cord, did encounter a few clots prior to placenta retrieval. The uterus was then cleared of clots and debris.  The hysterotomy was closed with 0 Vicryl in a running locked fashion, and an imbricating layer was also placed with 0 Vicryl.   Attention was then turned to the fallopian tubes. Bilateral salpingectomy: A Kelly clamp was placed across the left fallopian tube taking care to incorporate the fimbriae. A second clamp was then placed below the first. The fallopian tube  was then removed  with Metzenbaum scissors. The pedicle was then ligated with free tie suture. The fallopian tube was then removed with Metzenbaum scissors. Then a second ligature of free tie suture was placed below the remaining clamp, the clamp was then removed and again excellent hemostasis was observed. The same procedure was then carried out on the right fallopian tube with excellent hemostasis noted.   The pelvis was cleared of all clot and debris. Hemostasis was confirmed on all surfaces.  The retractor was removed.  The peritoneum was closed with a 0 Vicryl running stitch. Semi-dissolved suture material noted within the lateral left fascia from a previous hernia repair. Removed two stitches with Adson pickups, however some small pieces left within scar tissue. The fascia was then closed using 0 Vicryl in a running fashion.  The skin was closed with a 4-0 Vicryl subcuticular stitch. The patient tolerated the procedure well. Sponge, instrument and needle counts were correct x 3.  She was taken to the recovery room in stable condition.   Allayne Stack, DO Center for [facility]    Attestation of Attending Supervision of OB Fellow: Evaluation and management procedures were performed by the Family Medicine OB Fellow under my supervision.  I have reviewed the Fellow's note and chart. I was gloved and gowned for the entirety of the procedure and involved during the case. I have made any necessary editorial changes.    Federico Flake, MD, MPH, ABFM Attending Physician Center [facility]

## 2021-07-24 NOTE — Progress Notes (Signed)
Jacqueline Ellison is a 25 y.o. G3P1011 at 72w2dpresented in active labor. H/o CS x1 for breech. Declined vaginal attempt and desires sterilization.  Met patient and reviewed plan for RCS with BTS- salpingectomy planned  The risks of cesarean section were discussed with the patient including but were not limited to: bleeding which may require transfusion or reoperation; infection which may require antibiotics; injury to bowel, bladder, ureters or other surrounding organs; injury to the fetus; need for additional procedures including hysterectomy in the event of a life-threatening hemorrhage; placental abnormalities wth subsequent pregnancies, incisional problems, thromboembolic phenomenon and other postoperative/anesthesia complications.  Patient also desires permanent sterilization.  Other reversible forms of contraception were discussed with patient; she declines all other modalities. Risks of procedure discussed with patient including but not limited to: risk of regret, permanence of method, bleeding, infection, injury to surrounding organs and need for additional procedures.  Failure risk of 1-2% with increased risk of ectopic gestation if pregnancy occurs was also discussed with patient.  The patient concurred with the proposed plan, giving informed written consent for the procedures. Consent was signed partner PMora BellmanMD

## 2021-07-24 NOTE — Anesthesia Procedure Notes (Signed)
Spinal  Patient location during procedure: OR Reason for block: surgical anesthesia Staffing Performed: anesthesiologist  Anesthesiologist: Treva Huyett E, MD Preanesthetic Checklist Completed: patient identified, IV checked, risks and benefits discussed, surgical consent, monitors and equipment checked, pre-op evaluation and timeout performed Spinal Block Patient position: sitting Prep: DuraPrep and site prepped and draped Patient monitoring: continuous pulse ox, blood pressure and heart rate Approach: midline Location: L3-4 Injection technique: single-shot Needle Needle type: Pencan  Needle gauge: 24 G Needle length: 9 cm Assessment Events: CSF return Additional Notes Functioning IV was confirmed and monitors were applied. Sterile prep and drape, including hand hygiene and sterile gloves were used. The patient was positioned and the spine was prepped. The skin was anesthetized with lidocaine.  Free flow of clear CSF was obtained prior to injecting local anesthetic into the CSF. The needle was carefully withdrawn. The patient tolerated the procedure well.     

## 2021-07-24 NOTE — MAU Note (Signed)
Pt arrived by ems, contracting and bleeding. Abd firm on palp.  CNM was called to the rm.  Pt has hx of c/s wanting repeat. Pt 9 cm with BBOW, insisting on repeat.  Taking xanax

## 2021-07-24 NOTE — MAU Note (Signed)
NICU charge called, pt for Rc/s, 35.2wks.  waiting on lab work

## 2021-07-24 NOTE — Anesthesia Postprocedure Evaluation (Signed)
Anesthesia Post Note  Patient: Jacqueline Ellison  Procedure(s) Performed: CESAREAN SECTION     Patient location during evaluation: PACU Anesthesia Type: Spinal Level of consciousness: oriented and awake and alert Pain management: pain level controlled Vital Signs Assessment: post-procedure vital signs reviewed and stable Respiratory status: spontaneous breathing, respiratory function stable and nonlabored ventilation Cardiovascular status: blood pressure returned to baseline and stable Postop Assessment: no headache, no backache, no apparent nausea or vomiting and spinal receding Anesthetic complications: no   No notable events documented.  Last Vitals:  Vitals:   07/24/21 1116 07/24/21 1130  BP:  108/64  Pulse: 60 78  Resp: 10 17  Temp:    SpO2: 100% 100%    Last Pain:  Vitals:   07/24/21 1138  TempSrc:   PainSc: 5    Pain Goal:    LLE Motor Response: Purposeful movement (07/24/21 1130)   RLE Motor Response: Purposeful movement (07/24/21 1130)       Epidural/Spinal Function Cutaneous sensation: Able to Wiggle Toes (07/24/21 1130), Patient able to flex knees: Yes (07/24/21 1115), Patient able to lift hips off bed: Yes (07/24/21 1115), Back pain beyond tenderness at insertion site: No (07/24/21 1115), Progressively worsening motor and/or sensory loss: No (07/24/21 1115), Bowel and/or bladder incontinence post epidural: No (07/24/21 1115)  Lucretia Kern

## 2021-07-24 NOTE — Transfer of Care (Signed)
Immediate Anesthesia Transfer of Care Note  Patient: Jacqueline Ellison  Procedure(s) Performed: CESAREAN SECTION  Patient Location: PACU  Anesthesia Type:Spinal  Level of Consciousness: awake, alert  and oriented  Airway & Oxygen Therapy: Patient Spontanous Breathing  Post-op Assessment: Report given to RN and Post -op Vital signs reviewed and stable  HR 66, RR 14, SaO2 100%, BP 90/64 Post vital signs: Reviewed and stable  Last Vitals:  Vitals Value Taken Time  BP    Temp    Pulse    Resp    SpO2      Last Pain:  Vitals:   07/24/21 0742  TempSrc:   PainSc: 10-Worst pain ever         Complications: No notable events documented.

## 2021-07-24 NOTE — H&P (Addendum)
OBSTETRIC ADMISSION HISTORY AND PHYSICAL  Jacqueline Ellison is a 25 y.o. female G3P1011 with IUP at [redacted]w[redacted]d by 11 wks U/S presenting via EMS for bleeding and contractions. Her contractions started at 0550. When EMS arrived at her house at a~ 0636, she was on her LT side on the BR floor having UC's and vaginal bleeding. She reports (+) FM.  Reports fetal movement. Denies vaginal bleeding.  She received her prenatal care at [facility].  Support person in labor: mother  Ultrasounds Anatomy U/S: 1.  [id number] Estimated Date of Delivery: 08/26/21 by  early ultrasound and confirmed by today's sonographic dating 2.  Small stomach bubble, probably just episodic variation, otherwise, Normal fetal sonographic findings, specifically normal detailed anatomical evaluation,      no abnormalities noted 3.  Normal general sonographic findings  Prenatal History/Complications: Previous C/S for breech Anxiety Rubella Non-Immune  OB BOX:   FAMILY TREE  RESULTS  Language English Pap 03/07/20 neg  Initiated care at [facility] GC/CT Initial: -/-           36wks:  Dating by 11wk U/S    Support person  Genetics NT/IT:too late    AFP:      Panorama:   BP cuff  Carrier Screen     [place]/Hgb Elec   Rhogam n/a    TDaP vaccine 06/26/21  Blood Type    Flu vaccine Declined 06/26/21  Antibody    Covid vaccine  HBsAg      RPR    Anatomy US Nl female 'Brandon' Rubella     Feeding Plan both HIV    Contraception salpingectomy Hep C   Circumcision yes    Pediatrician [facility] A1C/GTT Early:      26-28wks: normal  Prenatal Classes discussed      GBS     [ ]  PCN allergy  BTL Consent 06/26/21     VBAC Consent n/a PHQ9 & GAD7  [ ] New OB  [ ] 28wks   [ ] 36wks  Waterbirth [ ] Class [ ]  36wkCNM visit/consent     Past Medical History: Past Medical History:  Diagnosis Date   Anxiety    Arthritis    Chlamydia infection    Hepatitis C 07/16/2017   Marijuana use 01/14/2016   Counseled 01/14/2016    Repeat:___   Mental disorder     depression    Past Surgical History: Past Surgical History:  Procedure Laterality Date   BLADDER SURGERY     bladder stretch 2001?   CESAREAN SECTION N/A 04/25/2016   Procedure: CESAREAN SECTION;  Surgeon: , MD;  Location: [facility] BIRTHING SUITES;  Service: Obstetrics;  Laterality: N/A;   HERNIA REPAIR     HERNIA REPAIR  2009   WISDOM TOOTH EXTRACTION  2009    Obstetrical History: OB History     Gravida  3   Para  1   Term  1   Preterm      AB  1   Living  1      SAB  1   IAB      Ectopic      Multiple  0   Live Births  1           Social History: Social History   Socioeconomic History   Marital status: Single    Spouse name: Not on file   Number of children: Not on file   Years of education: Not on file   Highest education level: Not on file  Occupational History   Not on file  Tobacco Use   Smoking status: Every Day    Packs/day: 0.25    Years: 5.00    Pack years: 1.25    Types: Cigarettes   Smokeless tobacco: Never  Vaping Use   Vaping Use: Some days  Substance and Sexual Activity   Alcohol use: No   Drug use: No    Comment: Marijuana prior to pregnancy   Sexual activity: Yes    Partners: Male    Birth control/protection: None  Other Topics Concern   Not on file  Social History Narrative   Not on file   Social Determinants of Health   Financial Resource Strain: Low Risk    Difficulty of Paying Living Expenses: Not very hard  Food Insecurity: No Food Insecurity   Worried About Programme researcher, broadcasting/film/video in the Last Year: Never true   Ran Out of Food in the Last Year: Never true  Transportation Needs: No Transportation Needs   Lack of Transportation (Medical): No   Lack of Transportation (Non-Medical): No  Physical Activity: Sufficiently Active   Days of Exercise per Week: 4 days   Minutes of Exercise per Session: 40 min  Stress: Stress Concern Present   Feeling of Stress : Rather much  Social Connections: Moderately  Integrated   Frequency of Communication with Friends and Family: Once a week   Frequency of Social Gatherings with Friends and Family: Once a week   Attends Religious Services: 1 to 4 times per year   Active Member of [employer] or Organizations: Yes   Attends Engineer, structural: More than 4 times per year   Marital Status: Married    Family History: Family History  Problem Relation Age of Onset   Hypertension Mother    Diabetes Mother    Hypertension Father    Hypertension Sister    Heart attack Maternal Uncle    Hypertension Maternal Grandmother    Diabetes Maternal Grandmother    Hypertension Maternal Grandfather    Aneurysm Maternal Grandfather    Hypertension Paternal Grandmother    Hypertension Paternal Grandfather    Heart disease Paternal Grandfather     Allergies: Allergies  Allergen Reactions   Depo-Provera [Medroxyprogesterone Acetate] Other (See Comments)    Broke out with acne all over body.   Tylenol [Acetaminophen] Nausea And Vomiting    Medications Prior to Admission  Medication Sig Dispense Refill Last Dose   ALPRAZolam (XANAX) 0.5 MG tablet Take 0.5 mg by mouth at bedtime as needed for anxiety.      Blood Pressure Monitor MISC For regular home bp monitoring during pregnancy 1 each 0    ondansetron (ZOFRAN ODT) 4 MG disintegrating tablet Take 1 tablet (4 mg total) by mouth every 8 (eight) hours as needed for nausea or vomiting. 30 tablet 1    pantoprazole (PROTONIX) 20 MG tablet Take 1 tablet (20 mg total) by mouth 2 (two) times daily before a meal. 60 tablet 3    Prenatal Vit-Fe Fumarate-FA (PRENATAL VITAMINS) 28-0.8 MG TABS Take 1 tablet by mouth daily. 90 tablet 4    promethazine (PHENERGAN) 25 MG tablet Take 1 tablet (25 mg total) by mouth every 6 (six) hours as needed for nausea or vomiting. (Patient not taking: No sig reported) 30 tablet 1      Review of Systems  All systems reviewed and negative except as stated in HPI  Blood pressure  133/74, pulse 61, SpO2 98 %. General appearance: alert,  cooperative, and moderate distress Lungs: no respiratory distress Heart: regular rate  Abdomen: soft, non-tender; gravid  Pelvic: adequate Extremities: Homans sign is negative, no sign of DVT Presentation: cephalic Fetal monitoring: 120 bpm / moderate variability / accels present / decels absent Uterine activity: regular every 5 mins Dilation: 9 Effacement (%): 100 Exam by:: Carloyn Jaeger, CNM BBOW seen with speculum  Patient informed that the ultrasound is considered a limited OB ultrasound and is not intended to be a complete ultrasound exam.  Patient also informed that the ultrasound is not being completed with the intent of assessing for fetal or placental anomalies or any pelvic abnormalities.  Explained that the purpose of today's ultrasound is to assess for presentation.  Baby was found to be in a cephalic presentation. Patient acknowledges the purpose of the exam and the limitations of the study.  Prenatal labs: ABO, Rh: A/Positive/-- (10/17 2446) Antibody: Negative (10/17 0916) Rubella: <0.90 (10/17 0916) RPR: Non Reactive (10/17 0916)  HBsAg: Negative (10/17 0916)  HIV: Non Reactive (10/17 0916)  GBS:  unknown Glucola: Normal Genetic screening:  Not done  Prenatal Transfer Tool  Maternal Diabetes: No Genetic Screening: Declined Maternal Ultrasounds/Referrals: Normal Fetal Ultrasounds or other Referrals:  None Maternal Substance Abuse:  No Significant Maternal Medications:  Meds include: Other: Xanax Significant Maternal Lab Results: Group B Strep negative  No results found for this or any previous visit (from the past 24 hour(s)).  Patient Active Problem List   Diagnosis Date Noted   Rubella non-immune status, antepartum 07/02/2021   Request for sterilization 06/12/2021   Encounter for supervision of normal pregnancy, antepartum 04/03/2021   Previous cesarean section 07/02/2018   Hepatitis C 07/16/2017    Postpartum depression 07/02/2016   Depression 01/14/2016   possible history of hypothyroidism as a child    Arthritis    Anxiety     Assessment/Plan:  Jacqueline Ellison is a 25 y.o. G3P1011 at [redacted]w[redacted]d here for active preterm labor and bleeding  Labor: active, advanced dilation -- pain control: requesting spinal  Fetal Wellbeing: EFW 7 lbs by Leopold's. Cephalic by U/S.  -- GBS (Negative) -- continuous fetal monitoring - Category 1   Postpartum Planning -- breast -- Tdap done 06/26/21  flu declined 06/26/21   Raelyn Mora, CNM  07/24/2021, 7:42 AM

## 2021-07-24 NOTE — Lactation Note (Signed)
This note was copied from a baby's chart. Lactation Consultation Note  Patient Name: Jacqueline Ellison [id number] Date: 07/24/2021 Reason for consult: Initial assessment;NICU baby;Late-preterm 34-36.6wks;Other (Comment);Infant < 6lbs, Hx of Hepatitis C, (+) Benzodiazepines an L2 Age:27 hours  Visited with mom of 4 hours old LPI NICU female, she's a P2 and reported (+) breast changes during the pregnancy. Mom has a Hx of hepatitis, LC provided education on what to do in case she noticed any bleeding on her nipples.   Mom has already started pumping and getting some colostrum, praised her for her efforts. Reviewed pumping schedule, expectations, pumping log, lactogenesis II and benefits of breastmilk for NICU babies.  Maternal Data Does the patient have breastfeeding experience prior to this delivery?: Yes How long did the patient breastfeed?: 1 month  Feeding Mother's Current Feeding Choice: Breast Milk  Lactation Tools Discussed/Used Tools: Pump;Flanges;Coconut oil Flange Size: 24 Breast pump type: Double-Electric Breast Pump Pump Education: Setup, frequency, and cleaning;Milk Storage Reason for Pumping: LPI in NICU Pumping frequency: q 3 hours (recommended) Pumped volume:  (drops)  Interventions Interventions: Breast feeding basics reviewed;Coconut oil;DEBP;Education;"The NICU and Your Baby" book;LC Services brochure  Plan of care  Encouraged mom to start pumping consistently every 3 hours, at least 8 pumping sessions/24 hours Hand expression, breast massage and coconut oil were also encouraged prior pumping  Great-GOB (maternal) present. All questions and concerns answered, family to call NICU LC PRN.  Discharge Pump: DEBP (Mom voiced her friend from work will buy her a wearable DEBP) WIC Program: No (but had an appt with [employer] tomorrow to get added to the program (as a pregnant mom, didn't know baby was going to come early)) [facility] referral was sent to [employer] Status Consult Status: Follow-up Date: 07/24/21 Follow-up type: In-patient   Decklyn Hyder Venetia Constable 07/24/2021, 1:56 PM

## 2021-07-24 NOTE — Anesthesia Preprocedure Evaluation (Addendum)
Anesthesia Evaluation  Patient identified by MRN, date of birth, ID band Patient awake    Reviewed: Allergy & Precautions, NPO status , Patient's Chart, lab work & pertinent test results  History of Anesthesia Complications Negative for: history of anesthetic complications  Airway Mallampati: II  TM Distance: >3 FB Neck ROM: Full    Dental   Pulmonary Current Smoker,    Pulmonary exam normal        Cardiovascular negative cardio ROS Normal cardiovascular exam     Neuro/Psych Anxiety Depression negative neurological ROS     GI/Hepatic negative GI ROS, (+)     substance abuse  , Hepatitis -, C  Endo/Other  negative endocrine ROS  Renal/GU negative Renal ROS  negative genitourinary   Musculoskeletal negative musculoskeletal ROS (+)   Abdominal   Peds  Hematology negative hematology ROS (+)   Anesthesia Other Findings   Reproductive/Obstetrics (+) Pregnancy (Prior C/S x1)                            Anesthesia Physical Anesthesia Plan  ASA: 2  Anesthesia Plan: Spinal   Post-op Pain Management:    Induction:   PONV Risk Score and Plan: 2 and Propofol infusion, Treatment may vary due to age or medical condition, Ondansetron and TIVA  Airway Management Planned: Nasal Cannula and Simple Face Mask  Additional Equipment: None  Intra-op Plan:   Post-operative Plan:   Informed Consent: I have reviewed the patients History and Physical, chart, labs and discussed the procedure including the risks, benefits and alternatives for the proposed anesthesia with the patient or authorized representative who has indicated his/her understanding and acceptance.       Plan Discussed with:   Anesthesia Plan Comments:         Anesthesia Quick Evaluation

## 2021-07-24 NOTE — Discharge Summary (Signed)
Postpartum Discharge Summary     Patient Name: Jacqueline Ellison DOB: 1996/02/14 MRN: [id number]  Date of admission: 07/24/2021 Delivery date:07/24/2021  Delivering provider: Caren Macadam  Date of discharge: 07/27/2021  Admitting diagnosis: S/P repeat low transverse C-section [Z98.891] Intrauterine pregnancy: [redacted]w[redacted]d    Secondary diagnosis:  Active Problems:   Anxiety   Hepatitis C   Previous cesarean section   Request for sterilization   Rubella non-immune status, antepartum   S/P repeat low transverse C-section   Status post bilateral salpingectomy  Additional problems: None    Discharge diagnosis: Term Pregnancy Delivered                                              Post partum procedures:postpartum tubal ligation Augmentation: N/A Complications: None  Hospital course: Onset of Labor With Unplanned C/S   25y.o. yo [id number] 31w2dresented to the MAU on 07/24/2021 in active labor at 9cm. However patient wished to proceed with repeat C/S rather than TOLAC. The patient went for cesarean section due to Elective Repeat. Delivery details as follows: Membrane Rupture Time/Date: 9:01 AM ,07/24/2021   Delivery Method:C-Section, Low Transverse  Details of operation can be found in separate operative note. Patient had an uncomplicated postpartum course.  She is ambulating,tolerating a regular diet, passing flatus, and urinating well.  Patient is discharged home in stable condition 07/27/21.  Newborn Data: Birth date:07/24/2021  Birth time:9:02 AM  Gender:Female  Living status:Living  Apgars:8 ,9  Weight:2490 g   Magnesium Sulfate received: No BMZ received: No Rhophylac:No MMR: Offered T-DaP:Given prenatally Transfusion:No  Physical exam  Vitals:   07/26/21 1700 07/26/21 1939 07/26/21 2240 07/27/21 0409  BP: (!) 94/46 112/68 106/67 100/72  Pulse: 89 (!) 102 95 94  Resp: '17 17 18 18  ' Temp: 98.4 F (36.9 C) 98.2 F (36.8 C) 98 F (36.7 C) 98.2 F (36.8 C)   TempSrc: Oral Oral Oral Oral  SpO2: 100% 100% 97% 99%   General: alert, cooperative, and no distress Lochia: appropriate Uterine Fundus: firm, mild tenderness to palpation Incision: Healing well with no significant drainage, No significant erythema, Dressing is clean, dry, and intact DVT Evaluation: No evidence of DVT seen on physical exam. Negative Homan's sign. No cords or calf tenderness. No significant calf/ankle edema. Labs: Lab Results  Component Value Date   WBC 13.9 (H) 07/25/2021   HGB 7.9 (L) 07/25/2021   HCT 23.5 (L) 07/25/2021   MCV 85.1 07/25/2021   PLT 139 (L) 07/25/2021   CMP Latest Ref Rng & Units 08/08/2020  Glucose 65 - 99 mg/dL 90  BUN 6 - 20 mg/dL 11  Creatinine 0.57 - 1.00 mg/dL 0.74  Sodium 134 - 144 mmol/L 137  Potassium 3.5 - 5.2 mmol/L 4.9  Chloride 96 - 106 mmol/L 101  CO2 20 - 29 mmol/L 26  Calcium 8.7 - 10.2 mg/dL 10.0  Total Protein 6.1 - 8.1 g/dL -  Total Bilirubin 0.2 - 1.2 mg/dL -  Alkaline Phos 33 - 115 U/L -  AST 10 - 30 U/L -  ALT 6 - 29 U/L -   Edinburgh Score: Edinburgh Postnatal Depression Scale Screening Tool 07/24/2021  I have been able to laugh and see the funny side of things. (No Data)     After visit meds:  Allergies as of 07/27/2021  Reactions   Depo-provera [medroxyprogesterone Acetate] Other (See Comments)   Broke out with acne all over body.   Tylenol [acetaminophen] Nausea And Vomiting        Medication List     STOP taking these medications    promethazine 25 MG tablet Commonly known as: PHENERGAN       TAKE these medications    ALPRAZolam 0.5 MG tablet Commonly known as: XANAX Take 0.5 mg by mouth at bedtime as needed for anxiety.   Blood Pressure Monitor Misc For regular home bp monitoring during pregnancy   ferrous sulfate 325 (65 FE) MG tablet Take 1 tablet (325 mg total) by mouth every other day.   gabapentin 100 MG capsule Commonly known as: NEURONTIN Take 2 capsules (200 mg  total) by mouth 2 (two) times daily.   ibuprofen 600 MG tablet Commonly known as: ADVIL Take 1 tablet (600 mg total) by mouth every 6 (six) hours as needed for mild pain, headache, fever or moderate pain.   ondansetron 4 MG disintegrating tablet Commonly known as: Zofran ODT Take 1 tablet (4 mg total) by mouth every 8 (eight) hours as needed for nausea or vomiting.   Oxycodone HCl 10 MG Tabs Take 1 tablet (10 mg total) by mouth every 6 (six) hours as needed for severe pain.   pantoprazole 20 MG tablet Commonly known as: Protonix Take 1 tablet (20 mg total) by mouth 2 (two) times daily before a meal.   Prenatal Vitamins 28-0.8 MG Tabs Take 1 tablet by mouth daily.   senna-docusate 8.6-50 MG tablet Commonly known as: Senokot-S Take 1-2 tablets by mouth 2 (two) times daily as needed for mild constipation or moderate constipation.               Discharge Care Instructions  (From admission, onward)           Start     Ordered   07/27/21 0000  Discharge wound care:       Comments: As per discharge handout and nursing instructions   07/27/21 0644             Discharge home in stable condition Infant Feeding: Bottle and Breast Infant Disposition:NICU Discharge instruction: per After Visit Summary and Postpartum booklet. Activity: Advance as tolerated. Pelvic rest for 6 weeks.  Diet: routine diet Future Appointments: Future Appointments  Date Time Provider [place]  07/31/2021  1:50 PM Florian Buff, MD [facility]  08/06/2021  1:50 PM Roma Schanz, CNM [facility]  08/28/2021 11:10 AM Myrtis Ser, CNM [facility]   Follow up Visit:  Message sent to [facility] by Dr Higinio Plan on 07/24/2021:  Please schedule this patient for a In person postpartum visit in 4 weeks with the following provider: Any provider. Additional Postpartum F/U:Postpartum Depression checkup and Incision check 1 week  High risk pregnancy complicated by:  H/o Hep C,  anxiety/depression on xanax, previous CS Delivery mode:  C-Section, Low Transverse  Anticipated Birth Control:  B/l Salpingectomy performed    07/27/2021 Verita Schneiders, MD

## 2021-07-25 LAB — CBC
HCT: 23.5 % — ABNORMAL LOW (ref 36.0–46.0)
Hemoglobin: 7.9 g/dL — ABNORMAL LOW (ref 12.0–15.0)
MCH: 28.6 pg (ref 26.0–34.0)
MCHC: 33.6 g/dL (ref 30.0–36.0)
MCV: 85.1 fL (ref 80.0–100.0)
Platelets: 139 10*3/uL — ABNORMAL LOW (ref 150–400)
RBC: 2.76 MIL/uL — ABNORMAL LOW (ref 3.87–5.11)
RDW: 12.6 % (ref 11.5–15.5)
WBC: 13.9 10*3/uL — ABNORMAL HIGH (ref 4.0–10.5)
nRBC: 0 % (ref 0.0–0.2)

## 2021-07-25 MED ORDER — FERROUS SULFATE 325 (65 FE) MG PO TABS
325.0000 mg | ORAL_TABLET | ORAL | Status: DC
  Administered 2021-07-25 – 2021-07-27 (×2): 325 mg via ORAL
  Filled 2021-07-25 (×2): qty 1

## 2021-07-25 MED ORDER — ALPRAZOLAM 0.5 MG PO TABS
0.5000 mg | ORAL_TABLET | Freq: Three times a day (TID) | ORAL | Status: DC | PRN
  Administered 2021-07-25 – 2021-07-27 (×8): 0.5 mg via ORAL
  Filled 2021-07-25 (×8): qty 1

## 2021-07-25 NOTE — Clinical Social Work Maternal (Signed)
CLINICAL SOCIAL WORK MATERNAL/CHILD NOTE  Patient Details  Name: Jacqueline Ellison MRN: [id number] Date of Birth: Feb 28, 1996  Date:  07/25/2021  Clinical Social Worker Initiating Note:  Abundio Miu, [place] Date/Time: Initiated:  07/25/21/1518     Child's Name:  Lenn Cal "Ropyr" Dorothyann Peng   Biological Parents:  Mother   Need for Interpreter:  None   Reason for Referral:  Behavioral Health Concerns, Other (Comment) (Infant's NICU admission)   Address:  [address] [place] [address]   Mailing Address: [place], [place] [address]   Phone number:  [phone] (home)     Additional phone number:   Household Members/Support Persons (HM/SP):   Household Member/Support Person 1   HM/SP Name Relationship DOB or Age  HM/SP -1 Ashly Yepez daughter 04/25/16  HM/SP -2        HM/SP -3        HM/SP -4        HM/SP -5        HM/SP -6        HM/SP -7        HM/SP -8          Natural Supports (not living in the home):  Parent, Immediate Family   Professional Supports: Therapist   Employment: Full-time   Type of Work: Probation officer   Education:  [place] arranged:    Museum/gallery curator Resources:  Medicaid   Other Resources:  Physicist, medical   (Has not had [facility] appointment)   Cultural/Religious Considerations Which May Impact Care:    Strengths:  Ability to meet basic needs  , Psychotropic Medications, Pediatrician chosen, Understanding of illness   Psychotropic Medications:  Xanax      Pediatrician:    [facility]  Pediatrician List:   [place]      Pediatrician Fax Number:    Risk Factors/Current Problems:  Mental Health Concerns     Cognitive State:  Able to Concentrate  , Alert  , Linear Thinking  , Insightful  , Loosening of Associations     Mood/Affect:  Calm  , Flat  , Interested     CSW Assessment: CSW met with MOB  at bedside, MOB's mother present. CSW introduced self and explained role. MOB granted CSW verbal permission to speak about anything with MOB's mother present. 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 MOB reported that she receives food stamps and missed her [facility] appointment due to going into delivery. MOB reported that she has some items for infant including a basinet. CSW informed MOB about [facility]'s [employer] if any assistance is needed obtaining items for infant. MOB reported that assistance with all essential items would be helpful. CSW agreed to make referral. CSW informed MOB about the hospital car seat program if any assistance is needed obtaining a car seat, MOB verbalized understanding. CSW inquired about MOB's support system, MOB reported that her mom and grandma are supports.  CSW inquired about MOB's mental health history, MOB reported that she was diagnosed with anxiety at age 25. MOB reported that she is currently taking Xanax to treat her anxiety that is managed by her PCP at [facility]. MOB reported that the medication is helpful. MOB reported that she has a therapist but has not had  the time to go. MOB reported that her anxiety has been high due to infant's NICU admission and a lot going on in general. 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 CSW spoke with MOB about the importance of getting rest when she is able as MOB spoke about being tired. MOB endorsed a history of postpartum depression, noting it lasted about 4 months. MOB described her postpartum depression as being angry. MOB reported that they restarted her on her Xanax. MOB shared about experiencing life stressors during this time period and as her stressors improved her symptoms improved. MOB denied any additional mental health history. CSW inquired about how MOB was feeling emotionally after giving birth, MOB reported "not  good". CSW acknowledged and validated MOB's feelings. CSW and MOB discussed emotions associated with NICU admissions being (overwhelmed, worried, etc). MOB presented calm and did not demonstrate any acute mental health signs/symptoms. CSW assessed for safety, MOB denied SI, HI, and domestic violence.   CSW provided education regarding the baby blues period vs. perinatal mood disorders, discussed treatment and gave resources for mental health follow up if concerns arise.  CSW recommends self-evaluation during the postpartum time period using the New Mom Checklist from Postpartum Progress and encouraged MOB to contact a medical professional if symptoms are noted at any time.    CSW provided review of Sudden Infant Death Syndrome (SIDS) precautions.    CSW and MOB discussed infant's NICU admission. CSW informed MOB about the NICU, what to expect, and resources/supports available while infant is admitted to the NICU. MOB reported that she feels informed about infant's care but has only been to visit infant once due to it being hard to leave him. CSW acknowledged and validated the difficulties associated with leaving infant in the NICU. CSW encouraged MOB to find what routine will work best for her while adapting to infant being in the NICU. MOB denied any transportation barriers with visiting infant in the NICU. MOB denied any questions/concerns regarding the NICU.   CSW will continue to offer resources/supports while infant is admitted to the NICU.   CSW made referral to [facility] for requested items.   CSW Plan/Description:  Sudden Infant Death Syndrome (SIDS) Education, Perinatal Mood and Anxiety Disorder (PMADs) Education, Psychosocial Support and Ongoing Assessment of Needs, Other Patient/Family Education, Other Information/Referral to [employer], [place] 07/25/2021, 3:21 PM

## 2021-07-25 NOTE — Progress Notes (Signed)
Pt educated and offered information regarding MMR vaccine due to patient's rubella nonimmune status. Pt requests to look over information before consenting to receive the vaccine.

## 2021-07-25 NOTE — Progress Notes (Signed)
Subjective: Postpartum Day 1: Cesarean Delivery Patient reports incisional pain, tolerating PO, and no problems voiding.    Objective: Vital signs in last 24 hours: Temp:  [96.8 F (36 C)-98 F (36.7 C)] 97.8 F (36.6 C) (11/10 0754) Pulse Rate:  [55-99] 69 (11/10 0754) Resp:  [11-18] 18 (11/10 0754) BP: (94-113)/(52-71) 98/52 (11/10 0754) SpO2:  [97 %-100 %] 97 % (11/10 0754)  Physical Exam:  General: alert, cooperative, and no distress Lochia: appropriate Uterine Fundus: firm Incision: healing well DVT Evaluation: No evidence of DVT seen on physical exam.  Recent Labs    07/24/21 0741 07/25/21 0428  HGB 10.7* 7.9*  HCT 30.8* 23.5*    Assessment/Plan: Status post Cesarean section. Doing well postoperatively.  Continue current care.  Jacqueline Ellison 07/25/2021, 11:18 AM

## 2021-07-25 NOTE — Lactation Note (Signed)
This note was copied from a baby's chart. Lactation Consultation Note  Patient Name: Jacqueline Ellison [id number]'C Date: 07/25/2021 Reason for consult: Follow-up assessment;NICU baby;Late-preterm 34-36.6wks Age:27 hours  Lactation followed up with [name]. She reports normal milk volume for 30 hours postpartum. She states that she has had an issue with breast milk leaking under her flange when pumping and leaking through top of bottle. Her RN brought her some replacement storage bottles, and I recommended that she lean forward to pump.  Social Work was present, and I inadvertently interrupted their consult. I recommend that lactation follow up tomorrow. Patient may benefit from a belly band as a DIY pumping bra. I followed up with her RN about this.   Feeding Mother's Current Feeding Choice: Breast Milk   Lactation Tools Discussed/Used Tools: Flanges;Pump Flange Size: 24 Breast pump type: Double-Electric Breast Pump Pump Education: Setup, frequency, and cleaning Reason for Pumping: NICU Pumping frequency: q3 hours (recommended) Pumped volume: 3 mL  Interventions Interventions: Breast feeding basics reviewed;DEBP;Education  Discharge    Consult Status Consult Status: Follow-up Date: 07/25/21 Follow-up type: In-patient    Walker Shadow 07/25/2021, 3:02 PM

## 2021-07-26 ENCOUNTER — Other Ambulatory Visit: Payer: Self-pay

## 2021-07-26 MED ORDER — HYDROMORPHONE HCL 1 MG/ML IJ SOLN: 1.0000 mg | INTRAMUSCULAR | Status: DC | PRN

## 2021-07-26 MED ORDER — HYDROMORPHONE HCL 2 MG PO TABS
2.0000 mg | ORAL_TABLET | ORAL | Status: DC | PRN
Start: 2021-07-26 — End: 2021-07-26

## 2021-07-26 MED ORDER — OXYCODONE HCL 5 MG PO TABS
10.0000 mg | ORAL_TABLET | ORAL | Status: DC | PRN
Start: 2021-07-26 — End: 2021-07-27
  Administered 2021-07-26 (×2): 15 mg via ORAL
  Administered 2021-07-26: 10 mg via ORAL
  Administered 2021-07-27 (×4): 15 mg via ORAL
  Filled 2021-07-26: qty 2
  Filled 2021-07-26 (×6): qty 3

## 2021-07-26 MED ORDER — OXYCODONE HCL 5 MG PO TABS
5.0000 mg | ORAL_TABLET | Freq: Once | ORAL | Status: AC
  Administered 2021-07-26: 5 mg via ORAL
  Filled 2021-07-26: qty 1

## 2021-07-26 MED ORDER — GABAPENTIN 100 MG PO CAPS
200.0000 mg | ORAL_CAPSULE | Freq: Two times a day (BID) | ORAL | Status: DC
  Administered 2021-07-26 – 2021-07-27 (×3): 200 mg via ORAL
  Filled 2021-07-26 (×3): qty 2

## 2021-07-26 MED ORDER — POLYETHYLENE GLYCOL 3350 17 G PO PACK: 17.0000 g | PACK | Freq: Every day | ORAL | Status: DC

## 2021-07-26 NOTE — Lactation Note (Signed)
This note was copied from a baby's chart.  NICU Lactation Consultation Note  Patient Name: Jacqueline Ellison [id number] Date: 07/26/2021 Age:27 hours   Subjective Reason for consult: Follow-up assessment Mother continues to pump frequently and has + breast changes today. She noticed a small amount of blood from her L nipple today at the site of a previous nipple piercing. She is aware to discard the milk from that side until the wound heals because of her + hep C dx.  Mother declined offer to provide ice packs for s/s of engorgement today.  Objective Infant data: Mother's Current Feeding Choice: Breast Milk and Formula  Infant feeding assessment Scale for Readiness: 3   Maternal data: [id number]  C-Section, Low Transverse   Pumping frequency: q3 10+mls per pump Pumped volume: 3 mL Flange Size: 24   Assessment Maternal: Milk volume: Normal Mother has early signs of engorgement today.  Intervention/Plan Interventions: Breast feeding basics reviewed; DEBP; Education  Tools: Comfort gels  Plan: Consult Status: Follow-up  NICU Follow-up type: Verify absence of engorgement; Verify onset of copious milk  Mother to continue pumping q 2-3 and discard milk from L breast until wound heals. Mother to apply ice to breasts prior to pumping for engorgement relief. Mother to wear hydrogel for wound healing.   [name] 07/26/2021, 10:30 AM

## 2021-07-26 NOTE — Progress Notes (Signed)
Daily Postpartum Note  Admission Date: 07/24/2021 Current Date: 07/26/2021 8:40 AM  Jacqueline Ellison is a 25 y.o. [id number] POD#2 s/p elective rpt LTCS and bilateral salpingectomy at 35wks due to PTL and abruption.  Pregnancy complicated by: Patient Active Problem List   Diagnosis Date Noted   S/P repeat low transverse C-section 07/24/2021   Status post bilateral salpingectomy 07/24/2021   Rubella non-immune status, antepartum 07/02/2021   Request for sterilization 06/12/2021   Encounter for supervision of normal pregnancy, antepartum 04/03/2021   Previous cesarean section 07/02/2018   Hepatitis C 07/16/2017   Postpartum depression 07/02/2016   Depression 01/14/2016   possible history of hypothyroidism as a child    Arthritis    Anxiety     Overnight/24hr events:  none  Subjective:  Patient states oxycodone is helping some but needs higher dose. +BM and passing gas, no n/v.   Objective:    Current Vital Signs 24h Vital Sign Ranges  T 97.8 F (36.6 C) Temp  Avg: 98 F (36.7 C)  Min: 97.8 F (36.6 C)  Max: 98.1 F (36.7 C)  BP (!) 96/57  BP  Min: 96/57  Max: 116/64  HR 86 Pulse  Avg: 89  Min: 86  Max: 93  RR 18 Resp  Avg: 18.2  Min: 17  Max: 20  SaO2 96 %  (Room Air) SpO2  Avg: 96.3 %  Min: 94 %  Max: 98 %       24 Hour I/O Current Shift I/O  Time Ins Outs No intake/output data recorded. No intake/output data recorded.   Physical exam: General: Well nourished, well developed female in no acute distress. Abdomen: c/d/I honeycomb dressing. Moderate distension, appropriately ttp, +BS Cardiovascular: S1, S2 normal, no murmur, rub or gallop, regular rate and rhythm Respiratory: CTAB Extremities: no clubbing, cyanosis or edema Skin: Warm and dry.   Medications: Current Facility-Administered Medications  Medication Dose Route Frequency Provider Last Rate Last Admin   ALPRAZolam Prudy Feeler) tablet 0.5 mg  0.5 mg Oral TID PRN Lazaro Arms, MD   0.5 mg at 07/26/21 0631    coconut oil  1 application Topical PRN Federico Flake, MD   1 application at 07/25/21 0205   witch hazel-glycerin (TUCKS) pad 1 application  1 application Topical PRN Federico Flake, MD       And   dibucaine (NUPERCAINAL) 1 % rectal ointment 1 application  1 application Rectal PRN Federico Flake, MD       diphenhydrAMINE (BENADRYL) injection 12.5 mg  12.5 mg Intravenous Q4H PRN Lucretia Kern, MD       Or   diphenhydrAMINE (BENADRYL) capsule 25 mg  25 mg Oral Q4H PRN Lucretia Kern, MD       diphenhydrAMINE (BENADRYL) capsule 25 mg  25 mg Oral Q6H PRN Federico Flake, MD       ferrous sulfate tablet 325 mg  325 mg Oral Lottie Mussel, MD   325 mg at 07/25/21 1742   gabapentin (NEURONTIN) capsule 200 mg  200 mg Oral BID Ken Caryl Bing, MD       ibuprofen (ADVIL) tablet 600 mg  600 mg Oral Q6H Federico Flake, MD   600 mg at 07/26/21 1761   menthol-cetylpyridinium (CEPACOL) lozenge 3 mg  1 lozenge Oral Q2H PRN Federico Flake, MD       nalbuphine (NUBAIN) injection 5 mg  5 mg Intravenous Q4H PRN Lucretia Kern, MD  Or   nalbuphine (NUBAIN) injection 5 mg  5 mg Subcutaneous Q4H PRN Lucretia Kern, MD       naloxone [facility]) injection 0.4 mg  0.4 mg Intravenous PRN Lucretia Kern, MD       And   sodium chloride flush (NS) 0.9 % injection 3 mL  3 mL Intravenous PRN Lucretia Kern, MD       naloxone HCl (NARCAN) 2 mg in dextrose 5 % 250 mL infusion  1-4 mcg/kg/hr Intravenous Continuous PRN Lucretia Kern, MD       ondansetron [facility]) injection 4 mg  4 mg Intravenous Q8H PRN Lucretia Kern, MD       ondansetron (ZOFRAN-ODT) disintegrating tablet 4 mg  4 mg Oral Q8H PRN Federico Flake, MD       oxyCODONE (Oxy IR/ROXICODONE) immediate release tablet 10-15 mg  10-15 mg Oral Q4H PRN [place] Bing, MD       oxyCODONE (Oxy IR/ROXICODONE) immediate release tablet 5 mg  5 mg Oral Once [place] Bing, MD        polyethylene glycol (MIRALAX / GLYCOLAX) packet 17 g  17 g Oral Q supper [place] Bing, MD       prenatal multivitamin tablet 1 tablet  1 tablet Oral Q1200 Federico Flake, MD   1 tablet at 07/25/21 0936   scopolamine (TRANSDERM-SCOP) 1 MG/3DAYS 1.5 mg  1 patch Transdermal Once Lucretia Kern, MD       senna-docusate (Senokot-S) tablet 2 tablet  2 tablet Oral Q24H Federico Flake, MD       simethicone [facility]) chewable tablet 80 mg  80 mg Oral TID PC Federico Flake, MD   80 mg at 07/25/21 1741   zolpidem (AMBIEN) tablet 5 mg  5 mg Oral QHS PRN Federico Flake, MD        Labs:  Recent Labs  Lab 07/24/21 0741 07/25/21 0428  WBC 12.1* 13.9*  HGB 10.7* 7.9*  HCT 30.8* 23.5*  PLT 178 139*     Radiology:  No new imaging  Assessment & Plan:  Pt stable *PP: routine care. Asymptomatic from anemia. Pt desires PO isntead of IV iron. Oxycodone from 5-10 to 10-15mg  pain scale is what patient prefers over switching to dilaudid. Lovenox. A POS. Desires circ, baby in nicu. R/b/ d/w mom re: circ and she desires circ Home likely tomorrow.   Cornelia Copa MD Attending Center for [facility] GYN Consult Phone: [phone] (M-F, 0800-1700) & [phone]  (Off hours, weekends, holidays)

## 2021-07-27 MED ORDER — GABAPENTIN 100 MG PO CAPS
200.0000 mg | ORAL_CAPSULE | Freq: Two times a day (BID) | ORAL | 1 refills | Status: DC
Start: 2021-07-27 — End: 2021-08-28

## 2021-07-27 MED ORDER — OXYCODONE HCL 10 MG PO TABS: 10.0000 mg | ORAL_TABLET | Freq: Four times a day (QID) | ORAL | 0 refills | Status: DC | PRN

## 2021-07-27 MED ORDER — MEASLES, MUMPS & RUBELLA VAC IJ SOLR
0.5000 mL | Freq: Once | INTRAMUSCULAR | Status: AC
  Administered 2021-07-27: 0.5 mL via SUBCUTANEOUS
  Filled 2021-07-27: qty 0.5

## 2021-07-27 MED ORDER — FERROUS SULFATE 325 (65 FE) MG PO TABS: 325.0000 mg | ORAL_TABLET | ORAL | 3 refills | Status: DC

## 2021-07-27 MED ORDER — SENNOSIDES-DOCUSATE SODIUM 8.6-50 MG PO TABS: 1.0000 | ORAL_TABLET | Freq: Two times a day (BID) | ORAL | 2 refills | Status: DC | PRN

## 2021-07-27 MED ORDER — IBUPROFEN 600 MG PO TABS: 600.0000 mg | ORAL_TABLET | Freq: Four times a day (QID) | ORAL | 2 refills | Status: DC | PRN

## 2021-07-27 NOTE — Lactation Note (Signed)
This note was copied from a baby's chart.  NICU Lactation Consultation Note  Patient Name: Jacqueline Ellison HALPF'X Date: 07/27/2021 Age:25 years   Subjective Reason for consult: Follow-up assessment; Maternal discharge Mother continues to pump often. Her breasts are still heavy but she denies discomfort. Mother is no longer seeing blood from L nipple.   Objective Infant data: Mother's Current Feeding Choice: Breast Milk and Formula  Infant feeding assessment Scale for Readiness: 4   Maternal data: [id number]  C-Section, Low Transverse   Pumping frequency: q3 Pumped volume: 40 mL   Pump: Manual (Mother will d/c today with a manual pump. She expects her pump to arrive from [place] today.)   Assessment Maternal: Milk volume: Normal Breasts are full but not engorged today.   Intervention/Plan Interventions: Education; Infant Driven Feeding Algorithm education; Ice  Tools: Comfort gels  Plan: Consult Status: Follow-up  NICU Follow-up type: Verify absence of engorgement; Weekly NICU follow up  Mother will continue to pump frequently. She will use ice prn prior to pumping.   Jacqueline Ellison 07/27/2021, 12:05 PM

## 2021-07-27 NOTE — Plan of Care (Signed)

## 2021-07-29 LAB — SURGICAL PATHOLOGY

## 2021-07-30 ENCOUNTER — Ambulatory Visit: Payer: Self-pay

## 2021-07-30 NOTE — Lactation Note (Signed)
This note was copied from a baby's chart. Lactation Consultation Note  Patient Name: Jacqueline Ellison HUTML'Y Date: 07/30/2021 Reason for consult: Follow-up assessment;NICU baby;Late-preterm 34-36.6wks;Infant < 6lbs Age:27 days  Visited with mom of 56 days old LPI NICU female, she reports full onset of lactogenesis and denies engorgement. She also voiced that her left nipple is no longer bleeding.   Noticed that baby has been going to breast, last LATCH score was 8. Mom said she feels comfortable enough to do it on her own without assistance, she's a P2. Asked mom to call NICU LC if she needs assistance at any time.  Reviewed pumping schedule and frequency, mom aware that she should be pumping 8 times/24 hours; she said said she's trying her best to keep up.  Maternal Data  Unable to calculate mom's supply due to not having frequency, baby is getting both, breastmilk and Similac 24 calorie formula  Feeding Mother's Current Feeding Choice: Breast Milk and Formula  Lactation Tools Discussed/Used Tools: Pump;Flanges Flange Size: 24 Breast pump type: Double-Electric Breast Pump Pump Education: Setup, frequency, and cleaning;Milk Storage Reason for Pumping: LPI in NICU Pumping frequency: Mom didn't specify frequency when asked (twice) she said she's trying to do her best to pump as much as she can Pumped volume: 120 mL  Interventions Interventions: Breast feeding basics reviewed;DEBP;Education  Plan of care   Encouraged mom to start pumping consistently every 3 hours, at least 8 pumping sessions/24 hours She'll call NICU LC for assistance PRN   No other support person at this time. All questions and concerns answered, mom to call NICU LC PRN.  Discharge Pump: Personal;DEBP (Mom said she has a DEBP for home use)  Consult Status Consult Status: Follow-up Date: 07/30/21 Follow-up type: In-patient   Alina Gilkey Venetia Constable 07/30/2021, 2:26 PM

## 2021-07-31 ENCOUNTER — Ambulatory Visit (INDEPENDENT_AMBULATORY_CARE_PROVIDER_SITE_OTHER): Payer: Medicaid Other | Admitting: Obstetrics & Gynecology

## 2021-07-31 ENCOUNTER — Other Ambulatory Visit: Payer: Self-pay

## 2021-07-31 ENCOUNTER — Encounter: Payer: Self-pay | Admitting: Obstetrics & Gynecology

## 2021-07-31 VITALS — BP 113/77 | HR 93 | Ht 61.0 in | Wt 116.0 lb

## 2021-07-31 DIAGNOSIS — Z98891 History of uterine scar from previous surgery: Secondary | ICD-10-CM

## 2021-07-31 MED ORDER — OXYCODONE HCL 5 MG PO TABS: 5.0000 mg | ORAL_TABLET | ORAL | 0 refills | Status: DC | PRN

## 2021-07-31 MED ORDER — IBUPROFEN 600 MG PO TABS: 600.0000 mg | ORAL_TABLET | Freq: Four times a day (QID) | ORAL | 2 refills | Status: DC | PRN

## 2021-07-31 NOTE — Progress Notes (Signed)
  HPI: Patient returns for routine postoperative follow-up having undergone repeat Caesarean section + BTL on 07/24/21.  The patient's immediate postoperative recovery has been unremarkable. Since hospital discharge the patient reports incisional pain.   Current Outpatient Medications: ALPRAZolam (XANAX) 0.5 MG tablet, Take 0.5 mg by mouth at bedtime as needed for anxiety., Disp: , Rfl:  Blood Pressure Monitor MISC, For regular home bp monitoring during pregnancy, Disp: 1 each, Rfl: 0 ferrous sulfate 325 (65 FE) MG tablet, Take 1 tablet (325 mg total) by mouth every other day., Disp: 30 tablet, Rfl: 3 gabapentin (NEURONTIN) 100 MG capsule, Take 2 capsules (200 mg total) by mouth 2 (two) times daily., Disp: 30 capsule, Rfl: 1 ondansetron (ZOFRAN ODT) 4 MG disintegrating tablet, Take 1 tablet (4 mg total) by mouth every 8 (eight) hours as needed for nausea or vomiting., Disp: 30 tablet, Rfl: 1 oxyCODONE (ROXICODONE) 5 MG immediate release tablet, Take 1 tablet (5 mg total) by mouth every 4 (four) hours as needed for severe pain., Disp: 30 tablet, Rfl: 0 oxyCODONE 10 MG TABS, Take 1 tablet (10 mg total) by mouth every 6 (six) hours as needed for severe pain., Disp: 30 tablet, Rfl: 0 pantoprazole (PROTONIX) 20 MG tablet, Take 1 tablet (20 mg total) by mouth 2 (two) times daily before a meal., Disp: 60 tablet, Rfl: 3 Prenatal Vit-Fe Fumarate-FA (PRENATAL VITAMINS) 28-0.8 MG TABS, Take 1 tablet by mouth daily., Disp: 90 tablet, Rfl: 4 senna-docusate (SENOKOT-S) 8.6-50 MG tablet, Take 1-2 tablets by mouth 2 (two) times daily as needed for mild constipation or moderate constipation., Disp: 30 tablet, Rfl: 2 ibuprofen (ADVIL) 600 MG tablet, Take 1 tablet (600 mg total) by mouth every 6 (six) hours as needed for mild pain, headache, fever or moderate pain., Disp: 60 tablet, Rfl: 2  No current facility-administered medications for this visit.    Blood pressure 113/77, pulse 93, height 5\' 1"  (1.549 m),  weight 116 lb (52.6 kg), currently breastfeeding.  Physical Exam: Incision clean dry intact Normal abdominal exam  Diagnostic Tests:   Pathology: Normal tubes  Impression:   ICD-10-CM   1. S/P cesarean section  Z98.891         Plan: Orders Placed This Encounter     oxyCODONE (ROXICODONE) 5 MG immediate release tablet         Sig: Take 1 tablet (5 mg total) by mouth every 4 (four) hours as needed for severe pain.         Dispense:  30 tablet         Refill:  0     ibuprofen (ADVIL) 600 MG tablet         Sig: Take 1 tablet (600 mg total) by mouth every 6 (six) hours as needed for mild pain, headache, fever or moderate pain.         Dispense:  60 tablet         Refill:  2    Follow up: 08/28/21 PP appt   08/30/21, MD

## 2021-08-04 ENCOUNTER — Other Ambulatory Visit: Payer: Self-pay | Admitting: Women's Health

## 2021-08-04 ENCOUNTER — Other Ambulatory Visit: Payer: Self-pay

## 2021-08-04 ENCOUNTER — Inpatient Hospital Stay (HOSPITAL_COMMUNITY)
Admission: AD | Admit: 2021-08-04 | Discharge: 2021-08-04 | Disposition: A | Discharge status: Home / Self Care | Payer: Medicaid Other | Source: Ambulatory Visit | Attending: Obstetrics & Gynecology | Admitting: Obstetrics & Gynecology

## 2021-08-04 ENCOUNTER — Other Ambulatory Visit: Payer: Self-pay | Admitting: Certified Nurse Midwife

## 2021-08-04 DIAGNOSIS — Z791 Long term (current) use of non-steroidal anti-inflammatories (NSAID): Secondary | ICD-10-CM | POA: Diagnosis not present

## 2021-08-04 DIAGNOSIS — O9122 Nonpurulent mastitis associated with the puerperium: Secondary | ICD-10-CM | POA: Insufficient documentation

## 2021-08-04 DIAGNOSIS — N644 Mastodynia: Secondary | ICD-10-CM | POA: Diagnosis not present

## 2021-08-04 MED ORDER — DICLOXACILLIN SODIUM 250 MG PO CAPS
500.0000 mg | ORAL_CAPSULE | Freq: Once | ORAL | Status: AC
  Administered 2021-08-04: 500 mg via ORAL
  Filled 2021-08-04 (×2): qty 1
  Filled 2021-08-04: qty 2

## 2021-08-04 MED ORDER — OXYCODONE HCL 5 MG PO TABS: 5.0000 mg | ORAL_TABLET | Freq: Three times a day (TID) | ORAL | 0 refills | Status: DC | PRN

## 2021-08-04 MED ORDER — DICLOXACILLIN SODIUM 500 MG PO CAPS: 500.0000 mg | ORAL_CAPSULE | Freq: Four times a day (QID) | ORAL | 0 refills | Status: DC

## 2021-08-04 MED ORDER — OXYCODONE HCL 5 MG PO TABS
5.0000 mg | ORAL_TABLET | Freq: Once | ORAL | Status: AC
  Administered 2021-08-04: 5 mg via ORAL
  Filled 2021-08-04: qty 1

## 2021-08-04 MED ORDER — DICLOXACILLIN SODIUM 500 MG PO CAPS
500.0000 mg | ORAL_CAPSULE | Freq: Four times a day (QID) | ORAL | 0 refills | Status: AC
Start: 2021-08-04 — End: 2021-08-18

## 2021-08-04 NOTE — Progress Notes (Signed)
Pt called, requesting Rx sent to pharmacy in [place]. Rx sent.

## 2021-08-04 NOTE — MAU Note (Signed)
Redness 9-12 o'clock area on left breast, very swollen.

## 2021-08-04 NOTE — Lactation Note (Signed)
This note was copied from a baby's chart. Lactation Consultation Note  Patient Name: Boy Quinnetta Roepke [id number] Date: 08/04/2021 Reason for consult: Follow-up assessment;NICU baby;Late-preterm 34-36.6wks;Infant < 6lbs;Other (Comment);Mother's request;Nipple pain/trauma (Hep C (+) and benzos (+) an L2) Age:27 days  Mom called for assistance because she was having breast pain, her left breast showed signs of engorgement and a red streak toward the midsection of upper left/right quadrant that might be signal mastitis, the breast in question kept leaking during [facility] consultation.  LC asked about engorgement management and mom voiced she's been pumping every 3 hours and also icing the breast every 3 hours; she's also been taking her ibuprofen round the clock every 8 hours, and her breast has shown zero improvement.  Since mom has done everything for engorgement management the last 5 days (Tuesday is when the pain started), she was referred her to MAU.   Mom told this LC that when she went to her OBGYN, he won't even look at the breast and told her to do some vitamin E oil on her nipples that were bleeding that day (mom discarded the milk due to Hepatitis C + status).  Maternal Data  Mom's supply is WNL  Feeding Mother's Current Feeding Choice: Breast Milk and Formula  LATCH Score Latch: Too sleepy or reluctant, no latch achieved, no sucking elicited.  Audible Swallowing: None  Type of Nipple: Everted at rest and after stimulation  Comfort (Breast/Nipple): Soft / non-tender  Hold (Positioning): No assistance needed to correctly position infant at breast.  LATCH Score: 6  Lactation Tools Discussed/Used Tools: Pump;Flanges Flange Size: 24 Breast pump type: Double-Electric Breast Pump Reason for Pumping: LPI in NICU Pumping frequency: q 3 hours (per mom), but per NICU RN [place], baby is getting formula because they run out of breastmilk Pumped volume: 90  mL  Interventions Interventions: Breast feeding basics reviewed;Breast massage;DEBP;Education  Plan of care   Encouraged mom to continue pumping consistently every 3 hours, at least 8 pumping sessions/24 hours She'll go to MAU to seek medical attention for a potential mastitis  LC got ice packs and encouraged mom to continue following steps to treat engorgement.  I recommend observation to ensure that she's really icing the breasts and pumping every 3 hours. No other support person at this time. All questions and concerns answered, mom to call NICU LC PRN.  Discharge Pump: DEBP;Personal  Consult Status Consult Status: Follow-up Date: 08/04/21 Follow-up type: In-patient   Oryn Casanova Venetia Constable 08/04/2021, 7:01 PM

## 2021-08-04 NOTE — MAU Note (Signed)
C/s on 11/9.  Had tubal.  Lactation nurse her down from the NICU, "has mastitis in the left boob".  Told OB on Wed, didn't look at it, he just decreased the dose and gave her enough pain medication for 4 days, told her take more Ibuprofen. Left breast is bruised with red streaks, noted about 5-6 days ago.golf ball sized lumps and hot spots. Denies fever.  Pain has gotten 10 times since she saw the dr. No clogged ducts, is able to pump, getting breast milk- that part is fine

## 2021-08-04 NOTE — MAU Provider Note (Signed)
History     CSN: [id number]  Arrival date and time: 08/04/21 1819   Event Date/Time   First Provider Initiated Contact with Patient 08/04/21 1910      Chief Complaint  Patient presents with   mastitis   Ms. Jacqueline Ellison is a 25 y.o. [phone] at postpartum who presents to MAU for evaluation of possible mastitis. Patient reports she was upstairs in the NICU trying to breastfeed her baby and was sent down to MAU for evaluation by nursing staff. Patient states symptoms started on 07/29/2021 with breast pain, redness and looking "bruised"  on her left breast. Patient states she had an appointment on 07/31/2021 with her OB, but states the breast was not examined and she was prescribed oxycodone for pain, told to increase her ibuprofen usage and advised to use Vitamin E gel on her nipples. Patient states pain has gotten worse since that time, and the breast has become more red and swollen. Patient denies fever or chills. Patient reports 10/10 pain at this time.  Pt denies vaginal discharge/odor/itching. Pt denies N/V, abdominal pain, constipation, diarrhea, or urinary problems. Pt denies fever, chills, fatigue, sweating or changes in appetite. Pt denies SOB or chest pain. Pt denies dizziness, HA, light-headedness, weakness.  Allergies? NKDA Current medications/supplements? Ibuprofen, oxycodone Pregnant/postpartum/breastfeeding? breastfeeding Prenatal care provider? [facility], next appt 08/06/2021   OB History     Gravida  3   Para  2   Term  1   Preterm  1   AB  1   Living  2      SAB  1   IAB      Ectopic      Multiple  0   Live Births  2           Past Medical History:  Diagnosis Date   Anxiety    Arthritis    Chlamydia infection    Hepatitis C 07/16/2017   Marijuana use 01/14/2016   Counseled 01/14/2016    Repeat:___   Mental disorder    depression    Past Surgical History:  Procedure Laterality Date   BLADDER SURGERY     bladder stretch 2001?    CESAREAN SECTION N/A 04/25/2016   Procedure: CESAREAN SECTION;  Surgeon: Reva Bores, MD;  Location: [facility];  Service: Obstetrics;  Laterality: N/A;   CESAREAN SECTION N/A 07/24/2021   Procedure: CESAREAN SECTION;  Surgeon: Catalina Antigua, MD;  Location: [facility] LD ORS;  Service: Obstetrics;  Laterality: N/A;   HERNIA REPAIR     HERNIA REPAIR  2009   WISDOM TOOTH EXTRACTION  2009    Family History  Problem Relation Age of Onset   Hypertension Mother    Diabetes Mother    Hypertension Father    Hypertension Sister    Heart attack Maternal Uncle    Hypertension Maternal Grandmother    Diabetes Maternal Grandmother    Hypertension Maternal Grandfather    Aneurysm Maternal Grandfather    Hypertension Paternal Grandmother    Hypertension Paternal Grandfather    Heart disease Paternal Grandfather     Social History   Tobacco Use   Smoking status: Former    Packs/day: 0.25    Years: 5.00    Pack years: 1.25    Types: Cigarettes    Quit date: 06/15/2021    Years since quitting: 0.1   Smokeless tobacco: Never  Vaping Use   Vaping Use: Former  Substance Use Topics   Alcohol use: No  Drug use: No    Comment: Marijuana prior to pregnancy    Allergies:  Allergies  Allergen Reactions   Depo-Provera [Medroxyprogesterone Acetate] Other (See Comments)    Broke out with acne all over body.   Tylenol [Acetaminophen] Nausea And Vomiting    Medications Prior to Admission  Medication Sig Dispense Refill Last Dose   ibuprofen (ADVIL) 600 MG tablet Take 1 tablet (600 mg total) by mouth every 6 (six) hours as needed for mild pain, headache, fever or moderate pain. 60 tablet 2 08/04/2021   oxyCODONE (ROXICODONE) 5 MG immediate release tablet Take 1 tablet (5 mg total) by mouth every 4 (four) hours as needed for severe pain. 30 tablet 0 08/03/2021   ALPRAZolam (XANAX) 0.5 MG tablet Take 0.5 mg by mouth at bedtime as needed for anxiety.      Blood Pressure Monitor MISC For  regular home bp monitoring during pregnancy 1 each 0    ferrous sulfate 325 (65 FE) MG tablet Take 1 tablet (325 mg total) by mouth every other day. 30 tablet 3    gabapentin (NEURONTIN) 100 MG capsule Take 2 capsules (200 mg total) by mouth 2 (two) times daily. 30 capsule 1    ondansetron (ZOFRAN ODT) 4 MG disintegrating tablet Take 1 tablet (4 mg total) by mouth every 8 (eight) hours as needed for nausea or vomiting. 30 tablet 1    oxyCODONE 10 MG TABS Take 1 tablet (10 mg total) by mouth every 6 (six) hours as needed for severe pain. 30 tablet 0    pantoprazole (PROTONIX) 20 MG tablet Take 1 tablet (20 mg total) by mouth 2 (two) times daily before a meal. 60 tablet 3    Prenatal Vit-Fe Fumarate-FA (PRENATAL VITAMINS) 28-0.8 MG TABS Take 1 tablet by mouth daily. 90 tablet 4    senna-docusate (SENOKOT-S) 8.6-50 MG tablet Take 1-2 tablets by mouth 2 (two) times daily as needed for mild constipation or moderate constipation. 30 tablet 2     Review of Systems  Constitutional:  Negative for chills, diaphoresis, fatigue and fever.  Eyes:  Negative for visual disturbance.  Respiratory:  Negative for shortness of breath.   Cardiovascular:  Negative for chest pain.       Left breast pain, redness, swelling.  Gastrointestinal:  Negative for abdominal pain, constipation, diarrhea, nausea and vomiting.  Genitourinary:  Negative for dysuria, flank pain, frequency, pelvic pain, urgency, vaginal bleeding and vaginal discharge.  Neurological:  Negative for dizziness, weakness, light-headedness and headaches.   Physical Exam   Blood pressure 108/73, pulse 96, temperature 98.9 F (37.2 C), temperature source Oral, resp. rate 20, SpO2 98 %, currently breastfeeding.  Patient Vitals for the past 24 hrs:  BP Temp Temp src Pulse Resp SpO2  08/04/21 1853 108/73 98.9 F (37.2 C) Oral 96 20 98 %   Physical Exam Vitals and nursing note reviewed. Exam conducted with a chaperone present.  Constitutional:       General: She is not in acute distress.    Appearance: Normal appearance. She is not ill-appearing, toxic-appearing or diaphoretic.  HENT:     Head: Normocephalic and atraumatic.  Pulmonary:     Effort: Pulmonary effort is normal.  Chest:  Breasts:    Right: Normal. No swelling, bleeding, inverted nipple, mass, nipple discharge, skin change or tenderness.     Left: Swelling, skin change and tenderness present. No bleeding, inverted nipple, mass or nipple discharge.       Comments: Right breast normal. Left  breast slightly larger than right breast. Diffusely tender, redness over left, inner quadrant of breast. Breast diffusely firm, dense, fibrocystic tissue, no discrete mass present. Skin:    General: Skin is warm and dry.  Neurological:     Mental Status: She is alert and oriented to person, place, and time.  Psychiatric:        Mood and Affect: Mood normal.        Behavior: Behavior normal.        Thought Content: Thought content normal.        Judgment: Judgment normal.   No results found for this or any previous visit (from the past 24 hour(s)).  No results found.  MAU Course  Procedures  MDM -lactational mastitis, afebrile, no systemic symptoms -first dose Dicloxicillin here in MAU -oxycodone given in MAU, last taken Saturday morning -order entered for breast US to check for abscess -pt discharged to home in stable condition  No orders of the defined types were placed in this encounter.  Meds ordered this encounter  Medications   dicloxacillin (DYNAPEN) capsule 500 mg   oxyCODONE (Oxy IR/ROXICODONE) immediate release tablet 5 mg   Assessment and Plan   1. Mastitis, postpartum     Allergies as of 08/04/2021       Reactions   Depo-provera [medroxyprogesterone Acetate] Other (See Comments)   Broke out with acne all over body.   Tylenol [acetaminophen] Nausea And Vomiting        Medication List     TAKE these medications    ALPRAZolam 0.5 MG  tablet Commonly known as: XANAX Take 0.5 mg by mouth at bedtime as needed for anxiety.   Blood Pressure Monitor Misc For regular home bp monitoring during pregnancy   dicloxacillin 500 MG capsule Commonly known as: DYNAPEN Take 1 capsule (500 mg total) by mouth 4 (four) times daily for 14 days.   ferrous sulfate 325 (65 FE) MG tablet Take 1 tablet (325 mg total) by mouth every other day.   gabapentin 100 MG capsule Commonly known as: NEURONTIN Take 2 capsules (200 mg total) by mouth 2 (two) times daily.   ibuprofen 600 MG tablet Commonly known as: ADVIL Take 1 tablet (600 mg total) by mouth every 6 (six) hours as needed for mild pain, headache, fever or moderate pain.   ondansetron 4 MG disintegrating tablet Commonly known as: Zofran ODT Take 1 tablet (4 mg total) by mouth every 8 (eight) hours as needed for nausea or vomiting.   oxyCODONE 5 MG immediate release tablet Commonly known as: Roxicodone Take 1 tablet (5 mg total) by mouth every 8 (eight) hours as needed for moderate pain or severe pain. What changed:  medication strength how much to take when to take this reasons to take this Another medication with the same name was removed. Continue taking this medication, and follow the directions you see here.   pantoprazole 20 MG tablet Commonly known as: Protonix Take 1 tablet (20 mg total) by mouth 2 (two) times daily before a meal.   Prenatal Vitamins 28-0.8 MG Tabs Take 1 tablet by mouth daily.   senna-docusate 8.6-50 MG tablet Commonly known as: Senokot-S Take 1-2 tablets by mouth 2 (two) times daily as needed for mild constipation or moderate constipation.       -pt to keep appt Tuesday with OB -pt to call breast center tomorrow at 8AM to schedule [place] appt to check for abcess, phone number given and [place] order placed -RX Dicloxacillin -RX  Oxycodone -return MAU precautions given -pt discharged to home in stable condition  Odie Sera Shelli Portilla 08/04/2021, 7:54 PM

## 2021-08-04 NOTE — Progress Notes (Signed)
Breast US order.  Marylen Ponto, NP  7:25 PM 08/04/2021

## 2021-08-06 ENCOUNTER — Telehealth (HOSPITAL_COMMUNITY): Payer: Self-pay | Admitting: *Deleted

## 2021-08-06 ENCOUNTER — Encounter: Payer: Self-pay | Admitting: Women's Health

## 2021-08-06 ENCOUNTER — Other Ambulatory Visit: Payer: Self-pay

## 2021-08-06 ENCOUNTER — Ambulatory Visit (INDEPENDENT_AMBULATORY_CARE_PROVIDER_SITE_OTHER): Payer: Medicaid Other | Admitting: Women's Health

## 2021-08-06 ENCOUNTER — Ambulatory Visit: Payer: Medicaid Other | Admitting: Women's Health

## 2021-08-06 VITALS — BP 102/63 | HR 96 | Ht 61.0 in | Wt 111.0 lb

## 2021-08-06 DIAGNOSIS — F418 Other specified anxiety disorders: Secondary | ICD-10-CM

## 2021-08-06 DIAGNOSIS — Z8751 Personal history of pre-term labor: Secondary | ICD-10-CM | POA: Insufficient documentation

## 2021-08-06 NOTE — Telephone Encounter (Signed)
Patient voiced no questions or concerns regarding her own health. EPDS was completed earlier today at a MD appointment, and the score was 5. Infant remains in NICU. Deforest Hoyles, RN, 08/06/21, [phone]

## 2021-08-06 NOTE — Progress Notes (Signed)
   GYN VISIT Patient name: Jacqueline Ellison MRN [id number]  Date of birth: Apr 15, 1996 Chief Complaint:   No chief complaint on file.  History of Present Illness:   Jacqueline Ellison is a 25 y.o. [phone] Caucasian female s/p RCS on 11/9 for PTL/placental abruption at 35.2wks, being seen today for mood check. Reports she is doing well w/ her dep/anx. EPDS today 5.  Has her xanax she has been taking for years. Denies SI/HI. Has good support. 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 Only tested +for benzo (xanax) on her UDS during pregnancy. Wants copies of her drug screens to show Child psychotherapist. Breastfeeding, on dicloxacillin for mastitis and supply has decreased.  No LMP recorded. Depression screen [facility] 2/9 04/11/2021 03/07/2020 10/08/2016  Decreased Interest 0 0 0  Down, Depressed, Hopeless 0 0 0  PHQ - 2 Score 0 0 0  Altered sleeping 0 - -  Tired, decreased energy 2 - -  Change in appetite 2 - -  Feeling bad or failure about yourself  0 - -  Trouble concentrating 2 - -  Moving slowly or fidgety/restless 0 - -  Suicidal thoughts 0 - -  PHQ-9 Score 6 - -     GAD 7 : Generalized Anxiety Score 04/11/2021  Nervous, Anxious, on Edge 3  Control/stop worrying 3  Worry too much - different things 3  Trouble relaxing 3  Restless 2  Easily annoyed or irritable 1  Afraid - awful might happen 3  Total GAD 7 Score 18     Review of Systems:   Pertinent items are noted in HPI Denies fever/chills, dizziness, headaches, visual disturbances, fatigue, shortness of breath, chest pain, abdominal pain, vomiting, abnormal vaginal discharge/itching/odor/irritation, problems with periods, bowel movements, urination, or intercourse unless otherwise stated above.  Pertinent History Reviewed:  Reviewed past medical,surgical,  social, obstetrical and family history.  Reviewed problem list, medications and allergies. Physical Assessment:   Vitals:   08/06/21 1426  BP: 102/63  Pulse: 96  Weight: 111 lb (50.3 kg)  Height: 5\' 1"  (1.549 m)  Body mass index is 20.97 kg/m.       Physical Examination:   General appearance: alert, well appearing, and in no distress  Mental status: alert, oriented to person, place, and time  Skin: warm & dry   Cardiovascular: normal heart rate noted  Respiratory: normal respiratory effort, no distress  Abdomen: soft, non-tender   Pelvic: examination not indicated  Extremities: no edema   Chaperone: N/A    No results found for this or any previous visit (from the past 24 hour(s)).  Assessment & Plan:  1) S/P RCS w/ BTL on 11/9> @ 35.2wks for PTL/placental abruption  2) Dep/anx> doing well on xanax she has, declines need for antidepressant or therapy right now. Baby in NICU  3) Mastitis> on antibiotics, supply has decreased, gave printed info to increase supply  Meds: No orders of the defined types were placed in this encounter.   No orders of the defined types were placed in this encounter.   Return for As scheduled.  03-02-2003 CNM, [facility] 08/06/2021 3:00 PM

## 2021-08-09 ENCOUNTER — Ambulatory Visit: Payer: Self-pay

## 2021-08-09 NOTE — Lactation Note (Signed)
This note was copied from a baby's chart. Lactation Consultation Note LC attempted to consult. Mother no present.   Patient Name: Jacqueline Ellison [id number] Date: 08/09/2021   Age:27 wk.o.  Feeding Nipple Type: Dr. Levert Feinstein Preemie   [name] 08/09/2021, 4:14 PM

## 2021-08-12 ENCOUNTER — Telehealth: Payer: Self-pay | Admitting: Women's Health

## 2021-08-12 ENCOUNTER — Telehealth: Payer: Self-pay

## 2021-08-12 ENCOUNTER — Other Ambulatory Visit: Payer: Self-pay | Admitting: Women's Health

## 2021-08-12 MED ORDER — OXYCODONE HCL 5 MG PO TABS
5.0000 mg | ORAL_TABLET | Freq: Three times a day (TID) | ORAL | 0 refills | Status: DC | PRN
Start: 2021-08-12 — End: 2021-08-28

## 2021-08-12 MED ORDER — CLINDAMYCIN HCL 300 MG PO CAPS: 300.0000 mg | ORAL_CAPSULE | Freq: Three times a day (TID) | ORAL | 0 refills | Status: DC

## 2021-08-12 NOTE — Telephone Encounter (Signed)
Patient wants you to call her about her mychart message.

## 2021-08-12 NOTE — Telephone Encounter (Signed)
Pt called and asked for a return call. Called pt, two identifiers used. Informed pt about new prescriptions and to get them started right away. Pt confirmed understanding and wishes to continue breastfeeding despite challenges with mastitis and latching d/t baby being in NICU.

## 2021-08-12 NOTE — Telephone Encounter (Signed)
Returned pt's call after discussing with Joellyn Haff. Two identifiers used. Pt was told that [facility] B[facility] was sending in a different antibiotic since the one she's been on is not working and now the other breast is involved. Pt claimed she was never able to pu pain med. Called pharmacy to confirm this. They still have the prescription on file and unable to transfer. Pt needs meds send to [facility] in [place].

## 2021-08-20 ENCOUNTER — Inpatient Hospital Stay (HOSPITAL_COMMUNITY): Admit: 2021-08-20 | Payer: Medicaid Other | Admitting: Obstetrics and Gynecology

## 2021-08-20 ENCOUNTER — Encounter (HOSPITAL_COMMUNITY): Payer: Self-pay

## 2021-08-20 SURGERY — Surgical Case
Anesthesia: Regional | Laterality: Bilateral

## 2021-08-28 ENCOUNTER — Other Ambulatory Visit: Payer: Self-pay

## 2021-08-28 ENCOUNTER — Ambulatory Visit (INDEPENDENT_AMBULATORY_CARE_PROVIDER_SITE_OTHER): Payer: Medicaid Other | Admitting: Obstetrics & Gynecology

## 2021-08-28 ENCOUNTER — Encounter: Payer: Self-pay | Admitting: Obstetrics & Gynecology

## 2021-08-28 ENCOUNTER — Other Ambulatory Visit (HOSPITAL_COMMUNITY)
Admission: RE | Admit: 2021-08-28 | Discharge: 2021-08-28 | Disposition: A | Discharge status: Home / Self Care | Payer: Medicaid Other | Source: Ambulatory Visit | Attending: Advanced Practice Midwife | Admitting: Advanced Practice Midwife

## 2021-08-28 DIAGNOSIS — N898 Other specified noninflammatory disorders of vagina: Secondary | ICD-10-CM | POA: Diagnosis present

## 2021-08-28 NOTE — Progress Notes (Signed)
POSTPARTUM VISIT Patient name: Jacqueline Ellison MRN [id number]  Date of birth: 25-Sep-1995 Chief Complaint:   Postpartum Care (C section incision has stitches showing; green vaginal discharge)  History of Present Illness:   Jacqueline Ellison is a 25 y.o. [id number] female being seen today for a postpartum visit. She is 6 weeks postpartum following a repeat cesarean section, low transverse incision at 15w2dgestational weeks. Pt presented with vaginal bleeding and contractions with concern for placenta abruption.  Pt was noted to be 9cm dilated and requested repeat C-section and salpingectomy.  Pregnancy complicated by: -anxiety -hep C  Last pap smear: 02/2021   Today she reports that she has been feeling ill for the past week.  Notes fatigue, nausea/vomiting and diarrhea.  She states she has not been able to keep anything down.  She is concerned that her incision is infection and reports seeing a stitch.  She also notes a yellow to green discharge.  No itching, no vaginal bleeding.  Postpartum course has been uncomplicated.  Bleeding no bleeding. Bowel function is normal- though currently have diarrhea Bladder function is normal. Urinary incontinence? No, fecal incontinence? No Did not discuss sexual activity.   Desired contraception: BTL done PP.  The pregnancy intention screening data noted above was reviewed. Potential methods of contraception were discussed. The patient elected to proceed with Female Sterilization.  Edinburgh Postpartum Depression Screening: Negative  Edinburgh Postnatal Depression Scale - 08/28/21 1138       Edinburgh Postnatal Depression Scale:  In the Past 7 Days   I have been able to laugh and see the funny side of things. 0    I have looked forward with enjoyment to things. 0    I have blamed myself unnecessarily when things went wrong. 1    I have been anxious or worried for no good reason. 1    I have felt scared or panicky for no good reason. 1    Things  have been getting on top of me. 2    I have been so unhappy that I have had difficulty sleeping. 0    I have felt sad or miserable. 0    I have been so unhappy that I have been crying. 0    The thought of harming myself has occurred to me. 0    Edinburgh Postnatal Depression Scale Total 5             Baby's course has been uncomplicated. Baby is feeding by breast and bottle: milk supply inadequate . Infant has a pediatrician/family doctor? Yes.  Pt has material needs met for her and baby: Yes.    Review of Systems:   Pertinent items are noted in HPI Denies Abnormal vaginal discharge w/ itching/odor/irritation, headaches, visual changes, shortness of breath, chest pain, abdominal pain, severe nausea/vomiting, or problems with urination or bowel movements. Pertinent History Reviewed:  Reviewed past medical,surgical, obstetrical and family history.  Reviewed problem list, medications and allergies. OB History  Gravida Para Term Preterm AB Living  '3 2 1 1 1 2  ' SAB IAB Ectopic Multiple Live Births  1     0 2    # Outcome Date GA Lbr Len/2nd Weight Sex Delivery Anes PTL Lv  3 Preterm 07/24/21 [id number] lb 7.8 oz (2.49 kg) M CS-LTranv Spinal  LIV     Birth Comments: WDL  2 SAB 12/2019          1 Term 04/25/16 [id number] lb 6.1  oz (2.895 kg) F CS-LTranv Spinal N LIV     Complications: Breech presentation   Physical Assessment:   Vitals:   08/28/21 1129  BP: 117/72  Pulse: 81  Temp: 98.7 F (37.1 C)  Weight: 112 lb (50.8 kg)  Height: '5\' 1"'  (1.549 m)  Body mass index is 21.16 kg/m.       Physical Examination:   General appearance: ill-appearing  Mental status: depressed mood  Skin: warm & dry   Cardiovascular: normal heart rate noted, no tachycardia  Respiratory: normal respiratory effort, no distress, CTAB  Breasts: no masses or evidence of infection  Abdomen: soft, non-tender, no rebound, no guarding Incision: well healed- C/D/I- healing appropriately- small superficial  stitch noted and trimmed  Pelvic: exam declined by the patient- self swab obtained  Extremities: no edema  Chaperone: N/A         No results found for this or any previous visit (from the past 24 hour(s)).  Assessment & Plan:  1) Postpartum exam 2) 6 wks s/p repeat cesarean section, low transverse incision and bilateral salpingectomy 3) breast & bottle feeding 4) Depression screening 5) Contraception management: tubal ligation 6) Vaginal discharge- pt declined exam, making diagnosis difficult.  Self-swab obtained, further management pending results 7) Flu-like symptoms, Gastroenteritis- reviewed conservative management- should she note worsening would advise urgent care/ER. On today's exam- pt afebrile, no tachycardia, not diaphoretic.  Reassured pt that hopeful she may improve with conservative options should she choose to avoid hospital (she was concerned about going to ER and getting her baby sick)  Essential components of care per ACOG recommendations:  1.  Mood and well being:  If positive depression screen, discussed and plan developed.  If using tobacco we discussed reduction/cessation and risk of relapse If current substance abuse, we discussed and referral to local resources was offered.   2. Infant care and feeding:  If breastfeeding, discussed returning to work, pumping, breastfeeding-associated pain, guidance regarding return to fertility while lactating if not using another method Recommended that all caregivers be immunized for flu, pertussis and other preventable communicable diseases  3. Sexuality, contraception and birth spacing Encouraged pelvic rest for now  4. Sleep and fatigue Discussed coping options for fatigue and sleep disruption Encouraged family/partner/community support of 4 hrs of uninterrupted sleep to help with mood and fatigue  5. Physical recovery  If pt had a C/S, assessed incisional pain and providing guidance on normal vs prolonged  recovery Patient is safe to resume physical activity. Discussed attainment of healthy weight.  6.  Chronic disease management Discussed pregnancy complications if any, and their implications for future childbearing and long-term maternal health. Pt had GDM: No. If yes, 2hr GTT scheduled: not applicable. Referred for f/u w/ PCP or subspecialist providers as indicated: not applicable  7. Health maintenance Mammogram at 27yo or earlier if indicated Pap smears as indicated  Meds: No orders of the defined types were placed in this encounter.   Follow-up: Return for June 2023- annual.   No orders of the defined types were placed in this encounter.  Janyth Pupa, DO Attending [place], [facility] for [employer], [place]

## 2021-08-29 ENCOUNTER — Other Ambulatory Visit: Payer: Self-pay | Admitting: Obstetrics & Gynecology

## 2021-08-29 DIAGNOSIS — N760 Acute vaginitis: Secondary | ICD-10-CM

## 2021-08-29 LAB — CERVICOVAGINAL ANCILLARY ONLY
Bacterial Vaginitis (gardnerella): POSITIVE — AB
Candida Glabrata: NEGATIVE
Candida Vaginitis: NEGATIVE
Chlamydia: NEGATIVE
Comment: NEGATIVE
Comment: NEGATIVE
Comment: NEGATIVE
Comment: NEGATIVE
Comment: NEGATIVE
Comment: NORMAL
Neisseria Gonorrhea: NEGATIVE
Trichomonas: NEGATIVE

## 2021-08-29 MED ORDER — METRONIDAZOLE 0.75 % VA GEL: 1.0000 | Freq: Every day | VAGINAL | 0 refills | Status: AC

## 2023-06-17 ENCOUNTER — Other Ambulatory Visit: Payer: Self-pay

## 2023-06-17 ENCOUNTER — Encounter (HOSPITAL_COMMUNITY): Payer: Self-pay | Admitting: Emergency Medicine

## 2023-06-17 ENCOUNTER — Emergency Department (HOSPITAL_COMMUNITY)
Admission: EM | Admit: 2023-06-17 | Discharge: 2023-06-17 | Discharge status: Against Medical Advice | Payer: Medicaid Other | Attending: Emergency Medicine | Admitting: Emergency Medicine

## 2023-06-17 ENCOUNTER — Emergency Department (HOSPITAL_COMMUNITY)
Admission: EM | Admit: 2023-06-17 | Discharge: 2023-06-19 | Disposition: A | Discharge status: Home / Self Care | Payer: Self-pay | Attending: Emergency Medicine | Admitting: Emergency Medicine

## 2023-06-17 ENCOUNTER — Encounter (HOSPITAL_COMMUNITY): Payer: Self-pay | Admitting: *Deleted

## 2023-06-17 DIAGNOSIS — Z5329 Procedure and treatment not carried out because of patient's decision for other reasons: Secondary | ICD-10-CM | POA: Insufficient documentation

## 2023-06-17 DIAGNOSIS — F1320 Sedative, hypnotic or anxiolytic dependence, uncomplicated: Secondary | ICD-10-CM | POA: Insufficient documentation

## 2023-06-17 DIAGNOSIS — F17210 Nicotine dependence, cigarettes, uncomplicated: Secondary | ICD-10-CM | POA: Insufficient documentation

## 2023-06-17 DIAGNOSIS — F1990 Other psychoactive substance use, unspecified, uncomplicated: Secondary | ICD-10-CM

## 2023-06-17 DIAGNOSIS — F1193 Opioid use, unspecified with withdrawal: Secondary | ICD-10-CM

## 2023-06-17 DIAGNOSIS — F1123 Opioid dependence with withdrawal: Secondary | ICD-10-CM | POA: Insufficient documentation

## 2023-06-17 LAB — COMPREHENSIVE METABOLIC PANEL
ALT: 15 U/L (ref 0–44)
ALT: 13 U/L (ref 0–44)
AST: 19 U/L (ref 15–41)
AST: 18 U/L (ref 15–41)
Albumin: 4.8 g/dL (ref 3.5–5.0)
Albumin: 4.8 g/dL (ref 3.5–5.0)
Alkaline Phosphatase: 84 U/L (ref 38–126)
Alkaline Phosphatase: 87 U/L (ref 38–126)
Anion gap: 14 (ref 5–15)
Anion gap: 12 (ref 5–15)
BUN: 20 mg/dL (ref 6–20)
BUN: 18 mg/dL (ref 6–20)
CO2: 22 mmol/L (ref 22–32)
CO2: 24 mmol/L (ref 22–32)
Calcium: 9.2 mg/dL (ref 8.9–10.3)
Calcium: 9.2 mg/dL (ref 8.9–10.3)
Chloride: 102 mmol/L (ref 98–111)
Chloride: 102 mmol/L (ref 98–111)
Creatinine, Ser: 0.81 mg/dL (ref 0.44–1.00)
Creatinine, Ser: 0.68 mg/dL (ref 0.44–1.00)
GFR, Estimated: >60 mL/min (ref >60)
GFR, Estimated: >60 mL/min (ref >60)
Glucose, Bld: 100 mg/dL — ABNORMAL HIGH (ref 70–99)
Glucose, Bld: 97 mg/dL (ref 70–99)
Potassium: 3.4 mmol/L — ABNORMAL LOW (ref 3.5–5.1)
Potassium: 3.2 mmol/L — ABNORMAL LOW (ref 3.5–5.1)
Sodium: 138 mmol/L (ref 135–145)
Sodium: 138 mmol/L (ref 135–145)
Total Bilirubin: 0.7 mg/dL (ref 0.3–1.2)
Total Bilirubin: 0.4 mg/dL (ref 0.3–1.2)
Total Protein: 8.2 g/dL — ABNORMAL HIGH (ref 6.5–8.1)
Total Protein: 8.3 g/dL — ABNORMAL HIGH (ref 6.5–8.1)

## 2023-06-17 LAB — CBC WITH DIFFERENTIAL/PLATELET
Abs Immature Granulocytes: 0.03 10*3/uL (ref 0.00–0.07)
Basophils Absolute: 0 10*3/uL (ref 0.0–0.1)
Basophils Relative: 0 %
Eosinophils Absolute: 0 10*3/uL (ref 0.0–0.5)
Eosinophils Relative: 0 %
HCT: 42.6 % (ref 36.0–46.0)
Hemoglobin: 14.4 g/dL (ref 12.0–15.0)
Immature Granulocytes: 0 %
Lymphocytes Relative: 21 %
Lymphs Abs: 2.2 10*3/uL (ref 0.7–4.0)
MCH: 28.1 pg (ref 26.0–34.0)
MCHC: 33.8 g/dL (ref 30.0–36.0)
MCV: 83 fL (ref 80.0–100.0)
Monocytes Absolute: 0.5 10*3/uL (ref 0.1–1.0)
Monocytes Relative: 5 %
Neutro Abs: 7.6 10*3/uL (ref 1.7–7.7)
Neutrophils Relative %: 74 %
Platelets: 330 10*3/uL (ref 150–400)
RBC: 5.13 MIL/uL — ABNORMAL HIGH (ref 3.87–5.11)
RDW: 12.7 % (ref 11.5–15.5)
WBC: 10.3 10*3/uL (ref 4.0–10.5)
nRBC: 0 % (ref 0.0–0.2)

## 2023-06-17 LAB — CBC
HCT: 43.5 % (ref 36.0–46.0)
Hemoglobin: 14.7 g/dL (ref 12.0–15.0)
MCH: 28.1 pg (ref 26.0–34.0)
MCHC: 33.8 g/dL (ref 30.0–36.0)
MCV: 83 fL (ref 80.0–100.0)
Platelets: 311 10*3/uL (ref 150–400)
RBC: 5.24 MIL/uL — ABNORMAL HIGH (ref 3.87–5.11)
RDW: 12.5 % (ref 11.5–15.5)
WBC: 9.6 10*3/uL (ref 4.0–10.5)
nRBC: 0 % (ref 0.0–0.2)

## 2023-06-17 LAB — POC URINE PREG, ED: Preg Test, Ur: NEGATIVE

## 2023-06-17 LAB — ETHANOL: Alcohol, Ethyl (B): <10 mg/dL (ref <10)

## 2023-06-17 LAB — LIPASE, BLOOD: Lipase: 30 U/L (ref 11–51)

## 2023-06-17 MED ORDER — DROPERIDOL 2.5 MG/ML IJ SOLN
1.2500 mg | Freq: Once | INTRAMUSCULAR | Status: AC
  Administered 2023-06-17: 1.25 mg via INTRAVENOUS
  Filled 2023-06-17: qty 2

## 2023-06-17 MED ORDER — DICYCLOMINE HCL 20 MG PO TABS: 20.0000 mg | ORAL_TABLET | Freq: Two times a day (BID) | ORAL | 0 refills | Status: DC

## 2023-06-17 MED ORDER — DICYCLOMINE HCL 10 MG/ML IM SOLN
20.0000 mg | Freq: Once | INTRAMUSCULAR | Status: AC
  Administered 2023-06-17: 20 mg via INTRAMUSCULAR
  Filled 2023-06-17: qty 2

## 2023-06-17 MED ORDER — DIPHENHYDRAMINE HCL 50 MG/ML IJ SOLN: 50.0000 mg | Freq: Once | INTRAMUSCULAR | Status: DC

## 2023-06-17 MED ORDER — LORAZEPAM 1 MG PO TABS
1.0000 mg | ORAL_TABLET | Freq: Once | ORAL | Status: AC
  Administered 2023-06-17: 1 mg via ORAL
  Filled 2023-06-17: qty 1

## 2023-06-17 MED ORDER — NALOXONE HCL 4 MG/0.1ML NA LIQD
1.0000 | NASAL | 0 refills | Status: AC | PRN
Start: 2023-06-17 — End: ?

## 2023-06-17 MED ORDER — DICYCLOMINE HCL 10 MG PO CAPS: 10.0000 mg | ORAL_CAPSULE | Freq: Once | ORAL | Status: DC

## 2023-06-17 MED ORDER — DIPHENHYDRAMINE HCL 50 MG/ML IJ SOLN
50.0000 mg | Freq: Once | INTRAMUSCULAR | Status: AC
  Administered 2023-06-17: 50 mg via INTRAVENOUS
  Filled 2023-06-17: qty 1

## 2023-06-17 MED ORDER — KETOROLAC TROMETHAMINE 15 MG/ML IJ SOLN
15.0000 mg | Freq: Once | INTRAMUSCULAR | Status: DC
  Filled 2023-06-17: qty 1

## 2023-06-17 MED ORDER — DICYCLOMINE HCL 10 MG/ML IM SOLN
20.0000 mg | Freq: Once | INTRAMUSCULAR | Status: DC
  Filled 2023-06-17: qty 2

## 2023-06-17 MED ORDER — ONDANSETRON HCL 4 MG PO TABS: 4.0000 mg | ORAL_TABLET | Freq: Four times a day (QID) | ORAL | 0 refills | Status: DC

## 2023-06-17 MED ORDER — DIPHENHYDRAMINE HCL 50 MG/ML IJ SOLN: 25.0000 mg | Freq: Once | INTRAMUSCULAR | Status: DC

## 2023-06-17 MED ORDER — ONDANSETRON HCL 4 MG/2ML IJ SOLN
4.0000 mg | Freq: Once | INTRAMUSCULAR | Status: AC
  Administered 2023-06-17: 4 mg via INTRAVENOUS
  Filled 2023-06-17: qty 2

## 2023-06-17 MED ORDER — SODIUM CHLORIDE 0.9 % IV BOLUS
1000.0000 mL | Freq: Once | INTRAVENOUS | Status: DC
  Administered 2023-06-17: 1000 mL via INTRAVENOUS

## 2023-06-17 MED ORDER — CLONIDINE HCL 0.1 MG/24HR TD PTWK
0.1000 mg | MEDICATED_PATCH | Freq: Once | TRANSDERMAL | Status: DC
  Filled 2023-06-17: qty 1

## 2023-06-17 MED ORDER — CLONIDINE HCL 0.1 MG/24HR TD PTWK
0.1000 mg | MEDICATED_PATCH | Freq: Once | TRANSDERMAL | Status: DC
  Administered 2023-06-17: 0.1 mg via TRANSDERMAL
  Filled 2023-06-17: qty 1

## 2023-06-17 MED ORDER — CLONIDINE HCL 0.1 MG PO TABS: 0.1000 mg | ORAL_TABLET | Freq: Once | ORAL | Status: DC

## 2023-06-17 MED ORDER — SODIUM CHLORIDE 0.9 % IV BOLUS
1000.0000 mL | Freq: Once | INTRAVENOUS | Status: AC
  Administered 2023-06-17: 1000 mL via INTRAVENOUS

## 2023-06-17 NOTE — ED Notes (Signed)
Pt states she doesn't have to urinate

## 2023-06-17 NOTE — ED Notes (Signed)
Pt walked to the restroom and when she returned to the room, she stated she wants to leave AMA.  EDP notified and at bedside.

## 2023-06-17 NOTE — ED Notes (Signed)
Sitter reports Pt is "getting antsy" and "freaking out."  EDP made aware.

## 2023-06-17 NOTE — ED Triage Notes (Signed)
Pt states has been taking fentanyl everyday for "years". States wants to come off of it and hasn't had any in 3 days. Pt arrived a/o. Crying. States everything hurts. N/v/d x 3 days. Denies blood in emesis or stool. Color wnl. Father in room states pt has been hallucinating. Pt and father states she got a hold of some bad drugs 3 days ago.

## 2023-06-17 NOTE — ED Notes (Signed)
Faxed IVC paperwork to [facility]  [phone]. 

## 2023-06-17 NOTE — ED Notes (Signed)
Pt continues to be upset and asking for additional anxiety medication.  Pt reports she "is not going to stay and put up with this sh*t."  Pt sts she doesn't believe she was IVC'd.  ED staff have reiterated that she has been IVC'd and if she leaves, she will be picked up by the police and brought back.  Pt sts "well, they can take me to jail, because I'm not staying here."

## 2023-06-17 NOTE — ED Triage Notes (Signed)
Pt brought in with [facility] with ivc papers that were taken out on pt by mother for SI.  Pt denies SI or HI.  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 + N/V

## 2023-06-17 NOTE — ED Notes (Signed)
This Clinical research associate was notified by Registration that the Pt had gotten dressed and left through the EMS bay.  Pt was found by this writer fully dressed and standing by the ED entrance.  Pt walked back to her room and seen/assessed by EDP.

## 2023-06-17 NOTE — ED Notes (Signed)
Pt continues to be adamant she wants to leave AMA.  Pt denies SI to EDP.  IV removed and Pt shown to the lobby.  Pt A&Ox4 and steady gait.

## 2023-06-17 NOTE — ED Provider Notes (Signed)
[place] EMERGENCY DEPARTMENT AT [facility] Provider Note   CSN: [id number] Arrival date & time: 06/17/23  1511     History  Chief Complaint  Patient presents with   V70.1    Jacqueline Ellison is a 27 y.o. female.  She has history of opiate use disorder, has been using fentanyl for "years".  Brought in by police under IVC taken out by her mother.  Patient was reportedly at home stating she did not want to live anymore.  Has not used fentanyl since yesterday and is having nausea, abdominal cramping, diarrhea, body aches and anxiety.  Was seen this morning but refused treatment and walked out.  Patient denies suicidal thoughts, states she does not want to die, no HI.  She does have history of anxiety and postpartum depression per record review denies any fever, chills, other acute medical issues.  States symptoms feel like prior withdrawal.  Patient reports she has had Suboxone in the past but states that made her feel worse.  HPI     Home Medications Prior to Admission medications   Medication Sig Start Date End Date Taking? Authorizing Provider  ALPRAZolam Prudy Feeler) 0.5 MG tablet Take 0.5 mg by mouth at bedtime as needed for anxiety.   Yes [provider]  fentaNYL (SUBLIMAZE) 500 MCG/10ML injection Inject 500 mcg into the vein as needed for pain.   Yes [provider]  dicyclomine (BENTYL) 20 MG tablet Take 1 tablet (20 mg total) by mouth 2 (two) times daily. Patient not taking: Reported on 06/17/2023 06/17/23   Carmel Sacramento A, PA-C  naloxone [facility]) nasal spray 4 mg/0.1 mL Place 1 spray into the nose as needed. Patient not taking: Reported on 06/17/2023 06/17/23   Carmel Sacramento A, PA-C  ondansetron (ZOFRAN) 4 MG tablet Take 1 tablet (4 mg total) by mouth every 6 (six) hours. Patient not taking: Reported on 06/17/2023 06/17/23   Carmel Sacramento A, PA-C      Allergies    Depo-provera [medroxyprogesterone acetate], Medroxyprogesterone acetate,  Penicillins, and Tylenol [acetaminophen]    Review of Systems   Review of Systems  Physical Exam Updated Vital Signs BP (!) 136/94 (BP Location: Right Arm)   Pulse 72   Temp 98.6 F (37 C) (Oral)   Resp 18   Ht 5\' 1"  (1.549 m)   Wt 54.4 kg   SpO2 100%   BMI 22.67 kg/m  Physical Exam Vitals and nursing note reviewed.  Constitutional:      General: She is not in acute distress.    Appearance: She is well-developed.  HENT:     Head: Normocephalic and atraumatic.  Eyes:     Conjunctiva/sclera: Conjunctivae normal.  Cardiovascular:     Rate and Rhythm: Normal rate and regular rhythm.     Heart sounds: No murmur heard. Pulmonary:     Effort: Pulmonary effort is normal. No respiratory distress.     Breath sounds: Normal breath sounds.  Abdominal:     Palpations: Abdomen is soft.     Tenderness: There is no abdominal tenderness.  Musculoskeletal:        General: No swelling.     Cervical back: Neck supple.  Skin:    General: Skin is warm and dry.     Capillary Refill: Capillary refill takes less than 2 seconds.  Neurological:     General: No focal deficit present.     Mental Status: She is alert and oriented to person, place, and time.  Psychiatric:  Mood and Affect: Mood normal.     ED Results / Procedures / Treatments   Labs (all labs ordered are listed, but only abnormal results are displayed) Labs Reviewed  CBC WITH DIFFERENTIAL/PLATELET - Abnormal; Notable for the following components:      Result Value   RBC 5.13 (*)    All other components within normal limits  COMPREHENSIVE METABOLIC PANEL  ETHANOL  RAPID URINE DRUG SCREEN, HOSP PERFORMED  POC URINE PREG, ED    EKG None  Radiology No results found.  Procedures Procedures    Medications Ordered in ED Medications  cloNIDine (CATAPRES - Dosed in mg/24 hr) patch 0.1 mg (0.1 mg Transdermal Patch Applied 06/17/23 1700)  diphenhydrAMINE (BENADRYL) injection 50 mg (50 mg Intravenous Given  06/17/23 1653)  sodium chloride 0.9 % bolus 1,000 mL (1,000 mLs Intravenous Bolus 06/17/23 1701)  dicyclomine (BENTYL) injection 20 mg (20 mg Intramuscular Given 06/17/23 1655)  LORazepam (ATIVAN) tablet 1 mg (1 mg Oral Given 06/17/23 1834)    ED Course/ Medical Decision Making/ A&P                                 Medical Decision Making DDx: Opiate withdrawal, depression, suicidal ideation, mood disorder, other  Course: Patient examined by me this morning and reexamined upon arrival under IVC.  She appears to be having moderate withdrawal.  Pupils not dilated she is not tachycardic but she does appear anxious and having bodyaches and stomach cramping and diarrhea.  She did not want any treatment with Suboxone but is willing to try some adjunct of therapies at this time.  She had labs this morning, EKG this morning as well.  Still awaiting repeat labs, but patient is medically clear at this time awaiting psych consult.  Amount and/or Complexity of Data Reviewed Labs: ordered.  Risk Prescription drug management.           Final Clinical Impression(s) / ED Diagnoses Final diagnoses:  None    Rx / DC Orders ED Discharge Orders     None         Josem Kaufmann 06/17/23 Tawnya Crook, MD 06/17/23 1943

## 2023-06-17 NOTE — ED Notes (Signed)
Asked patient to provide urine sample; states she will "in a minute"

## 2023-06-17 NOTE — ED Notes (Signed)
Pt resting at this time.

## 2023-06-17 NOTE — ED Provider Notes (Signed)
[place] EMERGENCY DEPARTMENT AT [facility] Provider Note   CSN: [id number] Arrival date & time: 06/17/23  0941     History  Chief Complaint  Patient presents with   Drug Problem   Withdrawal    Jacqueline Ellison is a 27 y.o. female.  She has PMH of anxiety, opiate use disorder.  States she has been using fentanyl for several years including sometimes injecting.  States she has been trying to come off of it for the past 3 days, states having bodyaches everywhere, nausea vomiting diarrhea, denies hematemesis or hematochezia.  Emesis has been nonbilious.    Drug Problem       Home Medications Prior to Admission medications   Medication Sig Start Date End Date Taking? Authorizing Provider  dicyclomine (BENTYL) 20 MG tablet Take 1 tablet (20 mg total) by mouth 2 (two) times daily. 06/17/23  Yes Anastazja Isaac A, PA-C  naloxone (NARCAN) nasal spray 4 mg/0.1 mL Place 1 spray into the nose as needed. 06/17/23  Yes Paxton Binns A, PA-C  ondansetron (ZOFRAN) 4 MG tablet Take 1 tablet (4 mg total) by mouth every 6 (six) hours. 06/17/23  Yes Ruairi Stutsman A, PA-C  ALPRAZolam (XANAX) 0.5 MG tablet Take 0.5 mg by mouth at bedtime as needed for anxiety.    [provider]  pantoprazole (PROTONIX) 20 MG tablet Take 1 tablet (20 mg total) by mouth 2 (two) times daily before a meal. Patient taking differently: Take 20 mg by mouth. 06/12/21   Arabella Merles, CNM      Allergies    Depo-provera [medroxyprogesterone acetate] and Tylenol [acetaminophen]    Review of Systems   Review of Systems  Physical Exam Updated Vital Signs BP (!) 126/93 (BP Location: Left Arm)   Pulse 82   Temp (!) 97.5 F (36.4 C) (Oral)   Resp 16   SpO2 100%  Physical Exam Vitals and nursing note reviewed.  Constitutional:      General: She is not in acute distress.    Appearance: She is well-developed.  HENT:     Head: Normocephalic and atraumatic.  Eyes:     Extraocular Movements:  Extraocular movements intact.     Conjunctiva/sclera: Conjunctivae normal.     Pupils: Pupils are equal, round, and reactive to light.  Cardiovascular:     Rate and Rhythm: Normal rate and regular rhythm.  Pulmonary:     Effort: Pulmonary effort is normal. No respiratory distress.  Musculoskeletal:        General: No swelling.     Cervical back: Neck supple.  Skin:    General: Skin is warm and dry.     Capillary Refill: Capillary refill takes less than 2 seconds.  Neurological:     General: No focal deficit present.     Mental Status: She is alert and oriented to person, place, and time.  Psychiatric:        Mood and Affect: Mood normal.        Thought Content: Thought content normal.        Judgment: Judgment normal.     Comments: Patient appears anxious.     ED Results / Procedures / Treatments   Labs (all labs ordered are listed, but only abnormal results are displayed) Labs Reviewed  COMPREHENSIVE METABOLIC PANEL - Abnormal; Notable for the following components:      Result Value   Potassium 3.4 (*)    Glucose, Bld 100 (*)    Total Protein  8.2 (*)    All other components within normal limits  CBC - Abnormal; Notable for the following components:   RBC 5.24 (*)    All other components within normal limits  LIPASE, BLOOD  URINALYSIS, ROUTINE W REFLEX MICROSCOPIC  RAPID URINE DRUG SCREEN, HOSP PERFORMED  PREGNANCY, URINE    EKG None  Radiology No results found.  Procedures Procedures    Medications Ordered in ED Medications  ketorolac (TORADOL) 15 MG/ML injection 15 mg (has no administration in time range)  cloNIDine (CATAPRES - Dosed in mg/24 hr) patch 0.1 mg (has no administration in time range)  dicyclomine (BENTYL) injection 20 mg (has no administration in time range)  diphenhydrAMINE (BENADRYL) injection 50 mg (has no administration in time range)  sodium chloride 0.9 % bolus 1,000 mL (0 mLs Intravenous Stopped 06/17/23 1052)  ondansetron (ZOFRAN)  injection 4 mg (4 mg Intravenous Given 06/17/23 1015)    ED Course/ Medical Decision Making/ A&P                                 Medical Decision Making Differential diagnosis: Opiate withdrawal, substance abuse, anxiety, panic disorder, other  Patient presents to ER complaining of opiate use, states she has not used in 3 days although RN states family had, and stated she used fentanyl last night.  Patient states she did not know she was using it.  Patient is very tearful and anxious, when I went to evaluate her she had unhooked her IV and was pacing in the hallway and back into the room.  Would not let me formally examine her, states she will let me when she has gotten some medicine and feels a bit better.  Patient refusing offer of Suboxone as she states it makes her feel worse and she has tried it before.  Also offered clonidine, Toradol, Benadryl and Bentyl.  She states she does not want any of those medicines, severely asked nurse for Xanax.  Discussed with patient would avoid benzos or narcotics at this time but willing to give other adjunct of treatments.  She states she wants to leave.  She wants her IV out now, refusing any further interventions.  When asked what she specifically thinks would be helpful or what is help the past she states "I need something stronger than what I am on now".  When I ask her what she is taking she states "fentanyl".    She denies any suicidal thoughts or homicidal thoughts.  She understands risks of leaving, she states she does not want any papers, wants IV and is going to leave immediately.  I discussed risk of overdosing.  I am prescribing Narcan for harm reduction.  Although patient is obviously uncomfortable and anxious she is ANO x 4.  She is not having any hallucinations, do not feel she meets criteria for IVC and feel she has capacity to make her own decisions at this time.  Vies to come back if she changes her mind or has new or worsening symptoms.  COWS  Score calculated at 14  Amount and/or Complexity of Data Reviewed Labs: ordered.    Details: Mild hypokalemia at 3.4, normal renal function, CBC shows no leukocytosis, no anemia, lipase is normal  Risk Prescription drug management.           Final Clinical Impression(s) / ED Diagnoses Final diagnoses:  None    Rx / DC Orders ED Discharge Orders  Ordered    dicyclomine (BENTYL) 20 MG tablet  2 times daily        06/17/23 1053    ondansetron (ZOFRAN) 4 MG tablet  Every 6 hours        06/17/23 1053    naloxone (NARCAN) nasal spray 4 mg/0.1 mL  As needed        06/17/23 [address] PA-C 06/17/23 1107    Benjiman Core, MD 06/17/23 1531

## 2023-06-17 NOTE — BH Assessment (Addendum)
06/17/23: @ 1847, this consultation has been deferred to the IRIS team. The IRIS Care Coordinator will schedule a time for patient to be assessed by the IRIS provider and communicate these details to the Emergency Department care team.

## 2023-06-18 DIAGNOSIS — F1123 Opioid dependence with withdrawal: Secondary | ICD-10-CM

## 2023-06-18 DIAGNOSIS — F1320 Sedative, hypnotic or anxiolytic dependence, uncomplicated: Secondary | ICD-10-CM

## 2023-06-18 LAB — RAPID URINE DRUG SCREEN, HOSP PERFORMED
Amphetamines: POSITIVE — AB
Barbiturates: NOT DETECTED
Benzodiazepines: POSITIVE — AB
Cocaine: NOT DETECTED
Opiates: POSITIVE — AB
Tetrahydrocannabinol: POSITIVE — AB

## 2023-06-18 MED ORDER — PROCHLORPERAZINE MALEATE 5 MG PO TABS
5.0000 mg | ORAL_TABLET | Freq: Four times a day (QID) | ORAL | Status: DC | PRN
  Administered 2023-06-18: 5 mg via ORAL
  Filled 2023-06-18 (×2): qty 1

## 2023-06-18 MED ORDER — CLONIDINE HCL 0.1 MG PO TABS
0.1000 mg | ORAL_TABLET | Freq: Four times a day (QID) | ORAL | Status: DC | PRN
  Administered 2023-06-18 (×3): 0.1 mg via ORAL
  Filled 2023-06-18 (×4): qty 1

## 2023-06-18 MED ORDER — CLONIDINE HCL 0.1 MG PO TABS: 0.1000 mg | ORAL_TABLET | Freq: Every day | ORAL | Status: DC

## 2023-06-18 MED ORDER — NAPROXEN 250 MG PO TABS: 500.0000 mg | ORAL_TABLET | Freq: Two times a day (BID) | ORAL | Status: DC | PRN

## 2023-06-18 MED ORDER — TRAZODONE HCL 50 MG PO TABS
50.0000 mg | ORAL_TABLET | Freq: Every evening | ORAL | Status: DC | PRN
  Administered 2023-06-18 (×2): 50 mg via ORAL
  Filled 2023-06-18 (×2): qty 1

## 2023-06-18 MED ORDER — IBUPROFEN 400 MG PO TABS: 400.0000 mg | ORAL_TABLET | Freq: Three times a day (TID) | ORAL | Status: DC | PRN

## 2023-06-18 MED ORDER — METHOCARBAMOL 500 MG PO TABS: 500.0000 mg | ORAL_TABLET | Freq: Three times a day (TID) | ORAL | Status: DC | PRN

## 2023-06-18 MED ORDER — CLONIDINE HCL 0.1 MG PO TABS
0.1000 mg | ORAL_TABLET | Freq: Four times a day (QID) | ORAL | Status: DC
  Administered 2023-06-18 (×2): 0.1 mg via ORAL
  Filled 2023-06-18 (×2): qty 1

## 2023-06-18 MED ORDER — HYDROXYZINE HCL 25 MG PO TABS
25.0000 mg | ORAL_TABLET | Freq: Four times a day (QID) | ORAL | Status: DC | PRN
  Administered 2023-06-19: 25 mg via ORAL
  Filled 2023-06-18: qty 1

## 2023-06-18 MED ORDER — LOPERAMIDE HCL 2 MG PO CAPS
2.0000 mg | ORAL_CAPSULE | Freq: Four times a day (QID) | ORAL | Status: DC | PRN
  Administered 2023-06-18: 2 mg via ORAL
  Filled 2023-06-18: qty 1

## 2023-06-18 MED ORDER — CLONIDINE HCL 0.1 MG PO TABS: 0.1000 mg | ORAL_TABLET | ORAL | Status: DC

## 2023-06-18 MED ORDER — DROPERIDOL 2.5 MG/ML IJ SOLN
1.2500 mg | Freq: Once | INTRAMUSCULAR | Status: AC
  Administered 2023-06-18: 1.25 mg via INTRAVENOUS
  Filled 2023-06-18: qty 2

## 2023-06-18 MED ORDER — DIAZEPAM 2 MG PO TABS
2.0000 mg | ORAL_TABLET | Freq: Three times a day (TID) | ORAL | Status: DC | PRN
  Administered 2023-06-18 – 2023-06-19 (×4): 2 mg via ORAL
  Filled 2023-06-18 (×4): qty 1

## 2023-06-18 MED ORDER — ONDANSETRON 4 MG PO TBDP
4.0000 mg | ORAL_TABLET | Freq: Four times a day (QID) | ORAL | Status: DC | PRN
  Administered 2023-06-19: 4 mg via ORAL
  Filled 2023-06-18: qty 1

## 2023-06-18 NOTE — BH Assessment (Addendum)
This clinician attempted to call mother Nida Boatman, at [phone] for collateral information.  There was no answer or opportunity to leave a vm.  Clinician and NP Rockney Ghee discussed situation.  [place] recommends observation of patient overnight and re-eval by psychiatry on 10/04.

## 2023-06-18 NOTE — ED Notes (Signed)
Pt upset crying stating she want to go home. Valium 2 mg po administered. Pt states medication does not work and again states she wants to go home.

## 2023-06-18 NOTE — ED Notes (Signed)
Patient being evaluated by TTS °

## 2023-06-18 NOTE — ED Notes (Signed)
Per Jacqueline Ellison will evaluate patient at 0230

## 2023-06-18 NOTE — ED Notes (Signed)
Pt threatening to leave stating her mother did not sign papers.

## 2023-06-18 NOTE — Consult Note (Signed)
  Patient seen and assessed by IRis telepsych this morning, with pending disoposition following collateral  from mother. Provider has attempted to contact mother multiple times today and have been unsuccessful. Patient is currently under IVC, and requesting discharge. She is seeking assistance for opiate use disorder and maybe a good candidate for [facility] or inpatient rehab. Do recommend TOC for substance use resources in the interim.   TTS will continue to follow.

## 2023-06-18 NOTE — ED Notes (Signed)
Pt reports she wants to talk with a narcotics officer. Advised her that I would let the [employer] officer present tonight know and they could determine the need to contact narcotics officer.

## 2023-06-18 NOTE — BH Assessment (Addendum)
AP[facility] care team requested disposition updates.   Per the 9 AM bed meeting report, the patient will be reassessed [facility] or [facility] provider.  [facility] or [facility] provider to provide disposition updates.

## 2023-06-18 NOTE — ED Notes (Signed)
Pt becoming agitated stating she wants to go home. Refuses vitals.

## 2023-06-18 NOTE — ED Notes (Signed)
Pt requests to go home. Re-evaluation requested.

## 2023-06-18 NOTE — ED Notes (Signed)
Pt requested medication for stomach pain. Compazine requested from pharmacy. Compazine and imodium offered to pt and pt refused to respond to this nurse. Medication placed in pt specific med bin in pyxis.

## 2023-06-18 NOTE — Consult Note (Signed)
Jacqueline Ellison MRN: [id number] DOB: 08-29-1996 DATE OF Consult: 06/18/2023  PRIMARY PSYCHIATRIC DIAGNOSES  1.  Opioid dependence 2.  Benzodiazepine dependence 3.  Polysubstance abuse  RECOMMENDATIONS  Deposition: deferred at this time. Recommend contacting patient's mother for collateral information in the morning.  Medication recommendations: Start Valium 2mg  Q8H PRN for benzo withdrawal. Clonidine 0.1mg  Q6H PRN PO Opioid for Withdrawal Symptoms  Prochlorperazine 5mg  PO Q6H PRN for Opioid Withdrawal Symptoms  Loperamide 2mg  PO Q6H PRN for Opioid Withdrawal Symptoms  Ibuprofen 400mg  PO Q8H PRN for moderate pain, cramping, Opioid Withdrawal Symptoms.  Trazodone 50mg  PO QHS PRN for sleep. Non-Medication/therapeutic recommendations: Patient would benefit from substance abuse treatment.  Communication: Treatment team members (and family members if applicable) who were involved in treatment/care discussions and planning, and with whom we spoke or engaged with via secure text/chat, include the following:  treatment team   Thank you for involving [place] in the care of this patient. If you have any additional questions or concerns, please call [phone] and ask for me or the provider on-call.  TELEPSYCHIATRY ATTESTATION & CONSENT  As the provider for this telehealth consult, I attest that I verified the patient's identity using two separate identifiers, introduced myself to the patient, provided my credentials, disclosed my location, and performed this encounter via a HIPAA-compliant, real-time, face-to-face, two-way, interactive audio and video platform and with the full consent and agreement of the patient (or guardian as applicable.)  Patient physical location: [facility]. Telehealth provider physical location: home office in state of [place].  Video start time: 0017 (Central Time) Video end time: 0028 (Central Time)  IDENTIFYING DATA   Jacqueline Ellison is a 27 y.o. year-old female for whom a psychiatric consultation has been ordered by the primary provider. The patient was identified using two separate identifiers.  CHIEF COMPLAINT/REASON FOR CONSULT  "I am withdrawing from fentanyl"  HISTORY OF PRESENT ILLNESS (HPI)  The patient is a 27 year old female who presents with a complex substance use history, including significant abuse of fentanyl and Xanax, alongside THC and amphetamines.   Per ER note, Jacqueline Ellison is a 27 y.o. female.  She has history of opiate use disorder, has been using fentanyl for "years".  Brought in by police under IVC taken out by her mother.  Patient was reportedly at home stating she did not want to live anymore.  Has not used fentanyl since yesterday and is having nausea, abdominal cramping, diarrhea, body aches and anxiety.  Was seen this morning but refused treatment and walked out.  Patient denies suicidal thoughts, states she does not want to die, no HI.  She does have history of anxiety and postpartum depression per record review denies any fever, chills, other acute medical issues.  States symptoms feel like prior withdrawal.  Patient reports she has had Suboxone in the past but states that made her feel worse.   During this evaluation, patient reports the ineffectiveness of medication provided the previous day, indicating a high tolerance or significant use of fentanyl, and admits to daily use of 2 -3 milligrams of Xanax. Jacqueline Ellison last use of fentanyl remains unclear, as she cannot recall the timing. Despite her substance use, Jacqueline Ellison denies any thoughts of self-harm or harming others and she denies history of suicide attempts or mental health hospital admissions. Shane's treatment history includes past rehabilitation efforts, with outpatient treatment noted as effective. However, her mother initiated an Involuntary Commitment (IVC) following Jacqueline Ellison's decision  to leave the hospital against medical  advice. Currently, Jacqueline Ellison expresses discomfort and irritability, attributing these feelings to the lack of medication since yesterday. She shows a preference for outpatient treatment, declining the need for inpatient hospitalization. Jacqueline Ellison is currently living with her father and is unemployed. This provider attempted to contact patient's mother and father several times to get collateral information but was unable to reach them.  PAST PSYCHIATRIC HISTORY  she denies history of suicide attempts or mental health hospital admissions. Mistee's treatment history includes past rehabilitation efforts, with outpatient treatment noted as effective.  She denies having current outpatient psych services.  PAST MEDICAL HISTORY  Past Medical History:  Diagnosis Date   Anxiety    Arthritis    Chlamydia infection    Hepatitis C 07/16/2017   Marijuana use 01/14/2016   Counseled 01/14/2016    Repeat:___   Mental disorder    depression    HOME MEDICATIONS  Facility Ordered Medications  Medication   [COMPLETED] sodium chloride 0.9 % bolus 1,000 mL   [COMPLETED] ondansetron (ZOFRAN) injection 4 mg   cloNIDine (CATAPRES - Dosed in mg/24 hr) patch 0.1 mg   [COMPLETED] diphenhydrAMINE (BENADRYL) injection 50 mg   [COMPLETED] sodium chloride 0.9 % bolus 1,000 mL   [COMPLETED] dicyclomine (BENTYL) injection 20 mg   [COMPLETED] LORazepam (ATIVAN) tablet 1 mg   [COMPLETED] droperidol (INAPSINE) 2.5 MG/ML injection 1.25 mg   cloNIDine (CATAPRES) tablet 0.1 mg   prochlorperazine (COMPAZINE) tablet 5 mg   loperamide (IMODIUM) capsule 2 mg   ibuprofen (ADVIL) tablet 400 mg   traZODone (DESYREL) tablet 50 mg   diazepam (VALIUM) tablet 2 mg   PTA Medications  Medication Sig   ALPRAZolam (XANAX) 0.5 MG tablet Take 0.5 mg by mouth at bedtime as needed for anxiety.     ALLERGIES  Allergies  Allergen Reactions   Depo-Provera [Medroxyprogesterone Acetate] Other (See Comments)    Broke out with acne all over body.    Medroxyprogesterone Acetate    Penicillins Diarrhea   Tylenol [Acetaminophen] Nausea And Vomiting    SOCIAL & SUBSTANCE USE HISTORY  Social History   Socioeconomic History   Marital status: Single    Spouse name: Not on file   Number of children: Not on file   Years of education: Not on file   Highest education level: Not on file  Occupational History   Not on file  Tobacco Use   Smoking status: Every Day    Current packs/day: 0.00    Average packs/day: 0.3 packs/day for 5.0 years (1.3 ttl pk-yrs)    Types: Cigarettes    Start date: 06/15/2016    Last attempt to quit: 06/15/2021    Years since quitting: 2.0   Smokeless tobacco: Never  Vaping Use   Vaping status: Former  Substance and Sexual Activity   Alcohol use: Not Currently   Drug use: Yes    Comment: fentanyl "for years"   Sexual activity: Not Currently    Partners: Male    Birth control/protection: Surgical    Comment: tubal  Other Topics Concern   Not on file  Social History Narrative   Not on file   Social Determinants of Health   Financial Resource Strain: Medium Risk (08/28/2021)   Overall Financial Resource Strain (CARDIA)    Difficulty of Paying Living Expenses: Somewhat hard  Food Insecurity: No Food Insecurity (08/28/2021)   Hunger Vital Sign    Worried About Running Out of Food in the Last Year: Never true  Ran Out of Food in the Last Year: Never true  Transportation Needs: Unmet Transportation Needs (08/28/2021)   PRAPARE - Administrator, Civil Service (Medical): Yes    Lack of Transportation (Non-Medical): No  Physical Activity: Sufficiently Active (08/28/2021)   Exercise Vital Sign    Days of Exercise per Week: 6 days    Minutes of Exercise per Session: 30 min  Stress: Stress Concern Present (08/28/2021)   [employer] of Occupational Health - Occupational Stress Questionnaire    Feeling of Stress : To some extent  Social Connections: Moderately Isolated (08/28/2021)    Social Connection and Isolation Panel [NHANES]    Frequency of Communication with Friends and Family: Three times a week    Frequency of Social Gatherings with Friends and Family: Patient declined    Attends Religious Services: More than 4 times per year    Active Member of [employer] or Organizations: No    Attends Banker Meetings: Never    Marital Status: Separated   Social History   Tobacco Use  Smoking Status Every Day   Current packs/day: 0.00   Average packs/day: 0.3 packs/day for 5.0 years (1.3 ttl pk-yrs)   Types: Cigarettes   Start date: 06/15/2016   Last attempt to quit: 06/15/2021   Years since quitting: 2.0  Smokeless Tobacco Never   Social History   Substance and Sexual Activity  Alcohol Use Not Currently   Social History   Substance and Sexual Activity  Drug Use Yes   Comment: fentanyl "for years"    Additional pertinent information .  FAMILY HISTORY  Family History  Problem Relation Age of Onset   Hypertension Paternal Grandfather    Heart disease Paternal Grandfather    Hypertension Paternal Grandmother    Hypertension Maternal Grandmother    Diabetes Maternal Grandmother    Hypertension Maternal Grandfather    Aneurysm Maternal Grandfather    Hypertension Father    Hypertension Mother    Diabetes Mother    Hypertension Sister    Heart attack Maternal Uncle    Family Psychiatric History (if known):  denies  MENTAL STATUS EXAM (MSE)  Presentation  General Appearance: Appropriate for Environment Eye Contact:Poor Speech:Clear and Coherent Speech Volume:Normal Handedness:No data recorded  Mood and Affect  Mood:Labile; Irritable Affect:Congruent  Thought Process  Thought Processes:Coherent Descriptions of Associations:Intact  Orientation:Full (Time, Place and Person)  Thought Content:Logical  History of Schizophrenia/Schizoaffective disorder:No data recorded Duration of Psychotic Symptoms:No data  recorded Hallucinations:Hallucinations: None  Ideas of Reference:None  Suicidal Thoughts:Suicidal Thoughts: No  Homicidal Thoughts:Homicidal Thoughts: No   Sensorium  Memory:Recent Fair; Immediate Fair; Remote Fair Judgment:Poor Insight:Fair  Executive Functions  Concentration:Fair Attention Span:Fair Recall:Fair Fund of Knowledge:Fair Language:Good  Psychomotor Activity  Psychomotor Activity:Psychomotor Activity: Normal  Assets  Assets:Other (comment) (seeking help)  Sleep  Sleep:Sleep: Poor   VITALS  Blood pressure (!) 136/94, pulse 72, temperature 98.6 F (37 C), temperature source Oral, resp. rate 18, height 5\' 1"  (1.549 m), weight 54.4 kg, SpO2 100%, currently breastfeeding.  LABS  Admission on 06/17/2023  Component Date Value Ref Range Status   Sodium 06/17/2023 138  135 - 145 mmol/L Final   Potassium 06/17/2023 3.2 (L)  3.5 - 5.1 mmol/L Final   Chloride 06/17/2023 102  98 - 111 mmol/L Final   CO2 06/17/2023 24  22 - 32 mmol/L Final   Glucose, Bld 06/17/2023 97  70 - 99 mg/dL Final   Glucose reference range applies only to samples taken  after fasting for at least 8 hours.   BUN 06/17/2023 18  6 - 20 mg/dL Final   Creatinine, Ser 06/17/2023 0.68  0.44 - 1.00 mg/dL Final   Calcium 29/52/8413 9.2  8.9 - 10.3 mg/dL Final   Total Protein 24/40/1027 8.3 (H)  6.5 - 8.1 g/dL Final   Albumin 25/36/6440 4.8  3.5 - 5.0 g/dL Final   AST 34/74/2595 18  15 - 41 U/L Final   ALT 06/17/2023 13  0 - 44 U/L Final   Alkaline Phosphatase 06/17/2023 87  38 - 126 U/L Final   Total Bilirubin 06/17/2023 0.4  0.3 - 1.2 mg/dL Final   GFR, Estimated 06/17/2023 >60  >60 mL/min Final   Comment: (NOTE) Calculated using the CKD-EPI Creatinine Equation (2021)    Anion gap 06/17/2023 12  5 - 15 Final   Performed at [facility], [address], [place], [place] [address]   Alcohol, Ethyl (B) 06/17/2023 <10  <10 mg/dL Final   Comment: (NOTE) Lowest detectable limit for serum alcohol is  10 mg/dL.  For medical purposes only. Performed at [facility], [address], [place], [place] [address]    Opiates 06/17/2023 POSITIVE (A)  NONE DETECTED Final   Cocaine 06/17/2023 NONE DETECTED  NONE DETECTED Final   Benzodiazepines 06/17/2023 POSITIVE (A)  NONE DETECTED Final   Amphetamines 06/17/2023 POSITIVE (A)  NONE DETECTED Final   Tetrahydrocannabinol 06/17/2023 POSITIVE (A)  NONE DETECTED Final   Barbiturates 06/17/2023 NONE DETECTED  NONE DETECTED Final   Comment: (NOTE) DRUG SCREEN FOR MEDICAL PURPOSES ONLY.  IF CONFIRMATION IS NEEDED FOR ANY PURPOSE, NOTIFY LAB WITHIN 5 DAYS.  LOWEST DETECTABLE LIMITS FOR URINE DRUG SCREEN Drug Class                     Cutoff (ng/mL) Amphetamine and metabolites    1000 Barbiturate and metabolites    200 Benzodiazepine                 200 Opiates and metabolites        300 Cocaine and metabolites        300 THC                            50 Performed at [facility], [address], [place], [place] [address]    WBC 06/17/2023 10.3  4.0 - 10.5 K/uL Final   RBC 06/17/2023 5.13 (H)  3.87 - 5.11 MIL/uL Final   Hemoglobin 06/17/2023 14.4  12.0 - 15.0 g/dL Final   HCT 41/66/0630 42.6  36.0 - 46.0 % Final   MCV 06/17/2023 83.0  80.0 - 100.0 fL Final   MCH 06/17/2023 28.1  26.0 - 34.0 pg Final   MCHC 06/17/2023 33.8  30.0 - 36.0 g/dL Final   RDW 16/09/930 12.7  11.5 - 15.5 % Final   Platelets 06/17/2023 330  150 - 400 K/uL Final   nRBC 06/17/2023 0.0  0.0 - 0.2 % Final   Neutrophils Relative % 06/17/2023 74  % Final   Neutro Abs 06/17/2023 7.6  1.7 - 7.7 K/uL Final   Lymphocytes Relative 06/17/2023 21  % Final   Lymphs Abs 06/17/2023 2.2  0.7 - 4.0 K/uL Final   Monocytes Relative 06/17/2023 5  % Final   Monocytes Absolute 06/17/2023 0.5  0.1 - 1.0 K/uL Final   Eosinophils Relative 06/17/2023 0  % Final   Eosinophils Absolute 06/17/2023 0.0  0.0 - 0.5 K/uL Final   Basophils Relative 06/17/2023 0  % Final   Basophils Absolute  06/17/2023 0.0  0.0 - 0.1 K/uL Final   Immature Granulocytes 06/17/2023 0  % Final   Abs Immature Granulocytes 06/17/2023 0.03  0.00 - 0.07 K/uL Final   Performed at [facility], [address], [place], [place] [address]   Preg Test, Ur 06/17/2023 NEGATIVE  NEGATIVE Final   Comment:        THE SENSITIVITY OF THIS METHODOLOGY IS >24 mIU/mL     PSYCHIATRIC REVIEW OF SYSTEMS (ROS)  - Patient is alert and oriented to time and place. - Exhibits signs of restlessness. - Denies suicidal and homicidal ideation. - Expresses a desire to pursue outpatient substance abuse treatment. - Communication indicates possible frustration. - Reports feeling "crabby" and uncomfortable. - Aware of her location at [facility].  Additional findings:      Musculoskeletal: No abnormal movements observed      Gait & Station: Laying/Sitting      Pain Screening: Present - mild to moderate      Nutrition & Dental Concerns: Decrease in food intake and/or loss of appetite  RISK FORMULATION/ASSESSMENT  Is the patient experiencing any suicidal or homicidal ideations: No       Explain if yes:  Protective factors considered for safety management: access to appropriate clinical intervention  Risk factors/concerns considered for safety management:  Substance abuse/dependence Physical illness/chronic pain Impulsivity Unmarried  Is there a safety management plan with the patient and treatment team to minimize risk factors and promote protective factors: Yes           Explain: deferred at this time Is crisis care placement or psychiatric hospitalization recommended: deferred     Based on my current evaluation and risk assessment, patient is determined at this time to be at:  Moderate Risk  *RISK ASSESSMENT Risk assessment is a dynamic process; it is possible that this patient's condition, and risk level, may change. This should be re-evaluated and managed over time as appropriate. Please re-consult  psychiatric consult services if additional assistance is needed in terms of risk assessment and management. If your team decides to discharge this patient, please advise the patient how to best access emergency psychiatric services, or to call 911, if their condition worsens or they feel unsafe in any way.   Norval Morton, NP Telepsychiatry Consult Services

## 2023-06-18 NOTE — ED Provider Notes (Signed)
Emergency Medicine Observation Re-evaluation Note  Jacqueline Ellison is a 27 y.o. female, seen on rounds today.  Pt initially presented to the ED for complaints of V70.1 Currently, the patient is awake and alert.  She wants to go home.  She is under IVC that was taken out by her mother.  She does not believe her mother would do that.  TTS has evaluated her, but they want to talk to her mother.  Her mother has not been answering the phone.  Physical Exam  BP 121/82 (BP Location: Left Arm)   Pulse 78   Temp 98.3 F (36.8 C) (Oral)   Resp 16   Ht 5\' 1"  (1.549 m)   Wt 54.4 kg   SpO2 100%   BMI 22.67 kg/m  Physical Exam General: awake and alert Cardiac: rr Lungs: clear Psych: agitated  ED Course / MDM  EKG:   I have reviewed the labs performed to date as well as medications administered while in observation.  Recent changes in the last 24 hours include TTS eval.  Plan  Current plan is for likely d/c home with outpatient f/u, but TTS trying to get ahold of mother for further info.    Jacalyn Lefevre, MD 06/18/23 [phone]

## 2023-06-19 DIAGNOSIS — F1193 Opioid use, unspecified with withdrawal: Secondary | ICD-10-CM

## 2023-06-19 NOTE — Consult Note (Signed)
Telepsych Consultation   Reason for Consult:  Psychiatric Consult  Referring Physician:  Dr. Eber Hong Location of Patient: [facility]  Location of Provider: Other: [facility]   Patient Identification: Jacqueline Ellison MRN:  [id number] Principal Diagnosis: Opioid dependence with withdrawal [facility]) Diagnosis:  Principal Problem:   Opioid dependence with withdrawal (HCC) Active Problems:   Benzodiazepine dependence (HCC)   Opiate withdrawal (HCC)   Total Time spent with patient: 45 minutes  Subjective:   Jacqueline Ellison is a 27 y.o. female patient admitted with Opioid Dependence in active withdrawal. Patient attempted to leave the facility while in active withdrawals, parents obtained an IVC petition due to alleged suicidal ideations and recurrent substance abuse with concerns patient was risk for self harm.  Jacqueline Ellison, 27 y.o., female patient seen via tele health by this provider, consulted with Dr. Clovis Riley ; and chart reviewed on 06/19/23.    HPI:   During re-evaluation Jacqueline Ellison is sitting upright in bed  in no acute distress.  She is alert, oriented x 4, calm and cooperative.She denies any active withdrawal symptoms and reports after being in the hospital for 3 days she is feel better and ready for discharge. Patient was seen by [facility] provider 2 days ago who recommended disposition pending  collateral from mother. Patient was not re-evaluated yesterday although provided made attempts to secure collateral from mother and was unsuccessful on yesterday. Patient is frustrated as she states "I've never tried to kill myself and I don't want to die". She endorses that she has a children however doesn't have custody of them. She lives with someone and doesn't disclose the relationship of the person she lives with. Her grandmother has custody of her children. Patient has no history of psychiatric admission. Endorses achieving sobriety  though an outpatient treatment clinic that treated he with Suboxone and she prefers to go through an outpatient treatment facility again. Patient is able to contract for safety and gives this Clinical research associate permission to speak with mother Jacqueline Ellison. She declines transfer to [facility].   This Clinical research associate spoke with both mother Jacqueline Ellison and father, Jacqueline Ellison who both endorses that IVC was petitioned on patient due to excessive drug use an there desire to forces her to receive treatment. Jacqueline Ellison stated that patient made suicidal statements while appearing under the influence of  a substance. Parents confirm that patient has no diagnose psychiatric history and no history of suicidal behavior or psychiatric admissions.    Patient has remained calm throughout assessment and has answered questions appropriately.     At this time Jacqueline Ellison is educated and verbalizes understanding of mental health resources and other crisis services in the community. She is instructed to call 911 and present to the nearest emergency room should she experience any suicidal/homicidal ideation, auditory/visual/hallucinations, or detrimental worsening of her mental health condition.  She was a also advised by Clinical research associate that he could call the toll-free phone on insurance card to assist with identifying in network counselors and agencies or number on back of insurance card to speak with care coordinator     Past Psychiatric History: No pertinent psychiatric history   Risk to Self:  No  Risk to Others:  Np  Prior Inpatient Therapy:  None  Prior Outpatient Therapy:  Outpatient substance treatment several years back and achieved sobriety with Suboxone   Past Medical History:  Past Medical History:  Diagnosis Date  Anxiety    Arthritis    Chlamydia infection    Hepatitis C 07/16/2017   Marijuana use 01/14/2016   Counseled 01/14/2016    Repeat:___   Mental disorder    depression    Past Surgical History:   Procedure Laterality Date   BLADDER SURGERY     bladder stretch 2001?   CESAREAN SECTION N/A 04/25/2016   Procedure: CESAREAN SECTION;  Surgeon: Reva Bores, MD;  Location: [facility];  Service: Obstetrics;  Laterality: N/A;   CESAREAN SECTION N/A 07/24/2021   Procedure: CESAREAN SECTION;  Surgeon: Catalina Antigua, MD;  Location: [facility] LD ORS;  Service: Obstetrics;  Laterality: N/A;   HERNIA REPAIR     HERNIA REPAIR  2009   WISDOM TOOTH EXTRACTION  2009   Family History:  Family History  Problem Relation Age of Onset   Hypertension Paternal Grandfather    Heart disease Paternal Grandfather    Hypertension Paternal Grandmother    Hypertension Maternal Grandmother    Diabetes Maternal Grandmother    Hypertension Maternal Grandfather    Aneurysm Maternal Grandfather    Hypertension Father    Hypertension Mother    Diabetes Mother    Hypertension Sister    Heart attack Maternal Uncle    Family Psychiatric  History: See above  Social History:  Social History   Substance and Sexual Activity  Alcohol Use Not Currently     Social History   Substance and Sexual Activity  Drug Use Yes   Comment: fentanyl "for years"    Social History   Socioeconomic History   Marital status: Single    Spouse name: Not on file   Number of children: Not on file   Years of education: Not on file   Highest education level: Not on file  Occupational History   Not on file  Tobacco Use   Smoking status: Every Day    Current packs/day: 0.00    Average packs/day: 0.3 packs/day for 5.0 years (1.3 ttl pk-yrs)    Types: Cigarettes    Start date: 06/15/2016    Last attempt to quit: 06/15/2021    Years since quitting: 2.0   Smokeless tobacco: Never  Vaping Use   Vaping status: Former  Substance and Sexual Activity   Alcohol use: Not Currently   Drug use: Yes    Comment: fentanyl "for years"   Sexual activity: Not Currently    Partners: Male    Birth control/protection: Surgical     Comment: tubal  Other Topics Concern   Not on file  Social History Narrative   Not on file   Social Determinants of Health   Financial Resource Strain: Medium Risk (08/28/2021)   Overall Financial Resource Strain (CARDIA)    Difficulty of Paying Living Expenses: Somewhat hard  Food Insecurity: No Food Insecurity (08/28/2021)   Hunger Vital Sign    Worried About Running Out of Food in the Last Year: Never true    Ran Out of Food in the Last Year: Never true  Transportation Needs: Unmet Transportation Needs (08/28/2021)   PRAPARE - Administrator, Civil Service (Medical): Yes    Lack of Transportation (Non-Medical): No  Physical Activity: Sufficiently Active (08/28/2021)   Exercise Vital Sign    Days of Exercise per Week: 6 days    Minutes of Exercise per Session: 30 min  Stress: Stress Concern Present (08/28/2021)   [employer] of Occupational Health - Occupational Stress Questionnaire    Feeling of  Stress : To some extent  Social Connections: Moderately Isolated (08/28/2021)   Social Connection and Isolation Panel [NHANES]    Frequency of Communication with Friends and Family: Three times a week    Frequency of Social Gatherings with Friends and Family: Patient declined    Attends Religious Services: More than 4 times per year    Active Member of [employer] or Organizations: No    Attends Banker Meetings: Never    Marital Status: Separated   Additional Social History:    Allergies:   Allergies  Allergen Reactions   Depo-Provera [Medroxyprogesterone Acetate] Other (See Comments)    Broke out with acne all over body.   Medroxyprogesterone Acetate    Penicillins Diarrhea   Tylenol [Acetaminophen] Nausea And Vomiting    Labs:  Results for orders placed or performed during the hospital encounter of 06/17/23 (from the past 48 hour(s))  Urine rapid drug screen (hosp performed)     Status: Abnormal   Collection Time: 06/17/23 11:50 PM  Result  Value Ref Range   Opiates POSITIVE (A) NONE DETECTED   Cocaine NONE DETECTED NONE DETECTED   Benzodiazepines POSITIVE (A) NONE DETECTED   Amphetamines POSITIVE (A) NONE DETECTED   Tetrahydrocannabinol POSITIVE (A) NONE DETECTED   Barbiturates NONE DETECTED NONE DETECTED    Comment: (NOTE) DRUG SCREEN FOR MEDICAL PURPOSES ONLY.  IF CONFIRMATION IS NEEDED FOR ANY PURPOSE, NOTIFY LAB WITHIN 5 DAYS.  LOWEST DETECTABLE LIMITS FOR URINE DRUG SCREEN Drug Class                     Cutoff (ng/mL) Amphetamine and metabolites    1000 Barbiturate and metabolites    200 Benzodiazepine                 200 Opiates and metabolites        300 Cocaine and metabolites        300 THC                            50 Performed at [facility], [address] [place], [place] [address]   POC urine preg, ED     Status: None   Collection Time: 06/17/23 11:55 PM  Result Value Ref Range   Preg Test, Ur NEGATIVE NEGATIVE    Comment:        THE SENSITIVITY OF THIS METHODOLOGY IS >24 mIU/mL     Medications:  No current facility-administered medications for this encounter.   Current Outpatient Medications  Medication Sig Dispense Refill   ALPRAZolam (XANAX) 0.5 MG tablet Take 0.5 mg by mouth at bedtime as needed for anxiety.     fentaNYL (SUBLIMAZE) 500 MCG/10ML injection Inject 500 mcg into the vein as needed for pain.     dicyclomine (BENTYL) 20 MG tablet Take 1 tablet (20 mg total) by mouth 2 (two) times daily. (Patient not taking: Reported on 06/17/2023) 20 tablet 0   naloxone (NARCAN) nasal spray 4 mg/0.1 mL Place 1 spray into the nose as needed. (Patient not taking: Reported on 06/17/2023) 1 each 0   ondansetron (ZOFRAN) 4 MG tablet Take 1 tablet (4 mg total) by mouth every 6 (six) hours. (Patient not taking: Reported on 06/17/2023) 12 tablet 0    Musculoskeletal: Strength & Muscle Tone: within normal limits Gait & Station: normal Patient leans: N/A          Psychiatric Specialty  Exam:  Presentation  General Appearance:  Appropriate for Environment  Eye Contact: Poor  Speech: Clear and Coherent  Speech Volume: Normal  Handedness:No data recorded  Mood and Affect  Mood: Labile; Irritable  Affect: Congruent   Thought Process  Thought Processes: Coherent  Descriptions of Associations:Intact  Orientation:Full (Time, Place and Person)  Thought Content:Logical  History of Schizophrenia/Schizoaffective disorder:No data recorded Duration of Psychotic Symptoms:No data recorded Hallucinations:Hallucinations: None  Ideas of Reference:None  Suicidal Thoughts:Suicidal Thoughts: No  Homicidal Thoughts:Homicidal Thoughts: No   Sensorium  Memory: Recent Fair; Immediate Fair; Remote Fair  Judgment: Poor  Insight: Fair   Chartered certified accountant: Fair  Attention Span: Fair  Recall: [employer] of Knowledge: Fair  Language: Good   Psychomotor Activity  Psychomotor Activity: Psychomotor Activity: Normal   Assets  Assets: Other (comment) (seeking help)   Sleep  Sleep: Sleep: Poor    Physical Exam: Physical Exam Constitutional:      Appearance: Normal appearance.  HENT:     Head: Normocephalic and atraumatic.     Nose: Nose normal.  Eyes:     Extraocular Movements: Extraocular movements intact.     Conjunctiva/sclera: Conjunctivae normal.     Pupils: Pupils are equal, round, and reactive to light.  Cardiovascular:     Rate and Rhythm: Normal rate.  Pulmonary:     Effort: Pulmonary effort is normal.  Neurological:     Mental Status: She is alert and oriented to person, place, and time.  Psychiatric:        Mood and Affect: Mood normal.        Behavior: Behavior normal.    Review of Systems  Psychiatric/Behavioral:  Positive for substance abuse.     Blood pressure 122/68, pulse 84, temperature 98.4 F (36.9 C), temperature source Oral, resp. rate 17, height 5\' 1"  (1.549 m), weight 54.4 kg,  SpO2 98%, currently breastfeeding. Body mass index is 22.67 kg/m.  Treatment Plan Summary: Plan Discharge with resources for outpatient substance treatment. On evaluation, patient doesn't appear to be in the "ready state" of the recovery phase as patient is oppositional to structured treatment and declines controlled detox. Patient is high risk or relapse, however unable to uphold IVC petition as patient is not psychotic and is not an obvious danger to herself as she is able to make decision with use of sound mind. Disposition: No evidence of imminent risk to self or others at present.   Patient does not meet criteria for psychiatric inpatient admission. Supportive therapy provided about ongoing stressors. Discussed crisis plan, support from social network, calling 911, coming to the Emergency Department, and calling Suicide Hotline.  This service was provided via telemedicine using a 2-way, interactive audio and video technology.  Names of all persons participating in this telemedicine service and their role in this encounter. Name: Jacqueline Ellison  Role: Patient   Name: Arby Barrette  Role:  Paramedic   Name: Joaquin Courts, NP  Role: Nurse Practitioner     Joaquin Courts, NP 06/19/2023 9:38 PM

## 2023-06-19 NOTE — ED Notes (Signed)
Pt has expressed she is very upset and she would rather be any where but here, she is asking to go home, or to jail. She does not believe that her mother has IVC'd her or that she has to stay in this ED. She is continually threatening to leave.

## 2023-06-19 NOTE — ED Notes (Signed)
Pt is eating breakfast, and getting dressed belongings returned to patient and she is waiting for father to pick her up to be discharged.

## 2023-06-19 NOTE — Discharge Instructions (Addendum)
read the instructions provided on opioid use disorder and benzo withdrawals.  Provided his the information on [facility], that has a 24/7 walk-in urgent care in [place].  They have further treatment and rehab options for substance use disorder. Additional resources are below:  Substance Abuse Treatment Programs  Intensive Outpatient Programs [facility]     [address]      [place], [place]                   [phone]       [facility] [address] [place] [address] [place], [place] [phone]  [facility]     (Inpatient and outpatient)     [address]           [phone]    [facility] [phone] (Suboxone and Methadone)  [address]      [place], [place] [address]      [phone]       [address] [place], [place] [phone]  [facility] (Outpatient/Inpatient, [facility])    (insurance only) [phone]             [facility] (Groups & Residential) [place], [place] [phone]     [facility]     [address]     [place], [place]      [phone]       [facility] (for caregivers and family) [phone] [address] [place], [place] [phone]      Residential Treatment Programs [facility]      [address], [place], [place] [address]  [phone]       [facility] [address] [place], [place] [address] [phone]  [facility] [place]        [phone]       [facility] [phone]  [facility] ([facility]             [address]                                         [place], [place]                                                [phone] or [phone]                               [facility] [address] [place], [address] [phone]  [facility]    [address]      [place], [place]     [phone]       [facility] [address] [place], [place] [phone]  [facility]   [address] [place], [place] [address]     [phone]      Admissions: 8am-3pm M-F  [facility] ([facility]) [address] [place], [place] [phone]  [facility]: Residential Program [phone] Days)   [place], [place]      [phone] or [phone]     [facility]: [facility] [place], [place] (Walk in Hours over the weekend or by referral)  [facility] [address] [place], [place], [place] [address] [phone]  Crisis Mobile: [facility]:  [phone] (for crisis response 24 hours a day) [facility] Hotline:      7137217335 Outpatient Psychiatry and Counseling  [facility]: Mobile Crisis Management 24 hours:  [phone]  [facility] of the [employer] sliding scale fee and walk in schedule: M-F 8am-12pm/1pm-3pm [address]  [place], [place] [address] [phone]  [facility] [address] [place], [place] [address] [phone]  [facility] ([facility])- new patient walk-in appointments available Monday - Friday 8am -3pm.          [address] [place], [place] [address] [phone] or crisis line- [phone]  [facility] Services/ Intensive Outpatient Therapy Program [address] [place], [place] [address] [phone]  [facility]                  Crisis Services      [phone] [address]     [place], [place] [address]                 [facility]   [facility] [phone]. [address] [place], [place] [address]   [facility]          [address] [place]  [place], [place] [address]       [phone]  [facility] [address] [place], [place]  [address] [phone]  [facility]    [address]    [place], [place] [address]     [phone]       Andee Poles, MD     [address]     [place] [address]     [phone]       [facility] [address] [place] [address]  [facility]     [address] [place], [place]      [phone]       [facility] Eulogio Ditch, MD [address] [place], [place] [address] [phone]  [facility]     [address]     [place], [place] [address]     [phone]       [facility] [address] [place], [place] [address] [phone] Resources for Temporary Residential Assistance/Crisis Centers  [facility]) M-F 8am-3pm   [address] [place], [place] [address]   [phone] Services include: laundry, barbering, support groups, case management, phone  & computer access, showers, AA/NA mtgs, mental health/substance abuse nurse, job skills class, disability information, VA assistance, spiritual classes, etc.

## 2023-06-19 NOTE — ED Notes (Signed)
Pt stated she did not want her vitals taken she was ready to go. Pt went to lobby and waited for her dad.

## 2023-06-19 NOTE — ED Provider Notes (Signed)
Emergency Medicine Observation Re-evaluation Note  Jacqueline Ellison is a 27 y.o. female, seen on rounds today.  Pt initially presented to the ED for complaints of V70.1 Currently, the patient is awake.  She is slightly agitated, wondering why she is still in the emergency room.  Frustrated that our team has not been able to get in touch with her family members, despite his sister working here. Patient also complaining of nausea.  Physical Exam  BP 122/68 (BP Location: Right Arm)   Pulse 84   Temp 98.4 F (36.9 C) (Oral)   Resp 17   Ht 5\' 1"  (1.549 m)   Wt 54.4 kg   SpO2 98%   BMI 22.67 kg/m  Physical Exam General: No distress, but agitated Cardiac: Regular rate Lungs: No respiratory distress Psych: Slightly agitated, but directable  ED Course / MDM  EKG:   I have reviewed the labs performed to date as well as medications administered while in observation.  Recent changes in the last 24 hours include -psychiatry observing the patient overnight. Patient was brought in for substance use disorder.  She is involuntarily committed. However, psychiatry team unable to get collateral history.   Plan  Current plan is for psychiatry clearance. Nursing staff informed me that they were able to get updated phone number for the mother, and provided it to the psychiatry team yesterday in the daytime.  Unclear what transpired after that information was provided.  Patient getting agitated, she does not want to be here.  8:51 AM Psychiatry service has cleared the patient. Patient made aware of this recommendation.  She is happy to hear that.  She wants to take a shower and leave. Patient does not want any further assistance from the ED, including for substance use disorder or withdrawals. Patient is not in any significant distress right now requiring additional medical care.  Outpatient resources provided.    Derwood Kaplan, MD 06/19/23 [phone]

## 2023-09-07 ENCOUNTER — Other Ambulatory Visit: Payer: Self-pay

## 2023-09-07 ENCOUNTER — Encounter (HOSPITAL_COMMUNITY): Payer: Self-pay | Admitting: *Deleted

## 2023-09-07 ENCOUNTER — Emergency Department (HOSPITAL_COMMUNITY)
Admission: EM | Admit: 2023-09-07 | Discharge: 2023-09-07 | Disposition: A | Discharge status: Home / Self Care | Payer: Self-pay | Attending: Emergency Medicine | Admitting: Emergency Medicine

## 2023-09-07 DIAGNOSIS — F1910 Other psychoactive substance abuse, uncomplicated: Secondary | ICD-10-CM | POA: Insufficient documentation

## 2023-09-07 DIAGNOSIS — R7989 Other specified abnormal findings of blood chemistry: Secondary | ICD-10-CM

## 2023-09-07 DIAGNOSIS — R7401 Elevation of levels of liver transaminase levels: Secondary | ICD-10-CM | POA: Insufficient documentation

## 2023-09-07 LAB — SALICYLATE LEVEL: Salicylate Lvl: <7 mg/dL — ABNORMAL LOW (ref 7.0–30.0)

## 2023-09-07 LAB — CBC
HCT: 45.4 % (ref 36.0–46.0)
Hemoglobin: 14.8 g/dL (ref 12.0–15.0)
MCH: 26.2 pg (ref 26.0–34.0)
MCHC: 32.6 g/dL (ref 30.0–36.0)
MCV: 80.4 fL (ref 80.0–100.0)
Platelets: 451 10*3/uL — ABNORMAL HIGH (ref 150–400)
RBC: 5.65 MIL/uL — ABNORMAL HIGH (ref 3.87–5.11)
RDW: 13.9 % (ref 11.5–15.5)
WBC: 8.7 10*3/uL (ref 4.0–10.5)
nRBC: 0 % (ref 0.0–0.2)

## 2023-09-07 LAB — COMPREHENSIVE METABOLIC PANEL
ALT: 188 U/L — ABNORMAL HIGH (ref 0–44)
AST: 118 U/L — ABNORMAL HIGH (ref 15–41)
Albumin: 4.1 g/dL (ref 3.5–5.0)
Alkaline Phosphatase: 120 U/L (ref 38–126)
Anion gap: 15 (ref 5–15)
BUN: 16 mg/dL (ref 6–20)
CO2: 23 mmol/L (ref 22–32)
Calcium: 9.4 mg/dL (ref 8.9–10.3)
Chloride: 100 mmol/L (ref 98–111)
Creatinine, Ser: 0.78 mg/dL (ref 0.44–1.00)
GFR, Estimated: >60 mL/min (ref >60)
Glucose, Bld: 93 mg/dL (ref 70–99)
Potassium: 3.1 mmol/L — ABNORMAL LOW (ref 3.5–5.1)
Sodium: 138 mmol/L (ref 135–145)
Total Bilirubin: 1 mg/dL (ref <1.2)
Total Protein: 8.8 g/dL — ABNORMAL HIGH (ref 6.5–8.1)

## 2023-09-07 LAB — ETHANOL: Alcohol, Ethyl (B): <10 mg/dL (ref <10)

## 2023-09-07 LAB — ACETAMINOPHEN LEVEL: Acetaminophen (Tylenol), Serum: <10 ug/mL — ABNORMAL LOW (ref 10–30)

## 2023-09-07 MED ORDER — ONDANSETRON 4 MG PO TBDP: 4.0000 mg | ORAL_TABLET | Freq: Three times a day (TID) | ORAL | 0 refills | Status: AC | PRN

## 2023-09-07 MED ORDER — HYDROXYZINE HCL 25 MG PO TABS
25.0000 mg | ORAL_TABLET | Freq: Once | ORAL | Status: AC
  Administered 2023-09-07: 25 mg via ORAL
  Filled 2023-09-07: qty 1

## 2023-09-07 MED ORDER — HYDROXYZINE HCL 25 MG PO TABS: 25.0000 mg | ORAL_TABLET | Freq: Four times a day (QID) | ORAL | 0 refills | Status: AC

## 2023-09-07 MED ORDER — DICYCLOMINE HCL 20 MG PO TABS: 20.0000 mg | ORAL_TABLET | Freq: Two times a day (BID) | ORAL | 0 refills | Status: AC

## 2023-09-07 NOTE — Discharge Instructions (Addendum)
It was a pleasure taking care of you here in the emergency department.  As we discussed I have written you for a few medications to help if you develop any withdrawal symptoms.  Attached to your discharge paperwork or resource guides for inpatient and outpatient detox.  You did have some elevated liver function test I would recommend holding off on any Tylenol or alcohol use as this can increase them.  Follow-up outpatient, return for any worsening symptoms  Return for any worsening symptoms.

## 2023-09-07 NOTE — ED Provider Notes (Signed)
[place] EMERGENCY DEPARTMENT AT [facility] Provider Note   CSN: [id number] Arrival date & time: 09/07/23  1756    History  Chief Complaint  Patient presents with   Medical Clearance    Jacqueline Ellison is a 27 y.o. female history of polysubstance use, depression here for evaluation of requesting detox.  Patient states she uses fentanyl, last use less than 24 hours ago.  She states she wants to "get clean and sober."  She denies any headache, nausea, vomiting, abdominal pain, chest pain, shortness of breath, diarrhea.  States she drank "a little alcohol" yesterday however denies any chronic use, DTs or withdrawal seizures she is requesting Ativan by name.  She does have a history of benzodiazepine dependence.  She denies any SI, HI, AVH.  Patient states she has been using for several years, sometimes injecting, sometimes.  She states she has used Suboxone previously which "made me feel worse."  She does not want it.  HPI     Home Medications Prior to Admission medications   Medication Sig Start Date End Date Taking? Authorizing Provider  dicyclomine (BENTYL) 20 MG tablet Take 1 tablet (20 mg total) by mouth 2 (two) times daily. 09/07/23  Yes Javana Schey A, PA-C  hydrOXYzine (ATARAX) 25 MG tablet Take 1 tablet (25 mg total) by mouth every 6 (six) hours. 09/07/23  Yes Tationa Stech A, PA-C  ondansetron (ZOFRAN-ODT) 4 MG disintegrating tablet Take 1 tablet (4 mg total) by mouth every 8 (eight) hours as needed for nausea or vomiting. 09/07/23  Yes Aliece Honold A, PA-C  fentaNYL (SUBLIMAZE) 500 MCG/10ML injection Inject 500 mcg into the vein as needed for pain.    [provider]  naloxone [facility]) nasal spray 4 mg/0.1 mL Place 1 spray into the nose as needed. Patient not taking: Reported on 06/17/2023 06/17/23   Carmel Sacramento A, PA-C      Allergies    Depo-provera [medroxyprogesterone acetate], Medroxyprogesterone acetate, Penicillins, and Tylenol  [acetaminophen]    Review of Systems   Review of Systems  Constitutional: Negative.   HENT: Negative.    Respiratory: Negative.    Cardiovascular: Negative.   Gastrointestinal: Negative.   Genitourinary: Negative.   Musculoskeletal: Negative.   Skin: Negative.   Neurological: Negative.   All other systems reviewed and are negative.   Physical Exam Updated Vital Signs BP (!) 129/95 (BP Location: Right Arm)   Pulse 78   Temp 98.4 F (36.9 C) (Oral)   Resp 18   Ht 5\' 1"  (1.549 m)   Wt 54.4 kg   LMP  (Within Months)   SpO2 100%   BMI 22.67 kg/m  Physical Exam Vitals and nursing note reviewed.  Constitutional:      General: She is not in acute distress.    Appearance: She is well-developed. She is not ill-appearing, toxic-appearing or diaphoretic.  HENT:     Head: Atraumatic.  Eyes:     Pupils: Pupils are equal, round, and reactive to light.  Cardiovascular:     Rate and Rhythm: Normal rate.  Pulmonary:     Effort: Pulmonary effort is normal. No respiratory distress.     Breath sounds: Normal breath sounds.  Abdominal:     General: Bowel sounds are normal. There is no distension.     Palpations: Abdomen is soft.  Musculoskeletal:        General: Normal range of motion.     Cervical back: Normal range of motion.  Skin:  General: Skin is warm and dry.     Capillary Refill: Capillary refill takes less than 2 seconds.  Neurological:     General: No focal deficit present.     Mental Status: She is alert.  Psychiatric:        Attention and Perception: Attention normal.        Mood and Affect: Mood and affect normal.        Speech: Speech normal.        Behavior: Behavior normal. Behavior is cooperative.        Thought Content: Thought content normal.        Judgment: Judgment normal.     Comments: Denies SI, HI Cooperative Does not appear to be responding to internal stimuli Normal mood and affect     ED Results / Procedures / Treatments   Labs (all labs  ordered are listed, but only abnormal results are displayed) Labs Reviewed  COMPREHENSIVE METABOLIC PANEL - Abnormal; Notable for the following components:      Result Value   Potassium 3.1 (*)    Total Protein 8.8 (*)    AST 118 (*)    ALT 188 (*)    All other components within normal limits  SALICYLATE LEVEL - Abnormal; Notable for the following components:   Salicylate Lvl <7.0 (*)    All other components within normal limits  ACETAMINOPHEN LEVEL - Abnormal; Notable for the following components:   Acetaminophen (Tylenol), Serum <10 (*)    All other components within normal limits  CBC - Abnormal; Notable for the following components:   RBC 5.65 (*)    Platelets 451 (*)    All other components within normal limits  ETHANOL  RAPID URINE DRUG SCREEN, HOSP PERFORMED  POC URINE PREG, ED    EKG None  Radiology No results found.  Procedures Procedures    Medications Ordered in ED Medications  hydrOXYzine (ATARAX) tablet 25 mg (has no administration in time range)    ED Course/ Medical Decision Making/ A&P   27 year old history of polysubstance use here for evaluation of requesting detox.  She uses fentanyl, last use less than 24 hours ago.  Also admits to drinking alcohol yesterday.  She is currently asymptomatic, denies headache, nausea, vomiting, chest pain, shortness of breath, myalgias, abdominal pain, diarrhea.  She is requesting Ativan by name on her initial exam as she states it helps her to "settle me out."  She is tolerating p.o. intake here.  Labs and imaging personally viewed and interpreted:  CBC without leukocytosis 9 metabolic panel potassium 3.1, elevated LFTs with normal alk phos, T. Bili Ethanol, acetaminophen, salicylate within normal limits  I discussed results with patient.  She has no abdominal pain, nausea or vomiting.  Do not feel she needs imaging for elevated LFTs.  She does not want any additional blood work taken to add on hepatitis panel.   Denies any significant EtOH use or Tylenol use  We discussed her polysubstance use.  She is requesting Ativan by name however does have a history of benzodiazepine use.  I offered hydroxyzine.  She denies any SI, HI, AVH on multiple assessments.  I have given her resources for outpatient follow-up.  We did discuss Suboxone which she declined she states makes her feel "worse than ever."  I encouraged her to follow-up with outpatient resources.  She will return for new or worsening symptoms   COWS score 2  Medical Decision Making Amount and/or Complexity of Data Reviewed External Data Reviewed: labs and notes. Labs: ordered. Decision-making details documented in ED Course.  Risk OTC drugs. Prescription drug management. Decision regarding hospitalization. Diagnosis or treatment significantly limited by social determinants of health.          Final Clinical Impression(s) / ED Diagnoses Final diagnoses:  Polysubstance abuse (HCC)  Elevated LFTs    Rx / DC Orders ED Discharge Orders          Ordered    hydrOXYzine (ATARAX) 25 MG tablet  Every 6 hours        09/07/23 2002    dicyclomine (BENTYL) 20 MG tablet  2 times daily        09/07/23 2002    ondansetron (ZOFRAN-ODT) 4 MG disintegrating tablet  Every 8 hours PRN        09/07/23 2002              Kylin Genna A, PA-C 09/07/23 2026    Loetta Rough, MD 09/08/23 [phone]

## 2023-09-07 NOTE — ED Notes (Signed)
Pt given her fathers number that is listed in her chart so she can find a ride home.

## 2023-09-07 NOTE — ED Triage Notes (Signed)
Pt reports she is here to "go to rehab". Reports she uses Fentanyl and last used it yesterday.

## 2023-09-07 NOTE — ED Notes (Signed)
Pt given her belongings with D/C paperwork.

## 2023-09-08 ENCOUNTER — Encounter (HOSPITAL_COMMUNITY): Payer: Self-pay

## 2023-09-08 ENCOUNTER — Emergency Department (HOSPITAL_COMMUNITY)
Admission: EM | Admit: 2023-09-08 | Discharge: 2023-09-08 | Disposition: A | Discharge status: Home / Self Care | Payer: Self-pay | Attending: Emergency Medicine | Admitting: Emergency Medicine

## 2023-09-08 ENCOUNTER — Other Ambulatory Visit: Payer: Self-pay

## 2023-09-08 DIAGNOSIS — Z79899 Other long term (current) drug therapy: Secondary | ICD-10-CM | POA: Insufficient documentation

## 2023-09-08 DIAGNOSIS — J181 Lobar pneumonia, unspecified organism: Secondary | ICD-10-CM | POA: Insufficient documentation

## 2023-09-08 DIAGNOSIS — F17210 Nicotine dependence, cigarettes, uncomplicated: Secondary | ICD-10-CM | POA: Insufficient documentation

## 2023-09-08 DIAGNOSIS — H9209 Otalgia, unspecified ear: Secondary | ICD-10-CM | POA: Insufficient documentation

## 2023-09-08 DIAGNOSIS — F1910 Other psychoactive substance abuse, uncomplicated: Secondary | ICD-10-CM | POA: Insufficient documentation

## 2023-09-08 DIAGNOSIS — R739 Hyperglycemia, unspecified: Secondary | ICD-10-CM | POA: Insufficient documentation

## 2023-09-08 DIAGNOSIS — E876 Hypokalemia: Secondary | ICD-10-CM | POA: Insufficient documentation

## 2023-09-08 DIAGNOSIS — F1920 Other psychoactive substance dependence, uncomplicated: Secondary | ICD-10-CM | POA: Insufficient documentation

## 2023-09-08 DIAGNOSIS — F1990 Other psychoactive substance use, unspecified, uncomplicated: Secondary | ICD-10-CM

## 2023-09-08 DIAGNOSIS — F10239 Alcohol dependence with withdrawal, unspecified: Secondary | ICD-10-CM | POA: Insufficient documentation

## 2023-09-08 LAB — CBC WITH DIFFERENTIAL/PLATELET
Abs Immature Granulocytes: 0.03 10*3/uL (ref 0.00–0.07)
Basophils Absolute: 0.1 10*3/uL (ref 0.0–0.1)
Basophils Relative: 1 %
Eosinophils Absolute: 0.3 10*3/uL (ref 0.0–0.5)
Eosinophils Relative: 3 %
HCT: 36.4 % (ref 36.0–46.0)
Hemoglobin: 12 g/dL (ref 12.0–15.0)
Immature Granulocytes: 0 %
Lymphocytes Relative: 32 %
Lymphs Abs: 3.1 10*3/uL (ref 0.7–4.0)
MCH: 26.6 pg (ref 26.0–34.0)
MCHC: 33 g/dL (ref 30.0–36.0)
MCV: 80.7 fL (ref 80.0–100.0)
Monocytes Absolute: 0.6 10*3/uL (ref 0.1–1.0)
Monocytes Relative: 6 %
Neutro Abs: 5.5 10*3/uL (ref 1.7–7.7)
Neutrophils Relative %: 58 %
Platelets: 337 10*3/uL (ref 150–400)
RBC: 4.51 MIL/uL (ref 3.87–5.11)
RDW: 13.9 % (ref 11.5–15.5)
WBC: 9.6 10*3/uL (ref 4.0–10.5)
nRBC: 0 % (ref 0.0–0.2)

## 2023-09-08 LAB — COMPREHENSIVE METABOLIC PANEL
ALT: 132 U/L — ABNORMAL HIGH (ref 0–44)
AST: 71 U/L — ABNORMAL HIGH (ref 15–41)
Albumin: 3.4 g/dL — ABNORMAL LOW (ref 3.5–5.0)
Alkaline Phosphatase: 91 U/L (ref 38–126)
Anion gap: 11 (ref 5–15)
BUN: 10 mg/dL (ref 6–20)
CO2: 23 mmol/L (ref 22–32)
Calcium: 8.7 mg/dL — ABNORMAL LOW (ref 8.9–10.3)
Chloride: 103 mmol/L (ref 98–111)
Creatinine, Ser: 0.71 mg/dL (ref 0.44–1.00)
GFR, Estimated: >60 mL/min (ref >60)
Glucose, Bld: 113 mg/dL — ABNORMAL HIGH (ref 70–99)
Potassium: 2.9 mmol/L — ABNORMAL LOW (ref 3.5–5.1)
Sodium: 137 mmol/L (ref 135–145)
Total Bilirubin: 0.4 mg/dL (ref <1.2)
Total Protein: 6.8 g/dL (ref 6.5–8.1)

## 2023-09-08 LAB — HCG, SERUM, QUALITATIVE: Preg, Serum: NEGATIVE

## 2023-09-08 LAB — ACETAMINOPHEN LEVEL: Acetaminophen (Tylenol), Serum: <10 ug/mL — ABNORMAL LOW (ref 10–30)

## 2023-09-08 LAB — ETHANOL: Alcohol, Ethyl (B): <10 mg/dL (ref <10)

## 2023-09-08 LAB — SALICYLATE LEVEL: Salicylate Lvl: <7 mg/dL — ABNORMAL LOW (ref 7.0–30.0)

## 2023-09-08 MED ORDER — ONDANSETRON 4 MG PO TBDP
4.0000 mg | ORAL_TABLET | Freq: Once | ORAL | Status: AC
  Administered 2023-09-08: 4 mg via ORAL
  Filled 2023-09-08: qty 1

## 2023-09-08 MED ORDER — KETOROLAC TROMETHAMINE 15 MG/ML IJ SOLN: 15.0000 mg | Freq: Once | INTRAMUSCULAR | Status: DC

## 2023-09-08 MED ORDER — KETOROLAC TROMETHAMINE 15 MG/ML IJ SOLN
15.0000 mg | Freq: Once | INTRAMUSCULAR | Status: AC
  Administered 2023-09-08: 15 mg via INTRAMUSCULAR
  Filled 2023-09-08: qty 1

## 2023-09-08 MED ORDER — POTASSIUM CHLORIDE CRYS ER 20 MEQ PO TBCR
40.0000 meq | EXTENDED_RELEASE_TABLET | Freq: Once | ORAL | Status: DC
Start: 2023-09-08 — End: 2023-09-08

## 2023-09-08 NOTE — ED Triage Notes (Signed)
Pt arrived c/o generalized chest discomfort. Pt recently discharged and reports chest discomfort began after she left. Pt presents in NAD.

## 2023-09-08 NOTE — ED Notes (Addendum)
Pt dressed out into burgundy scrubs, all valuables placed in 2 personal belonging bags and put in locker 1. Wanded by security at this time.

## 2023-09-08 NOTE — ED Notes (Signed)
Pts mother called to inform this RN that pt is not to come back to her house as pts kids have been taken away from her and is not to be around them.

## 2023-09-08 NOTE — ED Notes (Signed)
Pt given both belonging bags containing all valuables

## 2023-09-08 NOTE — ED Provider Notes (Addendum)
[place] EMERGENCY DEPARTMENT AT [facility] Provider Note   CSN: [id number] Arrival date & time: 09/08/23  1634     History  Chief Complaint  Patient presents with   detox    Jacqueline Ellison is a 27 y.o. female.  She has PMH of substance use disorder, depression.  Presents to the ER today seeking resources for treatment for her chronic fentanyl abuse.  She was seen yesterday for the same and was given resources.  She does when she left she went to otalgia, was with friends and use fentanyl again.  She went to see her children this morning for Christmas and states her family brought her back here, she states she is wanting to go to rehab for her opiate addiction.  She has tried Suboxone in the past and states she felt like it worsened her symptoms.  HPI     Home Medications Prior to Admission medications   Medication Sig Start Date End Date Taking? Authorizing Provider  dicyclomine (BENTYL) 20 MG tablet Take 1 tablet (20 mg total) by mouth 2 (two) times daily. 09/07/23   Henderly, Britni A, PA-C  fentaNYL (SUBLIMAZE) 500 MCG/10ML injection Inject 500 mcg into the vein as needed for pain.    [provider]  hydrOXYzine (ATARAX) 25 MG tablet Take 1 tablet (25 mg total) by mouth every 6 (six) hours. 09/07/23   Henderly, Britni A, PA-C  naloxone (NARCAN) nasal spray 4 mg/0.1 mL Place 1 spray into the nose as needed. Patient not taking: Reported on 06/17/2023 06/17/23   Carmel Sacramento A, PA-C  ondansetron (ZOFRAN-ODT) 4 MG disintegrating tablet Take 1 tablet (4 mg total) by mouth every 8 (eight) hours as needed for nausea or vomiting. 09/07/23   Henderly, Britni A, PA-C      Allergies    Depo-provera [medroxyprogesterone acetate], Medroxyprogesterone acetate, Penicillins, and Tylenol [acetaminophen]    Review of Systems   Review of Systems  Physical Exam Updated Vital Signs BP (!) 135/95 (BP Location: Right Arm)   Pulse (!) 102   Temp 98.8 F (37.1 C)  (Oral)   Resp 18   Ht 5\' 1"  (1.549 m)   Wt 54.4 kg   LMP  (Within Months)   SpO2 100%   BMI 22.67 kg/m  Physical Exam Vitals and nursing note reviewed.  Constitutional:      General: She is not in acute distress.    Appearance: She is well-developed.  HENT:     Head: Normocephalic and atraumatic.     Mouth/Throat:     Mouth: Mucous membranes are moist.  Eyes:     Conjunctiva/sclera: Conjunctivae normal.  Cardiovascular:     Rate and Rhythm: Normal rate and regular rhythm.     Heart sounds: No murmur heard. Pulmonary:     Effort: Pulmonary effort is normal. No respiratory distress.     Breath sounds: Normal breath sounds.  Abdominal:     Palpations: Abdomen is soft.     Tenderness: There is no abdominal tenderness.  Musculoskeletal:        General: No swelling.     Cervical back: Neck supple.  Skin:    General: Skin is warm and dry.     Capillary Refill: Capillary refill takes less than 2 seconds.  Neurological:     General: No focal deficit present.     Mental Status: She is alert and oriented to person, place, and time.  Psychiatric:        Mood  and Affect: Mood normal.     ED Results / Procedures / Treatments   Labs (all labs ordered are listed, but only abnormal results are displayed) Labs Reviewed - No data to display  EKG None  Radiology No results found.  Procedures Procedures    Medications Ordered in ED Medications  ondansetron (ZOFRAN-ODT) disintegrating tablet 4 mg (4 mg Oral Given 09/08/23 1726)  ketorolac (TORADOL) 15 MG/ML injection 15 mg (15 mg Intramuscular Given 09/08/23 1726)    ED Course/ Medical Decision Making/ A&P                                 Medical Decision Making Differential diagnosis includes substance abuse disorder, anxiety, depression, opiate withdrawal, other  ED course: Patient here seeking treatment for fentanyl abuse.  Was seen yesterday for the same and given resources.  She is seeking inpatient placement.  She  denies SI or HI, she is lucid.  Discussed this is not typically something we do from here and she was given resources in the form of a resource guide and also a physical pamphlet from [facility] with [facility] recovery services substance abuse intake line phone number circled.  I also placed a consult with social work to follow-up with the patient regarding help with resources if she does not make it to be hike or [facility] recovery services on her own.  She was prescribed medications yesterday to help with her withdrawal symptoms once they start.  She declines offer of Suboxone, though she would need to wait before starting as she is having very mild  symptoms. COWS score is 4 at this time which is listed at no active withdrawal.   Risk Prescription drug management.           Final Clinical Impression(s) / ED Diagnoses Final diagnoses:  Substance use disorder    Rx / DC Orders ED Discharge Orders     None         Ma Rings, PA-C 09/08/23 1741    Ma Rings, PA-C 09/08/23 1755    Lonell Grandchild, MD 09/08/23 2244

## 2023-09-08 NOTE — ED Triage Notes (Signed)
Pt reports:  "Keep trying to go to rehab" Fentanyl  Last use yesterday "I just want some help"

## 2023-09-08 NOTE — ED Provider Notes (Addendum)
[place] EMERGENCY DEPARTMENT AT [facility] Provider Note  CSN: [id number] Arrival date & time: 09/08/23 1854  Chief Complaint(s) Suicidal  HPI Jacqueline Ellison is a 27 y.o. female with history of opioid use disorder presenting to the emergency department with suicidal ideation.  Patient reports that she is suicidal.  She has no specific plan.  She reports this is in the setting of requesting detox.  She has been in the emergency department multiple times for this.  She has been counseled to follow-up at the outpatient behavioral health center but seems frustrated that we have not been able to arrange anything else.  Otherwise denies medical complaints, chest pain, shortness of breath, abdominal pain, painful urination, other symptoms.  Denies hallucinations or homicidal ideation.   Past Medical History Past Medical History:  Diagnosis Date   Anxiety    Arthritis    Chlamydia infection    Hepatitis C 07/16/2017   Marijuana use 01/14/2016   Counseled 01/14/2016    Repeat:___   Mental disorder    depression   Patient Active Problem List   Diagnosis Date Noted   Opiate withdrawal (HCC) 06/19/2023   Opioid dependence with withdrawal (HCC) 06/18/2023   Benzodiazepine dependence (HCC) 06/18/2023   History of preterm delivery 08/06/2021   S/P repeat low transverse C-section 07/24/2021   Status post bilateral salpingectomy 07/24/2021   Rubella non-immune status, antepartum 07/02/2021   Previous cesarean section 07/02/2018   Hepatitis C 07/16/2017   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 Authorizing Provider  naloxone [facility]) nasal spray 4 mg/0.1 mL Place 1 spray into the nose as needed. 06/17/23  Yes Beatty, Celeste A, PA-C  dicyclomine (BENTYL) 20 MG tablet Take 1 tablet (20 mg  total) by mouth 2 (two) times daily. 09/07/23   Henderly, Britni A, PA-C  fentaNYL (SUBLIMAZE) 500 MCG/10ML injection Inject 500 mcg into the vein as needed for pain.    [provider]  hydrOXYzine (ATARAX) 25 MG tablet Take 1 tablet (25 mg total) by mouth every 6 (six) hours. 09/07/23   Henderly, Britni A, PA-C  ondansetron (ZOFRAN-ODT) 4 MG disintegrating tablet Take 1 tablet (4 mg total) by mouth every 8 (eight) hours as needed for nausea or vomiting. 09/07/23   Henderly, Britni A, PA-C                                                                                                                                    Past Surgical History Past Surgical History:  Procedure Laterality Date   BLADDER SURGERY     bladder stretch 2001?   CESAREAN SECTION N/A 04/25/2016   Procedure: CESAREAN SECTION;  Surgeon: Reva Bores, MD;  Location: [facility] BIRTHING SU[facility];  Service: Obstetrics;  Laterality: N/A;   CESAREAN SECTION N/A 07/24/2021   Procedure: CESAREAN SECTION;  Surgeon: Catalina Antigua, MD;  Location: [facility] LD ORS;  Service: Obstetrics;  Laterality: N/A;   HERNIA REPAIR     HERNIA REPAIR  2009   WISDOM TOOTH EXTRACTION  2009   Family History Family History  Problem Relation Age of Onset   Hypertension Paternal Grandfather    Heart disease Paternal Grandfather    Hypertension Paternal Grandmother    Hypertension Maternal Grandmother    Diabetes Maternal Grandmother    Hypertension Maternal Grandfather    Aneurysm Maternal Grandfather    Hypertension Father    Hypertension Mother    Diabetes Mother    Hypertension Sister    Heart attack Maternal Uncle     Social History Social History   Tobacco Use   Smoking status: Every Day    Current packs/day: 0.00    Average packs/day: 0.3 packs/day for 5.0 years (1.3 ttl pk-yrs)    Types: Cigarettes    Start date: 06/15/2016    Last attempt to quit: 06/15/2021    Years since quitting: 2.2   Smokeless tobacco: Never  Vaping Use    Vaping status: Former  Substance Use Topics   Alcohol use: Yes    Comment: rarely   Drug use: Yes    Comment: fentanyl. Pt reports "I use about everything" as of 09/07/23   Allergies Depo-provera [medroxyprogesterone acetate], Medroxyprogesterone acetate, Penicillins, and Tylenol [acetaminophen]  Review of Systems Review of Systems  All other systems reviewed and are negative.   Physical Exam Vital Signs  I have reviewed the triage vital signs BP (!) 132/91 (BP Location: Right Arm)   Pulse 93   Temp 98.2 F (36.8 C) (Oral)   Resp 18   Ht 5\' 1"  (1.549 m)   Wt 54.4 kg   LMP  (Within Months)   SpO2 98%   BMI 22.67 kg/m  Physical Exam Vitals and nursing note reviewed.  Constitutional:      General: She is not in acute distress.    Appearance: She is well-developed.  HENT:     Head: Normocephalic and atraumatic.     Mouth/Throat:     Mouth: Mucous membranes are moist.  Eyes:     Pupils: Pupils are equal, round, and reactive to light.  Cardiovascular:     Rate and Rhythm: Normal rate and regular rhythm.     Heart sounds: No murmur heard. Pulmonary:     Effort: Pulmonary effort is normal. No respiratory distress.     Breath sounds: Normal breath sounds.  Abdominal:     General: Abdomen is flat.     Palpations: Abdomen is soft.     Tenderness: There is no abdominal tenderness.  Musculoskeletal:        General: No tenderness.     Right lower leg: No edema.     Left lower leg: No edema.  Skin:    General: Skin is warm and dry.  Neurological:     General: No focal deficit present.     Mental Status: She is alert. Mental status is at baseline.  Psychiatric:        Mood and Affect: Mood normal.        Behavior: Behavior normal.     ED Results and Treatments Labs (all labs ordered are listed, but only abnormal results are displayed) Labs Reviewed  COMPREHENSIVE METABOLIC PANEL - Abnormal; Notable for the following components:  Result Value   Potassium 2.9  (*)    Glucose, Bld 113 (*)    Calcium 8.7 (*)    Albumin 3.4 (*)    AST 71 (*)    ALT 132 (*)    All other components within normal limits  SALICYLATE LEVEL - Abnormal; Notable for the following components:   Salicylate Lvl <7.0 (*)    All other components within normal limits  ACETAMINOPHEN LEVEL - Abnormal; Notable for the following components:   Acetaminophen (Tylenol), Serum <10 (*)    All other components within normal limits  ETHANOL  CBC WITH DIFFERENTIAL/PLATELET  HCG, SERUM, QUALITATIVE  RAPID URINE DRUG SCREEN, HOSP PERFORMED  POC URINE PREG, ED                                                                                                                          Radiology No results found.  Pertinent labs & imaging results that were available during my care of the patient were reviewed by me and considered in my medical decision making (see MDM for details).  Medications Ordered in ED Medications  potassium chloride SA (KLOR-CON M) CR tablet 40 mEq (40 mEq Oral Patient Refused/Not Given 09/08/23 2154)                                                                                                                                     Procedures Procedures  (including critical care time)  Medical Decision Making / ED Course   MDM:  27 year old female presenting to the emergency department with reported suicidal thoughts.  Patient well-appearing, physical examination unremarkable.  Will obtain medical clearance labs.  So far, labs with improving LFTs when compared to yesterday.  Not reacting to internal stimuli.  Suspect that patient may have component of secondary gain, she primarily wants [place] to send her to an inpatient substance abuse treatment facility, but given numerous presentations will likely consult TTS for psychiatric clearance.  Clinical Course as of 09/08/23 2201  Tue Sep 08, 2023  2137 Medical workup with improving LFTs from yesterday.  Also with  mild hypokalemia which will be repleted.  Pregnancy negative.  Patient otherwise medically cleared for psychiatric evaluation. [WS]  2157 Patient request that she can be discharged.  She reports that "we are not doing anything for her".  She would like Ativan.  She reports she is no longer  feeling suicidal and has no other psychiatric complaints.  I recommended that she follow-up with the behavioral urgent care and return if she has any recurrent suicidal thoughts. I have a low concern for acute psychiatric emergency at this time.  [WS]    Clinical Course User Index [WS] Lonell Grandchild, MD     Additional history obtained:  -External records from outside source obtained and reviewed including: Chart review including previous notes, labs, imaging, consultation notes including previous ER visits rrequesting detox    Lab Tests: -I ordered, reviewed, and interpreted labs.   The pertinent results include:   Labs Reviewed  COMPREHENSIVE METABOLIC PANEL - Abnormal; Notable for the following components:      Result Value   Potassium 2.9 (*)    Glucose, Bld 113 (*)    Calcium 8.7 (*)    Albumin 3.4 (*)    AST 71 (*)    ALT 132 (*)    All other components within normal limits  SALICYLATE LEVEL - Abnormal; Notable for the following components:   Salicylate Lvl <7.0 (*)    All other components within normal limits  ACETAMINOPHEN LEVEL - Abnormal; Notable for the following components:   Acetaminophen (Tylenol), Serum <10 (*)    All other components within normal limits  ETHANOL  CBC WITH DIFFERENTIAL/PLATELET  HCG, SERUM, QUALITATIVE  RAPID URINE DRUG SCREEN, HOSP PERFORMED  POC URINE PREG, ED    Notable for hypokalemia. Mild abnormal LFTs improving     Medicines ordered and prescription drug management: Meds ordered this encounter  Medications   potassium chloride SA (KLOR-CON M) CR tablet 40 mEq    -I have reviewed the patients home medicines and have made adjustments as  needed  Social Determinants of Health:  Diagnosis or treatment significantly limited by social determinants of health: polysubstance abuse   Reevaluation: After the interventions noted above, I reevaluated the patient and found that their symptoms have stayed the same  Co morbidities that complicate the patient evaluation  Past Medical History:  Diagnosis Date   Anxiety    Arthritis    Chlamydia infection    Hepatitis C 07/16/2017   Marijuana use 01/14/2016   Counseled 01/14/2016    Repeat:___   Mental disorder    depression      Dispostion: Disposition decision including need for hospitalization was considered, and patient discharged    Final Clinical Impression(s) / ED Diagnoses Final diagnoses:  Polysubstance abuse (HCC)     This chart was dictated using voice recognition software.  Despite best efforts to proofread,  errors can occur which can change the documentation meaning.    Lonell Grandchild, MD 09/08/23 2138    Lonell Grandchild, MD 09/08/23 2159    Lonell Grandchild, MD 09/08/23 2201    Lonell Grandchild, MD 09/08/23 2202

## 2023-09-08 NOTE — ED Triage Notes (Signed)
Pt reports:  Suicidal thoughts "I'm having suicidal thoughts." Other comments made "I don't have anywhere to go." "I do not know what to do." "I came for help and they are not helping me."

## 2023-09-08 NOTE — ED Notes (Signed)
Patient verbalizes understanding of discharge instructions. Opportunity for questioning and answers were provided. Armband removed by staff, pt discharged from ED. Ambulated out to lobby, security notified of departure

## 2023-09-08 NOTE — ED Notes (Signed)
Pt states she wants to leave and "y'all aren't doing anything for me". Pt with pressured speech, getting agitated. Dr. Suezanne Jacquet notified

## 2023-09-08 NOTE — Discharge Instructions (Addendum)
It was a pleasure taking care of you today.  You were evaluated in the ER for opiate addiction.  You have been given resources for rehab facilities on your paperwork and pamphlet.  I have also placed a consult with social work to have them contact you and try to help you set up outpatient facilities.  You were given prescriptions yesterday to help with your nausea and anxiety.  Please use these to try to decrease your symptoms.  Also use over-the-counter medications such as Pepto-Bismol as directed on packaging to help with any diarrhea. It is important to follow-up closely with your primary care doctor and/or any specialist as discussed.  Come back to the ER if you have any new or worsening symptoms.

## 2023-09-09 ENCOUNTER — Emergency Department (HOSPITAL_COMMUNITY)
Admission: EM | Admit: 2023-09-09 | Discharge: 2023-09-09 | Disposition: A | Discharge status: Home / Self Care | Payer: Self-pay | Attending: Emergency Medicine | Admitting: Emergency Medicine

## 2023-09-09 ENCOUNTER — Emergency Department (HOSPITAL_COMMUNITY): Payer: Self-pay

## 2023-09-09 DIAGNOSIS — E876 Hypokalemia: Secondary | ICD-10-CM

## 2023-09-09 DIAGNOSIS — J189 Pneumonia, unspecified organism: Secondary | ICD-10-CM

## 2023-09-09 DIAGNOSIS — R739 Hyperglycemia, unspecified: Secondary | ICD-10-CM

## 2023-09-09 LAB — BASIC METABOLIC PANEL
Anion gap: 10 (ref 5–15)
BUN: 9 mg/dL (ref 6–20)
CO2: 23 mmol/L (ref 22–32)
Calcium: 8.6 mg/dL — ABNORMAL LOW (ref 8.9–10.3)
Chloride: 104 mmol/L (ref 98–111)
Creatinine, Ser: 0.66 mg/dL (ref 0.44–1.00)
GFR, Estimated: >60 mL/min (ref >60)
Glucose, Bld: 127 mg/dL — ABNORMAL HIGH (ref 70–99)
Potassium: 3.1 mmol/L — ABNORMAL LOW (ref 3.5–5.1)
Sodium: 137 mmol/L (ref 135–145)

## 2023-09-09 LAB — MAGNESIUM: Magnesium: 1.9 mg/dL (ref 1.7–2.4)

## 2023-09-09 MED ORDER — CEFDINIR 300 MG PO CAPS: 300.0000 mg | ORAL_CAPSULE | Freq: Two times a day (BID) | ORAL | 0 refills | Status: AC

## 2023-09-09 MED ORDER — IBUPROFEN 400 MG PO TABS
400.0000 mg | ORAL_TABLET | Freq: Once | ORAL | Status: AC
  Administered 2023-09-09: 400 mg via ORAL
  Filled 2023-09-09: qty 1

## 2023-09-09 MED ORDER — POTASSIUM CHLORIDE CRYS ER 20 MEQ PO TBCR: 20.0000 meq | EXTENDED_RELEASE_TABLET | Freq: Two times a day (BID) | ORAL | 0 refills | Status: AC

## 2023-09-09 MED ORDER — CEFDINIR 300 MG PO CAPS
300.0000 mg | ORAL_CAPSULE | Freq: Two times a day (BID) | ORAL | Status: DC
Start: 2023-09-09 — End: 2023-09-09
  Administered 2023-09-09: 300 mg via ORAL
  Filled 2023-09-09: qty 1

## 2023-09-09 MED ORDER — AZITHROMYCIN 250 MG PO TABS
250.0000 mg | ORAL_TABLET | Freq: Every day | ORAL | 0 refills | Status: AC
Start: 2023-09-09 — End: ?

## 2023-09-09 MED ORDER — POTASSIUM CHLORIDE CRYS ER 20 MEQ PO TBCR
40.0000 meq | EXTENDED_RELEASE_TABLET | Freq: Once | ORAL | Status: AC
  Administered 2023-09-09: 40 meq via ORAL
  Filled 2023-09-09: qty 2

## 2023-09-09 NOTE — ED Notes (Signed)
Pt spit out pills, unsure which meds were actually received. Dr. Preston Fleeting made aware

## 2023-09-09 NOTE — ED Notes (Addendum)
Patient verbalizes understanding of discharge instructions. Opportunity for questioning and answers were provided. Given bag of belongings. Armband removed by staff, pt discharged from ED. Ambulated out to lobby

## 2023-09-09 NOTE — ED Notes (Signed)
Pt changed out into burgundy scrubs, belongings placed in 2 bags, put in locker 1. Wanded by security

## 2023-09-09 NOTE — ED Notes (Signed)
ED Provider at bedside. 

## 2023-09-09 NOTE — ED Provider Notes (Signed)
[place] EMERGENCY DEPARTMENT AT [facility] Provider Note   CSN: [id number] Arrival date & time: 09/08/23  2342     History  Chief Complaint  Patient presents with   Medical Clearance    Jacqueline BLEHM is a 27 y.o. female.  The history is provided by the patient.  She has a history of anxiety, depression and comes in stating that her chest hurts and her abdomen hurts.  She states that her chest started hurting when she checked back in and her abdomen started hurting after she checked in.  She also endorses her chest feeling tight.  She endorses cough productive of some green sputum.  She denies fever or chills and denies nausea or vomiting.  She does endorse vague suicidal ideation without a plan.   Home Medications Prior to Admission medications   Medication Sig Start Date End Date Taking? Authorizing Provider  dicyclomine (BENTYL) 20 MG tablet Take 1 tablet (20 mg total) by mouth 2 (two) times daily. 09/07/23   Henderly, Britni A, PA-C  fentaNYL (SUBLIMAZE) 500 MCG/10ML injection Inject 500 mcg into the vein as needed for pain.    [provider]  hydrOXYzine (ATARAX) 25 MG tablet Take 1 tablet (25 mg total) by mouth every 6 (six) hours. 09/07/23   Henderly, Britni A, PA-C  naloxone (NARCAN) nasal spray 4 mg/0.1 mL Place 1 spray into the nose as needed. 06/17/23   Carmel Sacramento A, PA-C  ondansetron (ZOFRAN-ODT) 4 MG disintegrating tablet Take 1 tablet (4 mg total) by mouth every 8 (eight) hours as needed for nausea or vomiting. 09/07/23   Henderly, Britni A, PA-C      Allergies    Depo-provera [medroxyprogesterone acetate], Medroxyprogesterone acetate, Penicillins, and Tylenol [acetaminophen]    Review of Systems   Review of Systems  All other systems reviewed and are negative.   Physical Exam Updated Vital Signs BP 133/88 (BP Location: Right Arm)   Pulse 91   Temp 98.5 F (36.9 C) (Oral)   Resp 18   Ht 5\' 1"  (1.549 m)   Wt 54 kg   LMP   (Within Months)   SpO2 97%   BMI 22.49 kg/m  Physical Exam Vitals and nursing note reviewed.   27 year old female, resting comfortably and in no acute distress. Vital signs are normal. Oxygen saturation is 97%, which is normal. Head is normocephalic and atraumatic. PERRLA, EOMI. Oropharynx is clear. Neck is nontender and supple without adenopathy. Lungs are clear without rales, wheezes, or rhonchi. Chest is nontender. Heart has regular rate and rhythm without murmur. Abdomen is soft, flat, nontender. Extremities have no cyanosis or edema, full range of motion is present. Skin is warm and dry without rash. Neurologic: Mental status is normal, cranial nerves are intact, there are no motor or sensory deficits. Psychiatric: Makes good eye contact, normal affect, does not appear to be responding to internal stimuli.  ED Results / Procedures / Treatments   Labs (all labs ordered are listed, but only abnormal results are displayed) Labs Reviewed  BASIC METABOLIC PANEL - Abnormal; Notable for the following components:      Result Value   Potassium 3.1 (*)    Glucose, Bld 127 (*)    Calcium 8.6 (*)    All other components within normal limits  MAGNESIUM    EKG EKG Interpretation Date/Time:  Wednesday September 09 2023 00:40:44 EST Ventricular Rate:  66 PR Interval:  108 QRS Duration:  88 QT Interval:  400  QTC Calculation: 419 R Axis:   85  Text Interpretation: Sinus rhythm with sinus arrhythmia with short PR Otherwise normal ECG When compared with ECG of 08-Sep-2023 20:42, No significant change was found Confirmed by Dione Booze ([id number]) on 09/09/2023 12:44:42 AM  Radiology DG Chest 2 View Result Date: 09/09/2023 CLINICAL DATA:  Cough, chest discomfort EXAM: CHEST - 2 VIEW COMPARISON:  None Available. FINDINGS: Heart and mediastinal contours within normal limits. Airspace opacity area air tear early on the lateral view may be within the right middle lobe. No additional confluent  opacities seen. No effusions. No acute bony abnormality. IMPRESSION: Opacity anteriorly on the lateral view may be within the right middle lobe concerning for pneumonia. Electronically Signed   By: Charlett Nose M.D.   On: 09/09/2023 01:07    Procedures Procedures    Medications Ordered in ED Medications  cefdinir (OMNICEF) capsule 300 mg (300 mg Oral Given 09/09/23 0215)  ibuprofen (ADVIL) tablet 400 mg (400 mg Oral Given 09/09/23 0215)  potassium chloride SA (KLOR-CON M) CR tablet 40 mEq (40 mEq Oral Given 09/09/23 0215)    ED Course/ Medical Decision Making/ A&P                                 Medical Decision Making Amount and/or Complexity of Data Reviewed Labs: ordered. Radiology: ordered.  Risk Prescription drug management.   Vague complaints of chest and abdominal discomfort benign exam.  I will order an ECG and chest x-ray and screening labs.  I reviewed her past records, and this is her fourth ED visit over the last 48 hours, and had just been discharged from the ED at 2214 tonight.  I reviewed her records, and I note significant hypokalemia on 09/07/2023 and 09/04/2023.  It appears that she received 1 dose of oral potassium, which is not sufficient to manage her hypokalemia.  I am ordering an ECG and chest x-ray and magnesium level.  I am ordering another dose of oral potassium and a dose of oral acetaminophen.  I have reviewed her electrocardiogram, and my interpretation i sinus arrhythmia, no significant change from ECG of 09/04/2023.  I have reviewed her laboratory tests, and my interpretation is hypokalemia and elevated random glucose which will need to be followed as an outpatient.  Magnesium level is normal.  I have ordered a dose of cefdinir for her pneumonia and I am discharging her with prescriptions for cefdinir and azithromycin as well as oral potassium.  Final Clinical Impression(s) / ED Diagnoses Final diagnoses:  Community acquired pneumonia of right middle  lobe of lung  Hypokalemia  Elevated random blood glucose level    Rx / DC Orders ED Discharge Orders          Ordered    cefdinir (OMNICEF) 300 MG capsule  2 times daily        09/09/23 0618    azithromycin (ZITHROMAX) 250 MG tablet  Daily        09/09/23 0618    potassium chloride SA (KLOR-CON M) 20 MEQ tablet  2 times daily        09/09/23 0618              Dione Booze, MD 09/09/23 [phone]
